# Patient Record
Sex: Male | Born: 1950 | Race: White | Hispanic: No | Marital: Married | Smoking: Former smoker
Health system: Southern US, Community
[De-identification: ages and names within clinical notes are randomized; demographics above are authoritative.]

## PROBLEM LIST (undated history)

## (undated) DIAGNOSIS — R0600 Dyspnea, unspecified: Secondary | ICD-10-CM

## (undated) DIAGNOSIS — E785 Hyperlipidemia, unspecified: Secondary | ICD-10-CM

## (undated) DIAGNOSIS — H9319 Tinnitus, unspecified ear: Secondary | ICD-10-CM

## (undated) DIAGNOSIS — M1990 Unspecified osteoarthritis, unspecified site: Secondary | ICD-10-CM

## (undated) HISTORY — DX: Hyperlipidemia, unspecified: E78.5

## (undated) HISTORY — DX: Tinnitus, unspecified ear: H93.19

## (undated) HISTORY — PX: EYE SURGERY: SHX253

---

## 2019-03-19 ENCOUNTER — Ambulatory Visit: Payer: Medicare Other | Attending: Internal Medicine

## 2019-03-19 ENCOUNTER — Ambulatory Visit: Payer: Self-pay

## 2019-03-19 DIAGNOSIS — Z23 Encounter for immunization: Secondary | ICD-10-CM | POA: Insufficient documentation

## 2019-03-19 NOTE — Progress Notes (Signed)
   Covid-19 Vaccination Clinic  Name:  Paul Roth    MRN: [id number] DOB: 30-Nov-1950  03/19/2019  Paul Roth was observed post Covid-19 immunization for 15 minutes without incidence. He was provided with Vaccine Information Sheet and instruction to access the V-Safe system.   Paul Roth was instructed to call 911 with any severe reactions post vaccine: Marland Kitchen Difficulty breathing  . Swelling of your face and throat  . A fast heartbeat  . A bad rash all over your body  . Dizziness and weakness    Immunizations Administered    Name Date Dose VIS Date Route   Pfizer COVID-19 Vaccine 03/19/2019  9:30 AM 0.3 mL 01/14/2019 Intramuscular   Manufacturer: [place]   Lot: [id number]   [place]: [address]-8

## 2019-04-10 ENCOUNTER — Ambulatory Visit: Payer: Medicare Other

## 2019-04-12 ENCOUNTER — Ambulatory Visit: Payer: Medicare Other | Attending: Internal Medicine

## 2019-04-12 DIAGNOSIS — Z23 Encounter for immunization: Secondary | ICD-10-CM | POA: Insufficient documentation

## 2019-04-12 NOTE — Progress Notes (Signed)
   Covid-19 Vaccination Clinic  Name:  Lealon Vanputten    MRN: [id number] DOB: 1950/02/15  04/12/2019  Mr. Macke was observed post Covid-19 immunization for 15 minutes without incident. He was provided with Vaccine Information Sheet and instruction to access the V-Safe system.   Mr. Jares was instructed to call 911 with any severe reactions post vaccine: Marland Kitchen Difficulty breathing  . Swelling of face and throat  . A fast heartbeat  . A bad rash all over body  . Dizziness and weakness   Immunizations Administered    Name Date Dose VIS Date Route   Pfizer COVID-19 Vaccine 04/12/2019  5:43 PM 0.3 mL 01/14/2019 Intramuscular   Manufacturer: [place]   Lot: [id number]   [place]: [address]-7

## 2020-07-04 ENCOUNTER — Other Ambulatory Visit: Payer: Self-pay | Admitting: Orthopedic Surgery

## 2020-07-04 DIAGNOSIS — Z01811 Encounter for preprocedural respiratory examination: Secondary | ICD-10-CM

## 2020-07-17 ENCOUNTER — Other Ambulatory Visit: Payer: Self-pay

## 2020-07-17 ENCOUNTER — Ambulatory Visit (INDEPENDENT_AMBULATORY_CARE_PROVIDER_SITE_OTHER): Payer: Medicare Other | Admitting: Internal Medicine

## 2020-07-17 ENCOUNTER — Encounter: Payer: Self-pay | Admitting: Internal Medicine

## 2020-07-17 VITALS — BP 108/70 | HR 68 | Temp 97.2°F | Ht 74.0 in | Wt 189.2 lb

## 2020-07-17 DIAGNOSIS — R0602 Shortness of breath: Secondary | ICD-10-CM

## 2020-07-17 DIAGNOSIS — Z87891 Personal history of nicotine dependence: Secondary | ICD-10-CM | POA: Diagnosis not present

## 2020-07-17 DIAGNOSIS — J849 Interstitial pulmonary disease, unspecified: Secondary | ICD-10-CM | POA: Diagnosis not present

## 2020-07-17 NOTE — Patient Instructions (Signed)
The patient should have follow up scheduled with myself in 1 months.   Prior to next visit patient should have: Full set of PFTs, 1 hour CT scan of chest

## 2020-07-17 NOTE — Progress Notes (Signed)
Paul Roth    [id number]    [birth year]  Primary Care Physician:Bouska, Shanon Brow, MD  Referring Physician: Bernerd Limbo, MD [address],  [place] [address] Reason for Consultation: shortness of breath Date of Consultation: 07/17/2020  Chief complaint:   Chief Complaint  Patient presents with   Consult    Shortness of breath with exertion for past 5 years, worse over last year.      HPI:  Has had long standing shortness of breath which has worsened over the last 5 years. His exercise tolerance has gone down, he is unable to go running. Denies coughing but has chronic nasal drainage and occasional morning cough.   He has never had pneumonia or bronchitis, never been hospitalized for his breathing.  No fevers, chills, weight loss. Had night sweats once earlier this week but that was a particularly active day.   Is having right should replacement next week.   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   Mother had lung disease - was on oxygen smoked cigarettes and had recurrent pneumonia. ?copd? pneumothoraces Maternal aunt - copd, pneumothoraces No family history of autoimmune diseases  ILD REVIEW OF SYSTEMS No exposure to asbestos, silica or other organic allergens    -pt worked see below No birds or chickens No water damage or mold exposure in home No hot tub at home No arthralgias or myalgias Denies skin rash or lesions Denies nail changes or finger splitting + Raynaud's Denies dry eyes  +dry mouth Denies dysphagia +GERD +poor sleep PPD negative and no h/o exposure No h/o chemo/XRT/amiodarone/macrodantin/MTX Originally from [place], has lived in [place] since 1974   Denies lightheadedness, syncope or h/o seizure Denies vision changes Denies atopy or sinusitis Denies excessive thirst or urination. No h/o DM Denies wt change or heat or cold intolerance. 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  Exposures: Programmer, systems, wife Smoking history: 35 pack years  Social History   Occupational History   Not on file  Tobacco Use   Smoking status: Former    Packs/day: 1.00    Years: 35.00    Pack years: 35.00    Types: Cigarettes    Quit date: 1990    Years since quitting: 32.4   Smokeless tobacco: Never  Vaping Use   Vaping Use: Never used  Substance and Sexual Activity   Alcohol use: Not Currently   Drug use: Never   Sexual activity: Not on file    Relevant family history:  Family History  Problem Relation Age of Onset   Lung disease Mother    Lung disease Maternal Aunt     Past Medical History:  Diagnosis Date   Hyperlipidemia    Tinnitus     History reviewed. No pertinent surgical history.   Physical Exam: Blood pressure 108/70, pulse 68, temperature (!) 97.2 F (36.2 C), temperature source Temporal, height 6\' 2"  (1.88 m), weight 189 lb 3.2 oz (85.8 kg), SpO2 99 %. Gen:      No acute distress ENT:  no nasal polyps, mucus membranes moist Lungs:    No increased respiratory effort, symmetric chest wall excursion, clear to auscultation bilaterally, no wheezes or crackles  CV:         Regular rate and rhythm; no murmurs, rubs, or gallops.  No pedal edema Abd:      + bowel sounds; soft, non-tender; no distension MSK: no acute synovitis of DIP or PIP joints, no mechanics hands.  Skin:      Warm and dry; no rashes Neuro: normal speech, no focal facial asymmetry Psych: alert and oriented x3, normal mood and affect   Data Reviewed/Medical Decision Making:  Independent interpretation of tests: Imaging: - report of chest xray with fibrosis  PFTs: None available, he had spirometry  done with PCP but I don't have the results No flowsheet data found.  Labs: =CMP June 2021 shows Cr 1.1, Na 143, K 4.9 AST ALT wnl CBC June 2021 shows Hgb 15.7, Plt 222    Immunization status:  Immunization History  Administered Date(s) Administered   Influenza Whole 11/27/2012   Influenza-Unspecified 11/12/2018   PFIZER Comirnaty(Gray Top)Covid-19 Tri-Sucrose Vaccine 05/18/2020   PFIZER(Purple Top)SARS-COV-2 Vaccination 03/19/2019, 04/12/2019, 11/01/2019   Pneumococcal Conjugate-13 07/06/2019   Pneumococcal Polysaccharide-23 11/27/2012   Zoster, Live 11/27/2012     I reviewed prior external note(s) from PCP  I reviewed the result(s) of the labs and imaging as noted above.   I have ordered CT Chest, PFTs  Assessment:  Shortness of breath History of tobacco use  Plan/Recommendations:  Ok to proceed with shoulder surgery that is scheduled. I am concerned about this report of abnormal Chest xray with symptoms of worsening dyspnea. Differential includes smoking related lung disease, pulmonary fibrosis. Will proceed with PFT and CT Chest for further evaluation. I will see him back after this.   We discussed disease management and progression at length today.     Return to Care: Return in about 4 weeks (around 08/14/2020).  Lenice Llamas, MD Pulmonary and [place]  CC: Bernerd Limbo, MD

## 2020-07-18 NOTE — Progress Notes (Signed)
DUE TO COVID-19 ONLY ONE VISITOR IS ALLOWED TO COME WITH YOU AND STAY IN THE WAITING ROOM ONLY DURING PRE OP AND PROCEDURE DAY OF SURGERY. THE 1 VISITOR  MAY VISIT WITH YOU AFTER SURGERY IN YOUR PRIVATE ROOM DURING VISITING HOURS ONLY!  YOU NEED TO HAVE A COVID 19 TEST ON_______ @_______ , THIS TEST MUST BE DONE BEFORE SURGERY,  COVID TESTING SITE [address] [place] [address], IT IS ON THE RIGHT GOING OUT [address] APPROXIMATELY  2 MINUTES PAST [employer] ON THE RIGHT. ONCE YOUR COVID TEST IS COMPLETED,  PLEASE BEGIN THE QUARANTINE INSTRUCTIONS AS OUTLINED IN YOUR HANDOUT.                Paul Roth  07/18/2020   Your procedure is scheduled on:            07/26/20   Report to [facility] Main  Entrance   Report to admitting at     [place] AM     Call this number if you have problems the morning of surgery [phone]    REMEMBER: NO  SOLID FOOD CANDY OR GUM AFTER MIDNIGHT. CLEAR LIQUIDS UNTIL    0750am         . NOTHING BY MOUTH EXCEPT CLEAR LIQUIDS UNTIL    0750am    . PLEASE FINISH ENSURE DRINK PER SURGEON ORDER  WHICH NEEDS TO BE COMPLETED AT  0750am     .      CLEAR LIQUID DIET   Foods Allowed                                                                    Coffee and tea, regular and decaf                            Fruit ices (not with fruit pulp)                                      Iced Popsicles                                    Carbonated beverages, regular and diet                                    Cranberry, grape and apple juices Sports drinks like Gatorade Lightly seasoned clear broth or consume(fat free) Sugar, honey syrup ___________________________________________________________________      BRUSH YOUR TEETH MORNING OF SURGERY AND RINSE YOUR MOUTH OUT, NO CHEWING GUM CANDY OR MINTS.     Take these medicines the morning of surgery with A SIP OF WATER:   Eye drops as usual  DO NOT TAKE ANY DIABETIC MEDICATIONS DAY  OF YOUR SURGERY                               You may not have any metal on your body including  hair pins and              piercings  Do not wear jewelry, make-up, lotions, powders or perfumes, deodorant             Do not wear nail polish on your fingernails.  Do not shave  48 hours prior to surgery.              Men may shave face and neck.   Do not bring valuables to the hospital. [place].  Contacts, dentures or bridgework may not be worn into surgery.  Leave suitcase in the car. After surgery it may be brought to your room.     Patients discharged the day of surgery will not be allowed to drive home. IF YOU ARE HAVING SURGERY AND GOING HOME THE SAME DAY, YOU MUST HAVE AN ADULT TO DRIVE YOU HOME AND BE WITH YOU FOR 24 HOURS. YOU MAY GO HOME BY TAXI OR UBER OR ORTHERWISE, BUT AN ADULT MUST ACCOMPANY YOU HOME AND STAY WITH YOU FOR 24 HOURS.  Name and phone number of your driver:  Special Instructions: N/A              Please read over the following fact sheets you were given: _____________________________________________________________________  [facility] - Preparing for Surgery Before surgery, you can play an important role.  Because skin is not sterile, your skin needs to be as free of germs as possible.  You can reduce the number of germs on your skin by washing with CHG (chlorahexidine gluconate) soap before surgery.  CHG is an antiseptic cleaner which kills germs and bonds with the skin to continue killing germs even after washing. Please DO NOT use if you have an allergy to CHG or antibacterial soaps.  If your skin becomes reddened/irritated stop using the CHG and inform your nurse when you arrive at Short Stay. Do not shave (including legs and underarms) for at least 48 hours prior to the first CHG shower.  You may shave your face/neck. Please follow these instructions carefully:  1.  Shower with CHG Soap the night before surgery  and the  morning of Surgery.  2.  If you choose to wash your hair, wash your hair first as usual with your  normal  shampoo.  3.  After you shampoo, rinse your hair and body thoroughly to remove the  shampoo.                           4.  Use CHG as you would any other liquid soap.  You can apply chg directly  to the skin and wash                       Gently with a scrungie or clean washcloth.  5.  Apply the CHG Soap to your body ONLY FROM THE NECK DOWN.   Do not use on face/ open                           Wound or open sores. Avoid contact with eyes, ears mouth and genitals (private parts).                       Wash face,  Genitals (private parts) with  your normal soap.             6.  Wash thoroughly, paying special attention to the area where your surgery  will be performed.  7.  Thoroughly rinse your body with warm water from the neck down.  8.  DO NOT shower/wash with your normal soap after using and rinsing off  the CHG Soap.                9.  Pat yourself dry with a clean towel.            10.  Wear clean pajamas.            11.  Place clean sheets on your bed the night of your first shower and do not  sleep with pets. Day of Surgery : Do not apply any lotions/deodorants the morning of surgery.  Please wear clean clothes to the hospital/surgery center.  FAILURE TO FOLLOW THESE INSTRUCTIONS MAY RESULT IN THE CANCELLATION OF YOUR SURGERY PATIENT SIGNATURE_________________________________  NURSE SIGNATURE__________________________________  ________________________________________________________________________              [facility]- Preparing for Total Shoulder Arthroplasty    Before surgery, you can play an important role. Because skin is not sterile, your skin needs to be as free of germs as possible. You can reduce the number of germs on your skin by using the following products. Benzoyl Peroxide Gel Reduces the number of germs present on the skin Applied twice a day to  shoulder area starting two days before surgery    ==================================================================  Please follow these instructions carefully:  BENZOYL PEROXIDE 5% GEL  Please do not use if you have an allergy to benzoyl peroxide.   If your skin becomes reddened/irritated stop using the benzoyl peroxide.  Starting two days before surgery, apply as follows: Apply benzoyl peroxide in the morning and at night. Apply after taking a shower. If you are not taking a shower clean entire shoulder front, back, and side along with the armpit with a clean wet washcloth.  Place a quarter-sized dollop on your shoulder and rub in thoroughly, making sure to cover the front, back, and side of your shoulder, along with the armpit.   2 days before ____ AM   ____ PM              1 day before ____ AM   ____ PM                         Do this twice a day for two days.  (Last application is the night before surgery, AFTER using the CHG soap as described below).  Do NOT apply benzoyl peroxide gel on the day of surgery.

## 2020-07-20 ENCOUNTER — Other Ambulatory Visit: Payer: Self-pay

## 2020-07-20 ENCOUNTER — Encounter (HOSPITAL_COMMUNITY): Payer: Self-pay

## 2020-07-20 ENCOUNTER — Encounter (HOSPITAL_COMMUNITY)
Admission: RE | Admit: 2020-07-20 | Discharge: 2020-07-20 | Disposition: A | Discharge status: Home / Self Care | Payer: Medicare Other | Source: Ambulatory Visit | Attending: Orthopedic Surgery | Admitting: Orthopedic Surgery

## 2020-07-20 ENCOUNTER — Ambulatory Visit (HOSPITAL_COMMUNITY)
Admission: RE | Admit: 2020-07-20 | Discharge: 2020-07-20 | Disposition: A | Discharge status: Home / Self Care | Payer: Medicare Other | Source: Ambulatory Visit | Attending: Orthopedic Surgery | Admitting: Orthopedic Surgery

## 2020-07-20 DIAGNOSIS — Z87891 Personal history of nicotine dependence: Secondary | ICD-10-CM | POA: Diagnosis not present

## 2020-07-20 DIAGNOSIS — M19011 Primary osteoarthritis, right shoulder: Secondary | ICD-10-CM | POA: Diagnosis not present

## 2020-07-20 DIAGNOSIS — Z79899 Other long term (current) drug therapy: Secondary | ICD-10-CM | POA: Insufficient documentation

## 2020-07-20 DIAGNOSIS — Z01811 Encounter for preprocedural respiratory examination: Secondary | ICD-10-CM

## 2020-07-20 DIAGNOSIS — Z01818 Encounter for other preprocedural examination: Secondary | ICD-10-CM | POA: Insufficient documentation

## 2020-07-20 HISTORY — DX: Unspecified osteoarthritis, unspecified site: M19.90

## 2020-07-20 HISTORY — DX: Dyspnea, unspecified: R06.00

## 2020-07-20 LAB — SURGICAL PCR SCREEN
MRSA, PCR: NEGATIVE
Staphylococcus aureus: POSITIVE — AB

## 2020-07-20 LAB — COMPREHENSIVE METABOLIC PANEL
ALT: 24 U/L (ref 0–44)
AST: 22 U/L (ref 15–41)
Albumin: 3.6 g/dL (ref 3.5–5.0)
Alkaline Phosphatase: 55 U/L (ref 38–126)
Anion gap: 8 (ref 5–15)
BUN: 13 mg/dL (ref 8–23)
CO2: 29 mmol/L (ref 22–32)
Calcium: 9.5 mg/dL (ref 8.9–10.3)
Chloride: 100 mmol/L (ref 98–111)
Creatinine, Ser: 1.19 mg/dL (ref 0.61–1.24)
GFR, Estimated: >60 mL/min (ref >60)
Glucose, Bld: 102 mg/dL — ABNORMAL HIGH (ref 70–99)
Potassium: 4.2 mmol/L (ref 3.5–5.1)
Sodium: 137 mmol/L (ref 135–145)
Total Bilirubin: 0.6 mg/dL (ref 0.3–1.2)
Total Protein: 6.9 g/dL (ref 6.5–8.1)

## 2020-07-20 LAB — APTT: aPTT: 34 seconds (ref 24–36)

## 2020-07-20 NOTE — Progress Notes (Addendum)
Anesthesia Review:  PCP: DR Bernerd Limbo LOv 07/09/20 - cleared for surgery  Cardiologist : none  Pulmonary- Dr Shearon Stalls - 07/16/2020- clearance fo rsurgery  Has CT of chest scheduled for 08/08/20  Chest x-ray : EKG : 07/09/20 requestd 12 lead ekg tracing from office of DR Coletta Memos by fax  Echo : Stress test: Cardiac Cath :  Activity level: can do a flight of stairs slowly per pt  Sleep Study/ CPAP : none  Fasting Blood Sugar :      / Checks Blood Sugar -- times a day:   Blood Thinner/ Instructions /Last Dose: ASA / Instructions/ Last Dose :   SOB with exertion  per pt  CBc/Diff and U/A done on 07/09/20 in epic.

## 2020-07-24 NOTE — Progress Notes (Signed)
Anesthesia Chart Review   Case: [id number] Date/Time: 07/26/20 1051   Procedure: TOTAL SHOULDER ARTHROPLASTY (Right: Shoulder)   Anesthesia type: Choice   Pre-op diagnosis: RIGHT SHOULDER ARTHRITIS   Location: [facility] ROOM 07 / [facility]   Surgeons: Tania Ade, MD       DISCUSSION:69 y.o. former smoker with h/o right shoulder arthritis scheduled for above procedure 07/26/2020 with Dr. Tania Ade.   Pt seen by pulmonology for evaluation of chronic shortness of breath.  Per pulmonology note, "Ok to proceed with shoulder surgery that is scheduled."  Cleared by PCP 07/09/2020.  VS: BP 128/83   Pulse 83   Temp 36.9 C (Oral)   Resp 16   Ht 6\' 1"  (1.854 m)   Wt 82.1 kg   SpO2 98%   BMI 23.88 kg/m   PROVIDERS: Bernerd Limbo, MD is PCP   Lenice Llamas, MD is Pulmonologist  LABS: Labs reviewed: Acceptable for surgery. (all labs ordered are listed, but only abnormal results are displayed)  Labs Reviewed  SURGICAL PCR SCREEN - Abnormal; Notable for the following components:      Result Value   Staphylococcus aureus POSITIVE (*)    All other components within normal limits  COMPREHENSIVE METABOLIC PANEL - Abnormal; Notable for the following components:   Glucose, Bld 102 (*)    All other components within normal limits  APTT  TYPE AND SCREEN     IMAGES: Chest Xray 07/20/2020 IMPRESSION: 1. Chronic changes versus less likely developing infiltrate in the left upper lobe. 2. Focal area of somewhat nodular density along the lateral right pleural surface.  EKG:   CV:  Past Medical History:  Diagnosis Date   Arthritis    Dyspnea    Hyperlipidemia    Tinnitus     Past Surgical History:  Procedure Laterality Date   EYE SURGERY      MEDICATIONS:  ascorbic acid (VITAMIN C) 100 MG tablet   atorvastatin (LIPITOR) 10 MG tablet   MAGNESIUM CITRATE PO   meloxicam (MOBIC) 15 MG tablet   nortriptyline (PAMELOR) 75 MG capsule   prednisoLONE acetate (PRED FORTE) 1 %  ophthalmic suspension   VITAMIN D, CHOLECALCIFEROL, PO   No current facility-administered medications for this encounter.    Konrad Felix, PA-C [facility] Pre-Surgical Testing [phone]

## 2020-07-26 ENCOUNTER — Encounter (HOSPITAL_COMMUNITY): Payer: Self-pay | Admitting: Orthopedic Surgery

## 2020-07-26 ENCOUNTER — Ambulatory Visit (HOSPITAL_COMMUNITY)
Admission: RE | Admit: 2020-07-26 | Discharge: 2020-07-26 | Disposition: A | Discharge status: Home / Self Care | Payer: Medicare Other | Attending: Orthopedic Surgery | Admitting: Orthopedic Surgery

## 2020-07-26 ENCOUNTER — Ambulatory Visit (HOSPITAL_COMMUNITY): Payer: Medicare Other | Admitting: Anesthesiology

## 2020-07-26 ENCOUNTER — Encounter (HOSPITAL_COMMUNITY)
Admission: RE | Disposition: A | Discharge status: Home / Self Care | Payer: Self-pay | Source: Home / Self Care | Attending: Orthopedic Surgery

## 2020-07-26 ENCOUNTER — Ambulatory Visit (HOSPITAL_COMMUNITY): Payer: Medicare Other

## 2020-07-26 ENCOUNTER — Ambulatory Visit (HOSPITAL_COMMUNITY): Payer: Medicare Other | Admitting: Physician Assistant

## 2020-07-26 DIAGNOSIS — M25711 Osteophyte, right shoulder: Secondary | ICD-10-CM | POA: Diagnosis not present

## 2020-07-26 DIAGNOSIS — Z791 Long term (current) use of non-steroidal anti-inflammatories (NSAID): Secondary | ICD-10-CM | POA: Diagnosis not present

## 2020-07-26 DIAGNOSIS — Z79899 Other long term (current) drug therapy: Secondary | ICD-10-CM | POA: Diagnosis not present

## 2020-07-26 DIAGNOSIS — Z96612 Presence of left artificial shoulder joint: Secondary | ICD-10-CM

## 2020-07-26 DIAGNOSIS — M19011 Primary osteoarthritis, right shoulder: Secondary | ICD-10-CM | POA: Insufficient documentation

## 2020-07-26 DIAGNOSIS — Z87891 Personal history of nicotine dependence: Secondary | ICD-10-CM | POA: Diagnosis not present

## 2020-07-26 HISTORY — PX: TOTAL SHOULDER ARTHROPLASTY: SHX126

## 2020-07-26 LAB — CBC WITH DIFFERENTIAL/PLATELET
Abs Immature Granulocytes: 0.04 10*3/uL (ref 0.00–0.07)
Basophils Absolute: 0.1 10*3/uL (ref 0.0–0.1)
Basophils Relative: 1 %
Eosinophils Absolute: 0.4 10*3/uL (ref 0.0–0.5)
Eosinophils Relative: 4 %
HCT: 47.2 % (ref 39.0–52.0)
Hemoglobin: 15.7 g/dL (ref 13.0–17.0)
Immature Granulocytes: 0 %
Lymphocytes Relative: 32 %
Lymphs Abs: 3 10*3/uL (ref 0.7–4.0)
MCH: 31.7 pg (ref 26.0–34.0)
MCHC: 33.3 g/dL (ref 30.0–36.0)
MCV: 95.4 fL (ref 80.0–100.0)
Monocytes Absolute: 1.1 10*3/uL — ABNORMAL HIGH (ref 0.1–1.0)
Monocytes Relative: 12 %
Neutro Abs: 4.9 10*3/uL (ref 1.7–7.7)
Neutrophils Relative %: 51 %
Platelets: 230 10*3/uL (ref 150–400)
RBC: 4.95 MIL/uL (ref 4.22–5.81)
RDW: 13.2 % (ref 11.5–15.5)
WBC: 9.6 10*3/uL (ref 4.0–10.5)
nRBC: 0 % (ref 0.0–0.2)

## 2020-07-26 LAB — TYPE AND SCREEN
ABO/RH(D): A POS
Antibody Screen: NEGATIVE

## 2020-07-26 LAB — ABO/RH: ABO/RH(D): A POS

## 2020-07-26 SURGERY — ARTHROPLASTY, SHOULDER, TOTAL
Anesthesia: Regional | Site: Shoulder | Laterality: Right

## 2020-07-26 MED ORDER — FENTANYL CITRATE (PF) 100 MCG/2ML IJ SOLN
50.0000 ug | Freq: Once | INTRAMUSCULAR | Status: AC
  Administered 2020-07-26: 50 ug via INTRAVENOUS
  Filled 2020-07-26: qty 2

## 2020-07-26 MED ORDER — WATER FOR IRRIGATION, STERILE IR SOLN
Status: DC | PRN
  Administered 2020-07-26: 2000 mL

## 2020-07-26 MED ORDER — LIDOCAINE 2% (20 MG/ML) 5 ML SYRINGE
INTRAMUSCULAR | Status: AC
  Filled 2020-07-26: qty 5

## 2020-07-26 MED ORDER — DEXAMETHASONE SODIUM PHOSPHATE 10 MG/ML IJ SOLN
INTRAMUSCULAR | Status: AC
  Filled 2020-07-26: qty 1

## 2020-07-26 MED ORDER — MIDAZOLAM HCL 2 MG/2ML IJ SOLN
1.0000 mg | Freq: Once | INTRAMUSCULAR | Status: AC
  Administered 2020-07-26: 2 mg via INTRAVENOUS
  Filled 2020-07-26: qty 2

## 2020-07-26 MED ORDER — 0.9 % SODIUM CHLORIDE (POUR BTL) OPTIME
TOPICAL | Status: DC | PRN
  Administered 2020-07-26: 1000 mL

## 2020-07-26 MED ORDER — TRANEXAMIC ACID-NACL 1000-0.7 MG/100ML-% IV SOLN
1000.0000 mg | INTRAVENOUS | Status: AC
  Administered 2020-07-26: 1000 mg via INTRAVENOUS
  Filled 2020-07-26: qty 100

## 2020-07-26 MED ORDER — FENTANYL CITRATE (PF) 100 MCG/2ML IJ SOLN
INTRAMUSCULAR | Status: DC | PRN
  Administered 2020-07-26 (×2): 25 ug via INTRAVENOUS
  Administered 2020-07-26: 50 ug via INTRAVENOUS

## 2020-07-26 MED ORDER — SUGAMMADEX SODIUM 200 MG/2ML IV SOLN
INTRAVENOUS | Status: DC | PRN
  Administered 2020-07-26: 200 mg via INTRAVENOUS

## 2020-07-26 MED ORDER — PROPOFOL 10 MG/ML IV BOLUS
INTRAVENOUS | Status: DC | PRN
  Administered 2020-07-26: 140 mg via INTRAVENOUS

## 2020-07-26 MED ORDER — ROCURONIUM BROMIDE 10 MG/ML (PF) SYRINGE
PREFILLED_SYRINGE | INTRAVENOUS | Status: DC | PRN
  Administered 2020-07-26: 60 mg via INTRAVENOUS

## 2020-07-26 MED ORDER — ACETAMINOPHEN 500 MG PO TABS
1000.0000 mg | ORAL_TABLET | Freq: Once | ORAL | Status: AC
  Administered 2020-07-26: 1000 mg via ORAL
  Filled 2020-07-26: qty 2

## 2020-07-26 MED ORDER — CEFAZOLIN SODIUM-DEXTROSE 2-4 GM/100ML-% IV SOLN
2.0000 g | INTRAVENOUS | Status: AC
  Administered 2020-07-26: 2 g via INTRAVENOUS
  Filled 2020-07-26: qty 100

## 2020-07-26 MED ORDER — ONDANSETRON HCL 4 MG/2ML IJ SOLN
INTRAMUSCULAR | Status: DC | PRN
  Administered 2020-07-26: 4 mg via INTRAVENOUS

## 2020-07-26 MED ORDER — TIZANIDINE HCL 2 MG PO TABS: 2.0000 mg | ORAL_TABLET | Freq: Three times a day (TID) | ORAL | 0 refills | Status: DC | PRN

## 2020-07-26 MED ORDER — LACTATED RINGERS IV SOLN: INTRAVENOUS | Status: DC | PRN

## 2020-07-26 MED ORDER — LIDOCAINE 2% (20 MG/ML) 5 ML SYRINGE
INTRAMUSCULAR | Status: DC | PRN
  Administered 2020-07-26: 60 mg via INTRAVENOUS

## 2020-07-26 MED ORDER — BUPIVACAINE HCL (PF) 0.5 % IJ SOLN
INTRAMUSCULAR | Status: DC | PRN
  Administered 2020-07-26: 15 mL via PERINEURAL

## 2020-07-26 MED ORDER — SODIUM CHLORIDE 0.9 % IR SOLN
Status: DC | PRN
  Administered 2020-07-26: 1000 mL

## 2020-07-26 MED ORDER — BUPIVACAINE LIPOSOME 1.3 % IJ SUSP
INTRAMUSCULAR | Status: DC | PRN
  Administered 2020-07-26: 10 mL via PERINEURAL

## 2020-07-26 MED ORDER — ROCURONIUM BROMIDE 10 MG/ML (PF) SYRINGE
PREFILLED_SYRINGE | INTRAVENOUS | Status: AC
  Filled 2020-07-26: qty 10

## 2020-07-26 MED ORDER — ONDANSETRON HCL 4 MG/2ML IJ SOLN
INTRAMUSCULAR | Status: AC
  Filled 2020-07-26: qty 2

## 2020-07-26 MED ORDER — FENTANYL CITRATE (PF) 100 MCG/2ML IJ SOLN: 25.0000 ug | INTRAMUSCULAR | Status: DC | PRN

## 2020-07-26 MED ORDER — OXYCODONE-ACETAMINOPHEN 5-325 MG PO TABS: 1.0000 | ORAL_TABLET | Freq: Four times a day (QID) | ORAL | 0 refills | Status: AC | PRN

## 2020-07-26 MED ORDER — DEXAMETHASONE SODIUM PHOSPHATE 10 MG/ML IJ SOLN
INTRAMUSCULAR | Status: DC | PRN
  Administered 2020-07-26: 10 mg via INTRAVENOUS

## 2020-07-26 MED ORDER — FENTANYL CITRATE (PF) 100 MCG/2ML IJ SOLN
INTRAMUSCULAR | Status: AC
  Filled 2020-07-26: qty 2

## 2020-07-26 SURGICAL SUPPLY — 71 items
AID PSTN UNV HD RSTRNT DISP (MISCELLANEOUS) ×1
BAG SPEC THK2 15X12 ZIP CLS (MISCELLANEOUS) ×1
BAG ZIPLOCK 12X15 (MISCELLANEOUS) ×3 IMPLANT
BIT DRILL 1.6MX128 (BIT) ×2 IMPLANT
BIT DRILL 1.6MX128MM (BIT) ×1
BLADE SAW SAG 73X25 THK (BLADE) ×2
BLADE SAW SGTL 73X25 THK (BLADE) ×1 IMPLANT
CEMENT BONE DEPUY (Cement) ×3 IMPLANT
CLOSURE STERI-STRIP 1/2X4 (GAUZE/BANDAGES/DRESSINGS) ×1
CLOSURE WOUND 1/2 X4 (GAUZE/BANDAGES/DRESSINGS) ×1
CLSR STERI-STRIP ANTIMIC 1/2X4 (GAUZE/BANDAGES/DRESSINGS) ×1 IMPLANT
COOLER ICEMAN CLASSIC (MISCELLANEOUS) IMPLANT
COVER BACK TABLE 60X90IN (DRAPES) ×3 IMPLANT
COVER SURGICAL LIGHT HANDLE (MISCELLANEOUS) ×3 IMPLANT
COVER WAND RF STERILE (DRAPES) IMPLANT
DRAPE INCISE IOBAN 66X45 STRL (DRAPES) ×3 IMPLANT
DRAPE ORTHO SPLIT 77X108 STRL (DRAPES) ×6
DRAPE POUCH INSTRU U-SHP 10X18 (DRAPES) ×3 IMPLANT
DRAPE SURG 17X11 SM STRL (DRAPES) ×3 IMPLANT
DRAPE SURG ORHT 6 SPLT 77X108 (DRAPES) ×2 IMPLANT
DRAPE TOP 10253 STERILE (DRAPES) ×3 IMPLANT
DRAPE U-SHAPE 47X51 STRL (DRAPES) ×3 IMPLANT
DRSG AQUACEL AG ADV 3.5X 6 (GAUZE/BANDAGES/DRESSINGS) ×3 IMPLANT
DURAPREP 26ML APPLICATOR (WOUND CARE) ×6 IMPLANT
ELECT BLADE TIP CTD 4 INCH (ELECTRODE) ×5 IMPLANT
ELECT REM PT RETURN 15FT ADLT (MISCELLANEOUS) ×3 IMPLANT
GLENOID PEGGED CORTILOC M40 (Orthopedic Implant) IMPLANT
GLOVE SRG 8 PF TXTR STRL LF DI (GLOVE) ×1 IMPLANT
GLOVE SURG ENC MOIS LTX SZ7 (GLOVE) ×3 IMPLANT
GLOVE SURG ENC MOIS LTX SZ7.5 (GLOVE) ×3 IMPLANT
GLOVE SURG UNDER POLY LF SZ7 (GLOVE) ×3 IMPLANT
GLOVE SURG UNDER POLY LF SZ8 (GLOVE) ×3
GOWN STRL REUS W/TWL LRG LVL3 (GOWN DISPOSABLE) ×3 IMPLANT
GOWN STRL REUS W/TWL XL LVL3 (GOWN DISPOSABLE) ×3 IMPLANT
GUIDEWIRE GLENOID 2.5X220 (WIRE) ×2 IMPLANT
HANDPIECE INTERPULSE COAX TIP (DISPOSABLE) ×3
HEAD HUM AEQUALIS 54X4X23 (Head) ×2 IMPLANT
HEMOSTAT SURGICEL 2X14 (HEMOSTASIS) ×3 IMPLANT
HOOD PEEL AWAY FLYTE STAYCOOL (MISCELLANEOUS) ×9 IMPLANT
HUMERAL STEM AEQUALIS 3X74 (Shoulder) ×3 IMPLANT
KIT BASIN OR (CUSTOM PROCEDURE TRAY) ×3 IMPLANT
KIT TURNOVER KIT A (KITS) ×3 IMPLANT
MANIFOLD NEPTUNE II (INSTRUMENTS) ×3 IMPLANT
NDL TROCAR POINT SZ 2 1/2 (NEEDLE) ×1 IMPLANT
NEEDLE TROCAR POINT SZ 2 1/2 (NEEDLE) ×3 IMPLANT
NS IRRIG 1000ML POUR BTL (IV SOLUTION) ×3 IMPLANT
PACK SHOULDER (CUSTOM PROCEDURE TRAY) ×3 IMPLANT
PAD COLD SHLDR WRAP-ON (PAD) IMPLANT
PEGGED GLENOID CORTILOC (Orthopedic Implant) ×2 IMPLANT
PROTECTOR NERVE ULNAR (MISCELLANEOUS) IMPLANT
RESTRAINT HEAD UNIVERSAL NS (MISCELLANEOUS) ×3 IMPLANT
RETRIEVER SUT HEWSON (MISCELLANEOUS) ×3 IMPLANT
SET HNDPC FAN SPRY TIP SCT (DISPOSABLE) ×1 IMPLANT
SLING ARM FOAM STRAP XLG (SOFTGOODS) ×2 IMPLANT
SLING ARM IMMOBILIZER LRG (SOFTGOODS) IMPLANT
SMARTMIX MINI TOWER (MISCELLANEOUS) ×3
SPONGE LAP 18X18 RF (DISPOSABLE) ×3 IMPLANT
STEM HUMERAL AEQUALIS 3X74 (Shoulder) IMPLANT
STRIP CLOSURE SKIN 1/2X4 (GAUZE/BANDAGES/DRESSINGS) ×2 IMPLANT
SUCTION FRAZIER HANDLE 12FR (TUBING) ×3
SUCTION TUBE FRAZIER 12FR DISP (TUBING) ×1 IMPLANT
SUPPORT WRAP ARM LG (MISCELLANEOUS) ×3 IMPLANT
SUT ETHIBOND 2 V 37 (SUTURE) ×5 IMPLANT
SUT MNCRL AB 4-0 PS2 18 (SUTURE) ×3 IMPLANT
SUT VIC AB 2-0 CT1 27 (SUTURE) ×3
SUT VIC AB 2-0 CT1 TAPERPNT 27 (SUTURE) ×1 IMPLANT
TAPE LABRALWHITE 1.5X36 (TAPE) ×3 IMPLANT
TAPE SUT LABRALTAP WHT/BLK (SUTURE) ×3 IMPLANT
TOWEL OR 17X26 10 PK STRL BLUE (TOWEL DISPOSABLE) ×3 IMPLANT
TOWER SMARTMIX MINI (MISCELLANEOUS) ×1 IMPLANT
WATER STERILE IRR 1000ML POUR (IV SOLUTION) ×3 IMPLANT

## 2020-07-26 NOTE — Progress Notes (Signed)
Assisted Dr. Hulan Fray with right, ultrasound guided, interscalene  block. Side rails up, monitors on throughout procedure. See vital signs in flow sheet. Tolerated Procedure well.

## 2020-07-26 NOTE — H&P (Signed)
Paul Roth is an 70 y.o. male.   Chief Complaint: Right shoulder pain and dysfunction HPI: Right shoulder end-stage osteoarthritis, failed conservative management.  Pain interferes with sleep and quality of life.  Past Medical History:  Diagnosis Date   Arthritis    Dyspnea    Hyperlipidemia    Tinnitus     Past Surgical History:  Procedure Laterality Date   EYE SURGERY      Family History  Problem Relation Age of Onset   Lung disease Mother    Lung disease Maternal Aunt    Social History:  reports that he quit smoking about 32 years ago. His smoking use included cigarettes. He has a 35.00 pack-year smoking history. He has never used smokeless tobacco. He reports current alcohol use. He reports previous drug use. Drug: Marijuana.  Allergies: No Known Allergies  Medications Prior to Admission  Medication Sig Dispense Refill   ascorbic acid (VITAMIN C) 100 MG tablet Take 100 mg by mouth daily.     atorvastatin (LIPITOR) 10 MG tablet Take 10 mg by mouth daily.     MAGNESIUM CITRATE PO Take 1 Scoop by mouth daily.     meloxicam (MOBIC) 15 MG tablet Take 1 tablet by mouth daily.     nortriptyline (PAMELOR) 75 MG capsule Take 75 mg by mouth at bedtime.     prednisoLONE acetate (PRED FORTE) 1 % ophthalmic suspension Place 1 drop into the right eye in the morning and at bedtime.     VITAMIN D, CHOLECALCIFEROL, PO Take 1 tablet by mouth daily.      Results for orders placed or performed during the hospital encounter of 07/26/20 (from the past 48 hour(s))  ABO/Rh     Status: None   Collection Time: 07/26/20  8:30 AM  Result Value Ref Range   ABO/RH(D)      A POS Performed at [facility], [place] [address] [place], [place] [address]    No results found.  Review of Systems  All other systems reviewed and are negative.  Blood pressure 117/85, pulse (!) 59, temperature 98.4 F (36.9 C), temperature source Oral, resp. rate 20, height 6\' 1"  (1.854 m),  weight 82.1 kg, SpO2 100 %. Physical Exam Constitutional:      Appearance: He is well-developed.  HENT:     Head: Atraumatic.  Pulmonary:     Effort: Pulmonary effort is normal.  Musculoskeletal:     Comments: Right shoulder pain with limited range of motion  Skin:    General: Skin is warm and dry.  Neurological:     Mental Status: He is alert and oriented to person, place, and time.     Assessment/Plan Right shoulder end-stage osteoarthritis Plan right shoulder replacement Risks / benefits of surgery discussed Consent on chart  NPO for OR Preop antibiotics   Isabella Stalling, MD 07/26/2020, 9:41 AM

## 2020-07-26 NOTE — Evaluation (Signed)
Occupational Therapy Evaluation Patient Details Name: Paul Roth MRN: [id number] DOB: 1950-08-12 Today's Date: 07/26/2020    History of Present Illness Patient is a 70 year old male s/p R total shoulder arthroplasty. PMH tobacco use   Clinical Impression   Patient is a 70 year old male s/p shoulder replacement without functional use of right dominant upper extremity secondary to effects of surgery, interscalene block and shoulder precautions. Therapist provided education and instruction to patient in regards to exercises, precautions, positioning, donning upper extremity clothing and bathing while maintaining shoulder precautions, ice and edema management and donning/doffing sling. Patient verbalized understanding and demonstrated as needed. Patient needed assistance to donn shirt, underwear, pants, socks and shoes and provided with instruction on compensatory strategies to perform ADLs. Patient to follow up with MD for further therapy needs.      Follow Up Recommendations  Follow surgeon's recommendation for DC plan and follow-up therapies    Equipment Recommendations  None recommended by OT       Precautions / Restrictions Precautions Precautions: Shoulder Type of Shoulder Precautions: AROM elbow, wrist and hand ok. A/PROM shoulder NO Shoulder Interventions: Shoulder sling/immobilizer;Off for dressing/bathing/exercises Precaution Booklet Issued: Yes (comment) Required Braces or Orthoses: Sling Restrictions Weight Bearing Restrictions: Yes RUE Weight Bearing: Non weight bearing      Mobility Bed Mobility               General bed mobility comments: in chair    Transfers Overall transfer level: Independent                    Balance Overall balance assessment: No apparent balance deficits (not formally assessed)                                         ADL either performed or assessed with clinical judgement   ADL Overall ADL's :  Needs assistance/impaired Eating/Feeding: Independent   Grooming: Independent   Upper Body Bathing: Minimal assistance;Sitting   Lower Body Bathing: Independent;Sitting/lateral leans;Sit to/from stand   Upper Body Dressing : Sitting;Standing;Moderate assistance Upper Body Dressing Details (indicate cue type and reason): to don modified shirt with buttons down sleeve Lower Body Dressing: Minimal assistance;Sit to/from stand Lower Body Dressing Details (indicate cue type and reason): to manage button, zipper and belt Toilet Transfer: Independent   Toileting- Clothing Manipulation and Hygiene: Minimal assistance;Sit to/from stand Toileting - Clothing Manipulation Details (indicate cue type and reason): clothing management     Functional mobility during ADLs: Independent       Pertinent Vitals/Pain Pain Assessment: Faces Faces Pain Scale: Hurts a little bit Pain Location: R shoulder Pain Descriptors / Indicators: Numbness;Heaviness Pain Intervention(s): Monitored during session     Hand Dominance Right   Extremity/Trunk Assessment Upper Extremity Assessment Upper Extremity Assessment: RUE deficits/detail RUE Deficits / Details: + nerve block   Lower Extremity Assessment Lower Extremity Assessment: Overall WFL for tasks assessed       Communication Communication Communication: No difficulties   Cognition Arousal/Alertness: Awake/alert Behavior During Therapy: WFL for tasks assessed/performed Overall Cognitive Status: Within Functional Limits for tasks assessed                                        Exercises Exercises: Shoulder   Shoulder Instructions Shoulder Instructions Donning/doffing shirt without  moving shoulder: Moderate assistance;Patient able to independently direct caregiver Method for sponge bathing under operated UE: Minimal assistance;Patient able to independently direct caregiver Donning/doffing sling/immobilizer: Moderate  assistance;Patient able to independently direct caregiver Correct positioning of sling/immobilizer: Patient able to independently direct caregiver Pendulum exercises (written home exercise program):  (N/A) ROM for elbow, wrist and digits of operated UE: Patient able to independently direct caregiver Sling wearing schedule (on at all times/off for ADL's): Patient able to independently direct caregiver Proper positioning of operated UE when showering: Patient able to independently direct caregiver Dressing change:  (N/A) Positioning of UE while sleeping: Patient able to independently direct caregiver    Home Living Family/patient expects to be discharged to:: Private residence Living Arrangements: Spouse/significant other Available Help at Discharge: Family Type of Home: House Home Access: Stairs to enter Technical brewer of Steps: 3-4   Home Layout: One level     Bathroom Shower/Tub: Occupational psychologist: Standard     Home Equipment: Bedside commode          Prior Functioning/Environment Level of Independence: Independent                 OT Problem List: Pain;Impaired UE functional use;Decreased knowledge of precautions         OT Goals(Current goals can be found in the care plan section) Acute Rehab OT Goals Patient Stated Goal: home OT Goal Formulation: All assessment and education complete, DC therapy   AM-PAC OT "6 Clicks" Daily Activity     Outcome Measure Help from another person eating meals?: None Help from another person taking care of personal grooming?: None Help from another person toileting, which includes using toliet, bedpan, or urinal?: A Little Help from another person bathing (including washing, rinsing, drying)?: A Little Help from another person to put on and taking off regular upper body clothing?: A Lot Help from another person to put on and taking off regular lower body clothing?: A Little 6 Click Score: 19   End of  Session Equipment Utilized During Treatment: Other (comment) (sling) Nurse Communication: Other (comment) (OT complete)  Activity Tolerance: Patient tolerated treatment well Patient left: in chair;with family/visitor present  OT Visit Diagnosis: Pain Pain - Right/Left: Right Pain - part of body: Shoulder                Time: 1428-1500 OT Time Calculation (min): 32 min Charges:  OT General Charges $OT Visit: 1 Visit OT Evaluation $OT Eval Low Complexity: 1 Low OT Treatments $Self Care/Home Management : 8-22 mins  Delbert Phenix OT OT pager: [phone]   Rosemary Holms 07/26/2020, 3:08 PM

## 2020-07-26 NOTE — Anesthesia Preprocedure Evaluation (Addendum)
Anesthesia Evaluation  Patient identified by MRN, date of birth, ID band Patient awake    Reviewed: Allergy & Precautions, NPO status , Patient's Chart, lab work & pertinent test results  Airway Mallampati: I  TM Distance: >3 FB Neck ROM: Full    Dental  (+) Chipped, Dental Advisory Given,    Pulmonary neg pulmonary ROS, former smoker,    Pulmonary exam normal breath sounds clear to auscultation       Cardiovascular Normal cardiovascular exam Rhythm:Regular Rate:Normal  HLD   Neuro/Psych negative neurological ROS  negative psych ROS   GI/Hepatic negative GI ROS, Neg liver ROS,   Endo/Other  negative endocrine ROS  Renal/GU negative Renal ROS  negative genitourinary   Musculoskeletal  (+) Arthritis ,   Abdominal   Peds  Hematology negative hematology ROS (+)   Anesthesia Other Findings   Reproductive/Obstetrics                            Anesthesia Physical Anesthesia Plan  ASA: 2  Anesthesia Plan: General and Regional   Post-op Pain Management:  Regional for Post-op pain   Induction: Intravenous  PONV Risk Score and Plan: 2 and Midazolam, Dexamethasone and Ondansetron  Airway Management Planned: Oral ETT  Additional Equipment:   Intra-op Plan:   Post-operative Plan: Extubation in OR  Informed Consent: I have reviewed the patients History and Physical, chart, labs and discussed the procedure including the risks, benefits and alternatives for the proposed anesthesia with the patient or authorized representative who has indicated his/her understanding and acceptance.     Dental advisory given  Plan Discussed with: CRNA  Anesthesia Plan Comments:         Anesthesia Quick Evaluation

## 2020-07-26 NOTE — Discharge Instructions (Addendum)
Discharge Instructions after Total Shoulder Arthroplasty   A sling has been provided for you. You are to wear this at all times (except for bathing, dressing, and doing exercises), until your first post operative visit with Dr. Tamera Punt. Please also wear while sleeping at night. While you bath and dress, let the arm/elbow extend straight down to stretch your elbow. Wiggle your fingers and pump your first while your in the sling to prevent hand swelling. Do your exercises 3x per day as taught by PT Use ice on the shoulder intermittently over the first 48 hours after surgery. Continue to use ice or and ice machine as needed after 48 hours for pain control/swelling.  Pain medicine has been prescribed for you.  Use your medicine liberally over the first 48 hours, and then you can begin to taper your use. You may take Extra Strength Tylenol or Tylenol only in place of the pain pills. DO NOT take ANY nonsteroidal anti-inflammatory pain medications: Advil, Motrin, Ibuprofen, Aleve, Naproxen or Naprosyn.  Take one aspirin a day for 2 weeks after surgery, unless you have an aspirin sensitivity/allergy or asthma.  Leave your dressing on until your first follow up visit.  You may shower with the dressing.  Hold your arm as if you still have your sling on while you shower. Simply allow the water to wash over the site and then pat dry. Make sure your axilla (armpit) is completely dry after showering.    Please call [phone] during normal business hours or [phone] after hours for any problems. Including the following:  - excessive redness of the incisions - drainage for more than 4 days - fever of more than 101.5 F  *Please note that pain medications will not be refilled after hours or on weekends.  Dental Antibiotics:  In most cases prophylactic antibiotics for Dental procdeures after total joint surgery are not necessary.  Exceptions are as follows:  1. History of prior total joint  infection  2. Severely immunocompromised (Organ Transplant, cancer chemotherapy, Rheumatoid biologic meds such as Westley)  3. Poorly controlled diabetes (A1C &gt; 8.0, blood glucose over 200)  If you have one of these conditions, contact your surgeon for an antibiotic prescription, prior to your dental procedure.    AMBULATORY SURGERY  DISCHARGE INSTRUCTIONS   The drugs that you were given will stay in your system until tomorrow so for the next 24 hours you should not:  Drive an automobile Make any legal decisions Drink any alcoholic beverage   You may resume regular meals tomorrow.  Today it is better to start with liquids and gradually work up to solid foods.  You may eat anything you prefer, but it is better to start with liquids, then soup and crackers, and gradually work up to solid foods.   Please notify your doctor immediately if you have any unusual bleeding, trouble breathing, redness and pain at the surgery site, drainage, fever, or pain not relieved by medication.

## 2020-07-26 NOTE — Anesthesia Postprocedure Evaluation (Signed)
Anesthesia Post Note  Patient: Paul Roth  Procedure(s) Performed: TOTAL SHOULDER ARTHROPLASTY (Right: Shoulder)     Patient location during evaluation: PACU Anesthesia Type: Regional and General Level of consciousness: awake and alert Pain management: pain level controlled Vital Signs Assessment: post-procedure vital signs reviewed and stable Respiratory status: spontaneous breathing, nonlabored ventilation, respiratory function stable and patient connected to nasal cannula oxygen Cardiovascular status: blood pressure returned to baseline and stable Postop Assessment: no apparent nausea or vomiting Anesthetic complications: no   No notable events documented.  Last Vitals:  Vitals:   07/26/20 1300 07/26/20 1315  BP: 115/79 124/85  Pulse: 66 67  Resp: (!) 25 (!) 23  Temp:  36.4 C  SpO2: 96% 94%    Last Pain:  Vitals:   07/26/20 1315  TempSrc:   PainSc: 0-No pain                 Khrystyne Arpin L Matthieu Loftus

## 2020-07-26 NOTE — Transfer of Care (Signed)
Immediate Anesthesia Transfer of Care Note  Patient: Paul Roth  Procedure(s) Performed: TOTAL SHOULDER ARTHROPLASTY (Right: Shoulder)  Patient Location: PACU  Anesthesia Type:GA combined with regional for post-op pain  Level of Consciousness: awake, alert  and oriented  Airway & Oxygen Therapy: Patient Spontanous Breathing and Patient connected to face mask oxygen  Post-op Assessment: Report given to RN and Post -op Vital signs reviewed and stable  Post vital signs: Reviewed and stable  Last Vitals:  Vitals Value Taken Time  BP 121/78 07/26/20 1223  Temp    Pulse 65 07/26/20 1223  Resp 21 07/26/20 1223  SpO2 100 % 07/26/20 1223  Vitals shown include unvalidated device data.  Last Pain:  Vitals:   07/26/20 0834  TempSrc:   PainSc: 3          Complications: No notable events documented.

## 2020-07-26 NOTE — Op Note (Signed)
Procedure(s): TOTAL SHOULDER ARTHROPLASTY Procedure Note  Paul Roth male 70 y.o. 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 male  With endstage right shoulder arthritis. Pain and dysfunction interfered with quality of life and nonoperative treatment with activity modification, NSAIDS and injections failed.     Surgeon: Isabella Stalling   Assistants: Sheryle Hail PA-C ([address] was present and scrubbed throughout the procedure and was essential in positioning, retraction, exposure, and closure)  Anesthesia: General endotracheal anesthesia with preoperative interscalene block given by the attending anesthesiologist     Procedure Detail  TOTAL SHOULDER ARTHROPLASTY  Findings: Tornier flex anatomic press-fit size 3 stem with a 54 high offset head, cemented size 40 medium Cortiloc glenoid.   A lesser tuberosity osteotomy was performed and repaired at the conclusion of the procedure.  Estimated Blood Loss:  200 mL         Drains: None   Blood Given: none          Specimens: none        Complications:  * No complications entered in OR log *         Disposition: PACU - hemodynamically stable.         Condition: stable    Procedure:   The patient was identified in the preoperative holding area where I personally marked the operative extremity after verifying with the patient and consent. He  was taken to the operating room where He was transferred to the   operative table.  The patient received an interscalene block in   the holding area by the attending anesthesiologist.  General anesthesia was induced   in the operating room without complication.  The patient did receive IV  Ancef prior to the commencement of the procedure.  The  patient was   placed in the beach-chair position with the back raised about 30   degrees.  The nonoperative extremity and head and neck were carefully   positioned and padded protecting against neurovascular compromise.  The   left upper extremity was then prepped and draped in the standard sterile   fashion.    The appropriate operative time-out was performed with   Anesthesia, the perioperative staff, as well as myself and we all agreed   that the right side was the correct operative site.  An approximately   10 cm incision was made from the tip of the coracoid to the center point of the   humerus at the level of the axilla.  Dissection was carried down sharply   through subcutaneous tissues and cephalic vein was identified and taken   laterally with the deltoid.  The pectoralis major was taken medially.  The   upper 1 cm of the pectoralis major was released from its attachment on   the humerus.  The clavipectoral fascia was incised just lateral to the   conjoined tendon.  This incision was carried up to but not into the   coracoacromial ligament.  Digital palpation was used to prove   integrity of the axillary nerve which was protected throughout the   procedure.  Musculocutaneous nerve was not palpated in the operative   field.  Conjoined tendon was then retracted gently medially and the   deltoid laterally.  Anterior circumflex humeral vessels  were clamped and   coagulated.  The soft tissues overlying the biceps was incised and this   incision was carried across the transverse humeral ligament to the base   of the coracoid.  The biceps was noted to be severely degenerated. It was released from the superior labrum. The biceps was then tenodesed to the soft tissue just above   pectoralis major and the remaining portion of the biceps superiorly was   excised.  An osteotomy was performed at the lesser tuberosity.  The capsule was then   released all the way down to the 6 o'clock position  of the humeral head.   The humeral head was then delivered with simultaneous adduction,   extension and external rotation.  All humeral osteophytes were removed   and the anatomic neck of the humerus was marked and cut free hand at   approximately 25 degrees retroversion within about 3 mm of the cuff   reflection posteriorly.  The head size was estimated to be a 54 medium   offset.  At that point, the humeral head was retracted posteriorly with   a Fukuda retractor.   Remaining portion of the capsule was released at the base of the   coracoid.  The remaining biceps anchor and the entire anterior-inferior   labrum was excised.  The posterior labrum was also excised but the   posterior capsule was not released.  The guidepin was placed bicortically with 10 degree elevated guide.  The reamer was used to ream to concentric bone with punctate bleeding.  There was approximately 20% posterior glenoid on reamed.  The center hole was then drilled for an anchor peg glenoid followed by the three peripheral holes and none of the holes   exited the glenoid wall.  I then pulse irrigated these holes and dried   them with Surgicel.  The three peripheral holes were then   pressurized cemented and the anchor peg glenoid was placed and impacted   with an excellent fit.  The glenoid was a 40 medium component.  The proximal humerus was then again exposed taking care not to displace the glenoid.    The entry awl was used followed by sounding reamers and then sequentially broached from size 1-3.  This was then left in place and the calcar planer was used. Trial head was placed with a 52 high offset.  There was some superior humeral cut uncovered and the 54 was felt to be the appropriate size.  With the trial implantation of the component,  there was approximately 50% posterior translation with immediate snap back to the   anatomic position.  With forward elevation, there was no tendency   towards posterior subluxation.    The trial was removed and the final implant was prepared on a back table.  The trial was removed and the final implant was prepared on a back table.   3 small holes were drilled on the medial side of the lesser tuberosity osteotomy, through which 2 labral tapes were passed. The implant was then placed through the loop of the 2 labral tapes and impacted with an excellent press-fit. This achieved excellent anatomic reconstruction of the proximal humerus.  The joint was then copiously irrigated with pulse lavage.  The subscapularis and   lesser tuberosity osteotomy were then repaired using the 2 labral tapes previously passed in a double row fashion with horizontal mattress sutures medially brought over through bone tunnels tied over a bone bridge laterally.   One #  1 Ethibond was placed at the rotator interval just above   the lesser tuberosity. Copious irrigation was used. Skin was closed with 2-0 Vicryl sutures in the deep dermal layer and 4-0 Monocryl in a subcuticular  running fashion.  Sterile dressings were then applied including Aquacel.  The patient was placed in a sling and allowed to awaken from general anesthesia and taken to the recovery room in stable condition.      POSTOPERATIVE PLAN:  Early passive range of motion will be allowed with the goal of 0 degrees external rotation and 90 degrees forward elevation.  No internal rotation at this time.  No active motion of the arm until the lesser tuberosity heals.  The patient will likely be discharged home today as long as his pain is well controlled and he is hemodynamically stable.

## 2020-07-26 NOTE — Anesthesia Procedure Notes (Signed)
Procedure Name: Intubation Date/Time: 07/26/2020 10:29 AM Performed by: Genelle Bal, CRNA Pre-anesthesia Checklist: Patient identified, Emergency Drugs available, Suction available and Patient being monitored Patient Re-evaluated:Patient Re-evaluated prior to induction Oxygen Delivery Method: Circle system utilized Preoxygenation: Pre-oxygenation with 100% oxygen Induction Type: IV induction Ventilation: Mask ventilation without difficulty Laryngoscope Size: Miller and 2 Grade View: Grade I Tube type: Oral Tube size: 7.5 mm Number of attempts: 1 Airway Equipment and Method: Stylet and Oral airway Placement Confirmation: ETT inserted through vocal cords under direct vision, positive ETCO2 and breath sounds checked- equal and bilateral Secured at: 23 cm Tube secured with: Tape Dental Injury: Teeth and Oropharynx as per pre-operative assessment

## 2020-07-26 NOTE — Anesthesia Procedure Notes (Signed)
Anesthesia Regional Block: Interscalene brachial plexus block   Pre-Anesthetic Checklist: , timeout performed,  Correct Patient, Correct Site, Correct Laterality,  Correct Procedure, Correct Position, site marked,  Risks and benefits discussed,  Surgical consent,  Pre-op evaluation,  At surgeon's request and post-op pain management  Laterality: Right  Prep: Maximum Sterile Barrier Precautions used, chloraprep       Needles:  Injection technique: Single-shot  Needle Type: Echogenic Stimulator Needle     Needle Length: 4cm  Needle Gauge: 22     Additional Needles:   Procedures:,,,, ultrasound used (permanent image in chart),,    Narrative:  Start time: 07/26/2020 9:20 AM End time: 07/26/2020 9:26 AM Injection made incrementally with aspirations every 5 mL.  Performed by: Personally  Anesthesiologist: Freddrick March, MD  Additional Notes: Monitors applied. No increased pain on injection. No increased resistance to injection. Injection made in 5cc increments. Good needle visualization. Patient tolerated procedure well.

## 2020-07-30 ENCOUNTER — Encounter (HOSPITAL_COMMUNITY): Payer: Self-pay | Admitting: Orthopedic Surgery

## 2020-08-04 ENCOUNTER — Emergency Department (HOSPITAL_COMMUNITY): Payer: Medicare Other

## 2020-08-04 ENCOUNTER — Other Ambulatory Visit: Payer: Self-pay

## 2020-08-04 ENCOUNTER — Inpatient Hospital Stay (HOSPITAL_COMMUNITY)
Admission: EM | Admit: 2020-08-04 | Discharge: 2020-08-08 | DRG: 175 | Disposition: A | Discharge status: Home / Self Care | Payer: Medicare Other | Attending: Family Medicine | Admitting: Family Medicine

## 2020-08-04 ENCOUNTER — Encounter (HOSPITAL_COMMUNITY): Payer: Self-pay | Admitting: Radiology

## 2020-08-04 DIAGNOSIS — Z96611 Presence of right artificial shoulder joint: Secondary | ICD-10-CM | POA: Diagnosis present

## 2020-08-04 DIAGNOSIS — Z20822 Contact with and (suspected) exposure to covid-19: Secondary | ICD-10-CM | POA: Diagnosis present

## 2020-08-04 DIAGNOSIS — Z79899 Other long term (current) drug therapy: Secondary | ICD-10-CM | POA: Diagnosis not present

## 2020-08-04 DIAGNOSIS — D491 Neoplasm of unspecified behavior of respiratory system: Secondary | ICD-10-CM | POA: Diagnosis present

## 2020-08-04 DIAGNOSIS — R0600 Dyspnea, unspecified: Secondary | ICD-10-CM | POA: Diagnosis not present

## 2020-08-04 DIAGNOSIS — J9691 Respiratory failure, unspecified with hypoxia: Secondary | ICD-10-CM | POA: Diagnosis present

## 2020-08-04 DIAGNOSIS — R918 Other nonspecific abnormal finding of lung field: Secondary | ICD-10-CM

## 2020-08-04 DIAGNOSIS — I2699 Other pulmonary embolism without acute cor pulmonale: Principal | ICD-10-CM | POA: Diagnosis present

## 2020-08-04 DIAGNOSIS — I2602 Saddle embolus of pulmonary artery with acute cor pulmonale: Secondary | ICD-10-CM | POA: Diagnosis not present

## 2020-08-04 DIAGNOSIS — J189 Pneumonia, unspecified organism: Secondary | ICD-10-CM | POA: Diagnosis present

## 2020-08-04 DIAGNOSIS — K219 Gastro-esophageal reflux disease without esophagitis: Secondary | ICD-10-CM | POA: Diagnosis present

## 2020-08-04 DIAGNOSIS — Z87891 Personal history of nicotine dependence: Secondary | ICD-10-CM

## 2020-08-04 DIAGNOSIS — J849 Interstitial pulmonary disease, unspecified: Secondary | ICD-10-CM

## 2020-08-04 DIAGNOSIS — I248 Other forms of acute ischemic heart disease: Secondary | ICD-10-CM | POA: Diagnosis present

## 2020-08-04 DIAGNOSIS — E785 Hyperlipidemia, unspecified: Secondary | ICD-10-CM | POA: Diagnosis present

## 2020-08-04 DIAGNOSIS — D72829 Elevated white blood cell count, unspecified: Secondary | ICD-10-CM

## 2020-08-04 DIAGNOSIS — I214 Non-ST elevation (NSTEMI) myocardial infarction: Secondary | ICD-10-CM

## 2020-08-04 LAB — CBC WITH DIFFERENTIAL/PLATELET
Abs Immature Granulocytes: 0.17 10*3/uL — ABNORMAL HIGH (ref 0.00–0.07)
Basophils Absolute: 0.1 10*3/uL (ref 0.0–0.1)
Basophils Relative: 1 %
Eosinophils Absolute: 0.2 10*3/uL (ref 0.0–0.5)
Eosinophils Relative: 1 %
HCT: 46.1 % (ref 39.0–52.0)
Hemoglobin: 15.5 g/dL (ref 13.0–17.0)
Immature Granulocytes: 1 %
Lymphocytes Relative: 13 %
Lymphs Abs: 1.7 10*3/uL (ref 0.7–4.0)
MCH: 31.6 pg (ref 26.0–34.0)
MCHC: 33.6 g/dL (ref 30.0–36.0)
MCV: 93.9 fL (ref 80.0–100.0)
Monocytes Absolute: 1.5 10*3/uL — ABNORMAL HIGH (ref 0.1–1.0)
Monocytes Relative: 12 %
Neutro Abs: 9.2 10*3/uL — ABNORMAL HIGH (ref 1.7–7.7)
Neutrophils Relative %: 72 %
Platelets: 301 10*3/uL (ref 150–400)
RBC: 4.91 MIL/uL (ref 4.22–5.81)
RDW: 13.1 % (ref 11.5–15.5)
WBC: 12.8 10*3/uL — ABNORMAL HIGH (ref 4.0–10.5)
nRBC: 0 % (ref 0.0–0.2)

## 2020-08-04 LAB — BASIC METABOLIC PANEL
Anion gap: 12 (ref 5–15)
BUN: 15 mg/dL (ref 8–23)
CO2: 26 mmol/L (ref 22–32)
Calcium: 9.5 mg/dL (ref 8.9–10.3)
Chloride: 97 mmol/L — ABNORMAL LOW (ref 98–111)
Creatinine, Ser: 1.04 mg/dL (ref 0.61–1.24)
GFR, Estimated: >60 mL/min (ref >60)
Glucose, Bld: 107 mg/dL — ABNORMAL HIGH (ref 70–99)
Potassium: 4.1 mmol/L (ref 3.5–5.1)
Sodium: 135 mmol/L (ref 135–145)

## 2020-08-04 LAB — TROPONIN I (HIGH SENSITIVITY)
Troponin I (High Sensitivity): 95 ng/L — ABNORMAL HIGH (ref <18)
Troponin I (High Sensitivity): 225 ng/L (ref <18)

## 2020-08-04 MED ORDER — SODIUM CHLORIDE (PF) 0.9 % IJ SOLN
INTRAMUSCULAR | Status: AC
  Filled 2020-08-04: qty 50

## 2020-08-04 MED ORDER — ATORVASTATIN CALCIUM 10 MG PO TABS
10.0000 mg | ORAL_TABLET | Freq: Every day | ORAL | Status: DC
  Administered 2020-08-05 – 2020-08-08 (×5): 10 mg via ORAL
  Filled 2020-08-04 (×5): qty 1

## 2020-08-04 MED ORDER — IOHEXOL 350 MG/ML SOLN
100.0000 mL | Freq: Once | INTRAVENOUS | Status: AC | PRN
  Administered 2020-08-04: 100 mL via INTRAVENOUS

## 2020-08-04 MED ORDER — ONDANSETRON HCL 4 MG PO TABS: 4.0000 mg | ORAL_TABLET | Freq: Four times a day (QID) | ORAL | Status: DC | PRN

## 2020-08-04 MED ORDER — NORTRIPTYLINE HCL 25 MG PO CAPS
75.0000 mg | ORAL_CAPSULE | Freq: Every day | ORAL | Status: DC
  Administered 2020-08-05 – 2020-08-07 (×4): 75 mg via ORAL
  Filled 2020-08-04 (×4): qty 3

## 2020-08-04 MED ORDER — IPRATROPIUM-ALBUTEROL 0.5-2.5 (3) MG/3ML IN SOLN: 3.0000 mL | RESPIRATORY_TRACT | Status: DC | PRN

## 2020-08-04 MED ORDER — SODIUM CHLORIDE 0.9 % IV SOLN
1.0000 g | INTRAVENOUS | Status: DC
  Administered 2020-08-05 – 2020-08-08 (×4): 1 g via INTRAVENOUS
  Filled 2020-08-04: qty 10
  Filled 2020-08-04 (×3): qty 1

## 2020-08-04 MED ORDER — ACETAMINOPHEN 650 MG RE SUPP: 650.0000 mg | Freq: Four times a day (QID) | RECTAL | Status: DC | PRN

## 2020-08-04 MED ORDER — HEPARIN BOLUS VIA INFUSION
5000.0000 [IU] | Freq: Once | INTRAVENOUS | Status: AC
  Administered 2020-08-05: 5000 [IU] via INTRAVENOUS
  Filled 2020-08-04 (×2): qty 5000

## 2020-08-04 MED ORDER — POLYETHYLENE GLYCOL 3350 17 G PO PACK: 17.0000 g | PACK | Freq: Every day | ORAL | Status: DC | PRN

## 2020-08-04 MED ORDER — ASPIRIN 81 MG PO CHEW
324.0000 mg | CHEWABLE_TABLET | Freq: Once | ORAL | Status: AC
  Administered 2020-08-04: 324 mg via ORAL
  Filled 2020-08-04: qty 4

## 2020-08-04 MED ORDER — ASPIRIN EC 81 MG PO TBEC
81.0000 mg | DELAYED_RELEASE_TABLET | Freq: Every day | ORAL | Status: DC
  Administered 2020-08-05 – 2020-08-07 (×4): 81 mg via ORAL
  Filled 2020-08-04 (×4): qty 1

## 2020-08-04 MED ORDER — SODIUM CHLORIDE 0.9 % IV SOLN
500.0000 mg | INTRAVENOUS | Status: DC
  Administered 2020-08-05 – 2020-08-07 (×3): 500 mg via INTRAVENOUS
  Filled 2020-08-04 (×4): qty 500

## 2020-08-04 MED ORDER — SODIUM CHLORIDE 0.9 % IV BOLUS
500.0000 mL | Freq: Once | INTRAVENOUS | Status: AC
  Administered 2020-08-04: 500 mL via INTRAVENOUS

## 2020-08-04 MED ORDER — ACETAMINOPHEN 325 MG PO TABS
650.0000 mg | ORAL_TABLET | Freq: Four times a day (QID) | ORAL | Status: DC | PRN
  Administered 2020-08-05 – 2020-08-08 (×4): 650 mg via ORAL
  Filled 2020-08-04 (×4): qty 2

## 2020-08-04 MED ORDER — LACTATED RINGERS IV SOLN: INTRAVENOUS | Status: DC

## 2020-08-04 MED ORDER — HYDROCODONE-ACETAMINOPHEN 5-325 MG PO TABS
1.0000 | ORAL_TABLET | Freq: Four times a day (QID) | ORAL | Status: AC | PRN
  Administered 2020-08-05 – 2020-08-07 (×8): 1 via ORAL
  Filled 2020-08-04 (×8): qty 1

## 2020-08-04 MED ORDER — SODIUM CHLORIDE 0.9 % IV SOLN
1.0000 g | Freq: Once | INTRAVENOUS | Status: AC
  Administered 2020-08-04: 1 g via INTRAVENOUS
  Filled 2020-08-04: qty 10

## 2020-08-04 MED ORDER — FENTANYL CITRATE (PF) 100 MCG/2ML IJ SOLN
50.0000 ug | Freq: Once | INTRAMUSCULAR | Status: AC
Start: 2020-08-04 — End: 2020-08-04
  Administered 2020-08-04: 50 ug via INTRAVENOUS
  Filled 2020-08-04: qty 2

## 2020-08-04 MED ORDER — ONDANSETRON HCL 4 MG/2ML IJ SOLN: 4.0000 mg | Freq: Four times a day (QID) | INTRAMUSCULAR | Status: DC | PRN

## 2020-08-04 MED ORDER — HEPARIN (PORCINE) 25000 UT/250ML-% IV SOLN
1400.0000 [IU]/h | INTRAVENOUS | Status: DC
  Administered 2020-08-05 (×2): 1400 [IU]/h via INTRAVENOUS
  Filled 2020-08-04: qty 250

## 2020-08-04 MED ORDER — SODIUM CHLORIDE 0.9 % IV SOLN
500.0000 mg | Freq: Once | INTRAVENOUS | Status: AC
  Administered 2020-08-04: 500 mg via INTRAVENOUS
  Filled 2020-08-04: qty 500

## 2020-08-04 NOTE — ED Provider Notes (Signed)
[facility] DEPT Provider Note   CSN: [id number] Arrival date & time: 08/04/20  1544     History Chief Complaint  Patient presents with   Shortness of Breath    Paul Roth is a 70 y.o. male.  HPI 70 year old male presents with left chest pain.  About 10 days ago he had right shoulder surgery.  He has been taking some hydrocodone and weaning himself off.  2 days ago he was doing some work he probably should have been doing and started to notice some mild pain in his left chest.  However yesterday evening the pain all of a sudden became worse.  It was severe, especially with inspiration.  It has continued to be that way and this morning he woke up short of breath.  He has a chronic cough that he states is from nasal drainage but not really new or worse.  No fevers.  No leg swelling.  He has had a prior history of a collapsed lung and this feels similar.  He has been taking hydrocodone which has helped and currently the pain is about a 2 or 3 out of 10.  Past Medical History:  Diagnosis Date   Arthritis    Dyspnea    Hyperlipidemia    Tinnitus     There are no problems to display for this patient.   Past Surgical History:  Procedure Laterality Date   EYE SURGERY     TOTAL SHOULDER ARTHROPLASTY Right 07/26/2020   Procedure: TOTAL SHOULDER ARTHROPLASTY;  Surgeon: Tania Ade, MD;  Location: [facility];  Service: Orthopedics;  Laterality: Right;       Family History  Problem Relation Age of Onset   Lung disease Mother    Lung disease Maternal Aunt     Social History   Tobacco Use   Smoking status: Former    Packs/day: 1.00    Years: 35.00    Pack years: 35.00    Types: Cigarettes    Quit date: 1990    Years since quitting: 32.5   Smokeless tobacco: Never  Vaping Use   Vaping Use: Never used  Substance Use Topics   Alcohol use: Yes    Comment: rare   Drug use: Not Currently    Types: Marijuana    Comment: hx of years ago     Home Medications Prior to Admission medications   Medication Sig Start Date End Date Taking? Authorizing Provider  ascorbic acid (VITAMIN C) 100 MG tablet Take 100 mg by mouth daily.    [provider]  atorvastatin (LIPITOR) 10 MG tablet Take 10 mg by mouth daily. 05/15/20   [provider]  MAGNESIUM CITRATE PO Take 1 Scoop by mouth daily.    [provider]  nortriptyline (PAMELOR) 75 MG capsule Take 75 mg by mouth at bedtime. 06/23/20   [provider]  prednisoLONE acetate (PRED FORTE) 1 % ophthalmic suspension Place 1 drop into the right eye in the morning and at bedtime. 06/15/20   [provider]  tiZANidine (ZANAFLEX) 2 MG tablet Take 1 tablet (2 mg total) by mouth every 8 (eight) hours as needed (as needed for muscle pain). 07/26/20   Porterfield, Amber, PA-C  VITAMIN D, CHOLECALCIFEROL, PO Take 1 tablet by mouth daily.    [provider]    Allergies    Patient has no known allergies.  Review of Systems   Review of Systems  Constitutional:  Negative for fever.  Respiratory:  Positive  for cough and shortness of breath.   Cardiovascular:  Positive for chest pain. Negative for leg swelling.  Musculoskeletal:  Positive for back pain.  All other systems reviewed and are negative.  Physical Exam Updated Vital Signs BP 123/81   Pulse 80   Temp 97.7 F (36.5 C) (Oral)   Resp 20   SpO2 98%   Physical Exam Vitals and nursing note reviewed.  Constitutional:      General: He is not in acute distress.    Appearance: He is well-developed. He is not ill-appearing or diaphoretic.  HENT:     Head: Normocephalic and atraumatic.     Right Ear: External ear normal.     Left Ear: External ear normal.     Nose: Nose normal.  Eyes:     General:        Right eye: No discharge.        Left eye: No discharge.  Cardiovascular:     Rate and Rhythm: Normal rate and regular rhythm.     Heart sounds: Normal heart sounds.  Pulmonary:      Effort: Pulmonary effort is normal.     Breath sounds: Examination of the left-middle field reveals rales. Examination of the left-lower field reveals rales. Rales present.  Chest:     Chest wall: No tenderness or crepitus.  Abdominal:     Palpations: Abdomen is soft.     Tenderness: There is no abdominal tenderness.  Musculoskeletal:     Cervical back: Neck supple.     Right lower leg: No edema.     Left lower leg: No edema.  Skin:    General: Skin is warm and dry.  Neurological:     Mental Status: He is alert.  Psychiatric:        Mood and Affect: Mood is not anxious.    ED Results / Procedures / Treatments   Labs (all labs ordered are listed, but only abnormal results are displayed) Labs Reviewed  BASIC METABOLIC PANEL - Abnormal; Notable for the following components:      Result Value   Chloride 97 (*)    Glucose, Bld 107 (*)    All other components within normal limits  CBC WITH DIFFERENTIAL/PLATELET - Abnormal; Notable for the following components:   WBC 12.8 (*)    Neutro Abs 9.2 (*)    Monocytes Absolute 1.5 (*)    Abs Immature Granulocytes 0.17 (*)    All other components within normal limits  TROPONIN I (HIGH SENSITIVITY) - Abnormal; Notable for the following components:   Troponin I (High Sensitivity) 95 (*)    All other components within normal limits  CULTURE, BLOOD (ROUTINE X 2)  CULTURE, BLOOD (ROUTINE X 2)  SARS CORONAVIRUS 2 (TAT 6-24 HRS)  TROPONIN I (HIGH SENSITIVITY)    EKG EKG Interpretation  Date/Time:  Saturday August 04 2020 17:29:19 EDT Ventricular Rate:  75 PR Interval:  198 QRS Duration: 94 QT Interval:  390 QTC Calculation: 435 R Axis:   -75 Text Interpretation: Normal sinus rhythm Left anterior fascicular block  no significant change since June 2022 Confirmed by Sherwood Gambler [phone]) on 08/04/2020 5:34:27 PM  Radiology CT Angio Chest PE W and/or Wo Contrast  Result Date: 08/04/2020 CLINICAL DATA:  PE suspected, high prob  Intermittent left-sided chest pain with increasing shortness of breath. Shoulder surgery 10 days ago. EXAM: CT ANGIOGRAPHY CHEST WITH CONTRAST TECHNIQUE: Multidetector CT imaging of the chest was performed using the standard protocol during bolus  administration of intravenous contrast. Multiplanar CT image reconstructions and MIPs were obtained to evaluate the vascular anatomy. CONTRAST:  126mL OMNIPAQUE IOHEXOL 350 MG/ML SOLN COMPARISON:  Radiograph earlier today. Radiograph 07/20/2020 also reviewed. FINDINGS: Cardiovascular: There is lobulated soft tissue density at the left hilum, which causes mass effect on the distal left pulmonary artery. Adjacent to this lobulated soft tissue density are small intraluminal filling defects within the left upper lobe and lingular segmental pulmonary arterial branches. The left upper lobe pulmonary arteries are attenuated. There are no filling defects within the right pulmonary arteries. Dilated central pulmonary artery at 3.5 cm. Tortuous atherosclerotic thoracic aorta without dissection. Heart size normal. No pericardial effusion. Mediastinum/Nodes: Multiple enlarged left hilar lymph nodes including a 15 mm node series 4, image 67. Enlarged AP window node measures 18 mm, series 4, image 51. There prominent prevascular nodes measuring up to 9 mm. Left lower paratracheal node measures 17 mm, series 4, image 54. There is enlarged subcarinal node at 18 mm. Borderline right hilar node at 11 mm, series 4, image 67. Tiny hiatal hernia. Lungs/Pleura: Findings of interstitial lung disease with basilar and peripheral predominant honeycombing. There is a an area of masslike opacity in the perifissural left upper lobe peripherally. Exact dimensions are difficult due to the ill-defined nature, however this measures at least 4.4 x 1.9 x 4 cm, measured on series 6, image 42 and series 7, image 64. Surrounding ground-glass and ill-defined opacity, as well as coursing along the left inter  lobar fissure and projecting into the superior segment of the left lower lobe, series 6, image 37. There is a small left pleural effusion. No definite pleural thickening or enhancement. Minimal retained mucus in the trachea and central bronchi. Upper Abdomen: Nonspecific hypodensities in the liver, largest in the left lobe measuring 17 mm, series 4, image 134. Water density lesion arising from the posterior right kidney measures 2.9 cm. There additional low-density lesions in the right kidney that are incompletely characterized. Mild left adrenal thickening. No dominant adrenal nodule. Musculoskeletal: Right humeral head arthroplasty. Endplate irregularity with sclerosis and likely adjacent Schmorl's nodes at T12-L1. No discrete bone lesion. Review of the MIP images confirms the above findings. IMPRESSION: 1. Small intraluminal filling defects in the left central pulmonary arteries extending into the segmental branches, consistent with small pulmonary emboli. Some of these filling defects are contiguous with left hilar adenopathy and left hilar soft tissue density. 2. Masslike opacity in the periphery of the left upper lobe measures approximately 4.4 cm. Differential considerations include neoplasm or infection. 3. Mediastinal and left hilar adenopathy. 4. Small left pleural effusion. 5. Findings of interstitial lung disease with basilar and peripheral predominant fibrosis/honeycombing. 6. Nonspecific hypodensities in the liver, largest in the left lobe measuring 17 mm. 7. Right renal cyst as well as low-density lesions in the right kidney that are incompletely characterized. Aortic Atherosclerosis (ICD10-I70.0). Critical Value/emergent results were called by telephone at the time of interpretation on 08/04/2020 at 7:06 pm to provider Sherwood Gambler , who verbally acknowledged these results. Electronically Signed   By: Keith Rake M.D.   On: 08/04/2020 19:07   DG Chest Portable 1 View  Result Date:  08/04/2020 CLINICAL DATA:  Progressive shortness of breath the last 2 days. EXAM: PORTABLE CHEST 1 VIEW COMPARISON:  07/21/2015. FINDINGS: Heart size and mediastinal contours are unremarkable. No pleural effusion identified. Diffuse chronic appearing reticular interstitial opacities are noted throughout both lungs which is concerning for fibrotic lung disease. Superimposed airspace opacity within the  left upper lobe is concerning for pneumonia. This is increased when compared with previous study. Status post right shoulder arthroplasty. Chronic degenerative changes are noted involving the left shoulder. IMPRESSION: 1. Left upper lobe airspace opacity, increased when compared with previous study. This is worrisome for pneumonia. Followup PA and lateral chest X-ray is recommended in 3-4 weeks following trial of antibiotic therapy to ensure resolution and exclude underlying malignancy. 2. Chronic interstitial lung disease. Recommend follow-up imaging with nonemergent high-resolution CT of the chest following resolution of this acute episode for more definitive diagnosis. Electronically Signed   By: Kerby Moors M.D.   On: 08/04/2020 17:40    Procedures .Critical Care  Date/Time: 08/04/2020 7:41 PM Performed by: Sherwood Gambler, MD Authorized by: Sherwood Gambler, MD   Critical care provider statement:    Critical care time (minutes):  40   Critical care time was exclusive of:  Separately billable procedures and treating other patients   Critical care was necessary to treat or prevent imminent or life-threatening deterioration of the following conditions:  Circulatory failure and respiratory failure   Critical care was time spent personally by me on the following activities:  Discussions with consultants, evaluation of patient's response to treatment, examination of patient, ordering and performing treatments and interventions, ordering and review of laboratory studies, ordering and review of radiographic  studies, pulse oximetry, re-evaluation of patient's condition, obtaining history from patient or surrogate and review of old charts   Medications Ordered in ED Medications  cefTRIAXone (ROCEPHIN) 1 g in sodium chloride 0.9 % 100 mL IVPB (has no administration in time range)  azithromycin (ZITHROMAX) 500 mg in sodium chloride 0.9 % 250 mL IVPB (has no administration in time range)  sodium chloride 0.9 % bolus 500 mL (0 mLs Intravenous Stopped 08/04/20 1802)  fentaNYL (SUBLIMAZE) injection 50 mcg (50 mcg Intravenous Given 08/04/20 1652)  aspirin chewable tablet 324 mg (324 mg Oral Given 08/04/20 1800)  sodium chloride (PF) 0.9 % injection (  Given by Other 08/04/20 1939)  iohexol (OMNIPAQUE) 350 MG/ML injection 100 mL (100 mLs Intravenous Contrast Given 08/04/20 1819)    ED Course  I have reviewed the triage vital signs and the nursing notes.  Pertinent labs & imaging results that were available during my care of the patient were reviewed by me and considered in my medical decision making (see chart for details).    MDM Rules/Calculators/A&P                          Patient is in no acute distress.  He is not hypoxic.  Given his recent surgery I have high concern for PE and so after the chest x-ray, a CTA was ordered.  Oddly it shows both pulmonary embolism and also mass versus pneumonia.  He has a chronic cough that might be a little bit worse.  We will treat with pneumonia antibiotics and get blood cultures.  I suspect the moderate troponin elevation is from the PE itself though no obvious right heart strain.  No saddle PE.  Will start on heparin.  He will probably need a pulmonology consult to evaluate for bronchoscopy.  I have made him aware of the mass versus pneumonia findings.  Discussed with hospitalist for admission. Final Clinical Impression(s) / ED Diagnoses Final diagnoses:  Acute pulmonary embolism, unspecified pulmonary embolism type, unspecified whether acute cor pulmonale present [facility] [address])   Pulmonary mass    Rx / DC Orders ED Discharge Orders  None        Sherwood Gambler, MD 08/04/20 [phone]

## 2020-08-04 NOTE — ED Notes (Signed)
CRITICAL VALUE STICKER  CRITICAL VALUE: Troponin 225  DATE & TIME NOTIFIED: 08/04/2020 2123  MD NOTIFIED: Chotiner MD  TIME OF NOTIFICATION: 2124

## 2020-08-04 NOTE — ED Notes (Signed)
EKG completed and handed to Dr. Regenia Skeeter at this time by this nurse

## 2020-08-04 NOTE — ED Triage Notes (Signed)
Pt reports increased SHOB x2 days and intermittent left sided chest pain that wraps around to back. Pt reports having shoulder surgery about 10 days ago. Pt also reports a history of collapsed left lung when he was 25 and is concerned that has happened again.

## 2020-08-04 NOTE — H&P (Signed)
History and Physical    Paul Roth QQV:[id number] DOB: 12-May-1950 DOA: 08/04/2020  PCP: Bernerd Limbo, MD   Patient coming from: Home  Chief Complaint: SOB, chest pain, cough  HPI: Paul Roth is a 70 y.o. male with medical history significant for HLD, interstitial lung disease, OA who presents for evaluation of shortness of breath associated with left-sided chest pain and diaphoresis.  Patient had right shoulder surgery 2 weeks ago which she has been at home recovering from.  He has been using hydrocodone for pain which he has been weaning himself off.  2 days ago he did some light yard work of picking blueberries and using his left arm.  The next day he noticed some left-sided chest pain that was not severe and did not alter his activities.  Last night he noticed some shortness of breath that was worse with exertion and ambulation along with the chest pain.  This morning when he woke up the left-sided chest pain and shortness of breath became severe and was associated with diaphoresis.  He does not have any nausea or vomiting.  Taking a deep breath or moving his left arm worsened the pain.  Shortness of breath improved with with sit down and rest.  He does report a chronic cough and postnasal drip which has not changed.  He has not had any fevers chills, swelling of his legs, recent travel.  He reports when he was 70 year old had a collapsed lung and states this pain felt similar to that.  When the pain was severe he did take hydrocodone at home which helped relieve the pain.  Pain is worst was a 9 out of 10 and is currently resolved.  States he has never had blood clots or pulmonary embolisms. He has a history of smoking but quit in 1990.  He has a history of smoking marijuana but quit this over 10 years ago.  He denies alcohol. Lives with his wife. No known recent sick contacts  ED Course: Mr. [name] has been hemodynamically stable since arrival in the emergency room.  He is currently  on room air and has oxygen saturation of 97 to 100%.  His EKG showed no acute ischemia.  Troponin was elevated at 95.  WBC is 12,800, hemoglobin 15.5, hematocrit 46.1, platelets 301,000.  Sodium is 135, potassium 4.1, chloride 97, bicarb 26, creatinine 1.04, BUN 15, glucose 107.  Chest x-ray showed an opacity in the left upper lobe which was larger than the previous chest x-ray.  CT angiography was obtained which showed left-sided small pulmonary emboli.  Patient also has a left upper lobe mass which favors neoplasm but could also have component of pneumonia.  Patient was placed on heparin infusion in the emergency room for therapeutic anticoagulation.  Patient was given antibiotics with Rocephin and azithromycin.  Hospitalist service was asked to admit patient for further management.  I have called and discussed case with pulmonology who will see patient in the morning in consultation  Review of Systems:  General: Denies fever, chills, weight loss, night sweats.  Denies dizziness.  Denies change in appetite HENT: Denies head trauma, headache, denies change in hearing, tinnitus.  Denies nasal congestion.  Denies sore throat.  Denies difficulty swallowing Eyes: Denies blurry vision, pain in eye, drainage.  Denies discoloration of eyes. Neck: Denies pain.  Denies swelling.  Denies pain with movement. Cardiovascular: Reports left sided chest pain. Denies palpitations.  Denies edema.  Denies orthopnea Respiratory: Reports shortness of breath, cough.  Denies wheezing.  Denies sputum production Gastrointestinal: Denies abdominal pain, swelling.  Denies nausea, vomiting, diarrhea.  Denies melena.  Denies hematemesis. Musculoskeletal: Has limited movement of right arm due to recent shoulder surgery. Right shoulder pain. No joint effusion or deformity Genitourinary: Denies pelvic pain.  Denies urinary frequency or hesitancy.  Denies dysuria.  Skin: Denies rash.  Denies petechiae, purpura,  ecchymosis. Neurological: Denies syncope. Denies seizure activity. Denies paresthesia. Denies slurred speech, drooping face.  Denies visual change. Psychiatric: Denies depression, anxiety. Denies hallucinations.  Past Medical History:  Diagnosis Date   Arthritis    Dyspnea    Hyperlipidemia    Tinnitus     Past Surgical History:  Procedure Laterality Date   EYE SURGERY     TOTAL SHOULDER ARTHROPLASTY Right 07/26/2020   Procedure: TOTAL SHOULDER ARTHROPLASTY;  Surgeon: Tania Ade, MD;  Location: [facility];  Service: Orthopedics;  Laterality: Right;    Social History  reports that he quit smoking about 32 years ago. His smoking use included cigarettes. He has a 35.00 pack-year smoking history. He has never used smokeless tobacco. He reports current alcohol use. He reports previous drug use. Drug: Marijuana.  No Known Allergies  Family History  Problem Relation Age of Onset   Lung disease Mother    Lung disease Maternal Aunt      Prior to Admission medications   Medication Sig Start Date End Date Taking? Authorizing Provider  ascorbic acid (VITAMIN C) 100 MG tablet Take 100 mg by mouth daily.    [provider]  atorvastatin (LIPITOR) 10 MG tablet Take 10 mg by mouth daily. 05/15/20   [provider]  HYDROcodone-acetaminophen (NORCO/VICODIN) 5-325 MG tablet Take 1 tablet by mouth every 6 (six) hours as needed. 08/01/20   [provider]  MAGNESIUM CITRATE PO Take 1 Scoop by mouth daily.    [provider]  nortriptyline (PAMELOR) 75 MG capsule Take 75 mg by mouth at bedtime. 06/23/20   [provider]  ondansetron (ZOFRAN) 4 MG tablet Take 4 mg by mouth every 8 (eight) hours as needed. 08/01/20   [provider]  prednisoLONE acetate (PRED FORTE) 1 % ophthalmic suspension Place 1 drop into the right eye in the morning and at bedtime. 06/15/20   [provider]  tiZANidine (ZANAFLEX) 2 MG tablet Take 1 tablet (2 mg  total) by mouth every 8 (eight) hours as needed (as needed for muscle pain). 07/26/20   Porterfield, Amber, PA-C  VITAMIN D, CHOLECALCIFEROL, PO Take 1 tablet by mouth daily.    [provider]    Physical Exam: Vitals:   08/04/20 1759 08/04/20 1830 08/04/20 1915 08/04/20 1945  BP: 124/85 136/84 123/81 (!) 135/92  Pulse: 78 (!) 101 80 83  Resp: 16 19 20 19   Temp:      TempSrc:      SpO2: 97% (!) 86% 98% 100%    Constitutional: NAD, calm, comfortable Vitals:   08/04/20 1759 08/04/20 1830 08/04/20 1915 08/04/20 1945  BP: 124/85 136/84 123/81 (!) 135/92  Pulse: 78 (!) 101 80 83  Resp: 16 19 20 19   Temp:      TempSrc:      SpO2: 97% (!) 86% 98% 100%   General: WDWN, Alert and oriented x3.  Eyes: EOMI, PERRL, conjunctivae normal.  Sclera nonicteric HENT:  [place]/AT, external ears normal.  Nares patent without epistasis.  Mucous membranes are moist. Post nasal drip noted. Neck: Soft, normal range of motion, supple, no masses, no thyromegaly. Trachea midline  Respiratory: Equal breath sounds. Bilateral rales-more pronounced on left side. no wheezing, no crackles. Normal respiratory effort. No accessory muscle use.  Cardiovascular: Regular rate and rhythm, no murmurs / rubs / gallops. No extremity edema. 2+ pedal pulses.   Abdomen: Soft, no tenderness, nondistended, no rebound or guarding.  No masses palpated. No hepatosplenomegaly. Bowel sounds normoactive Musculoskeletal: FROM except right arm which is in sling. Dressing over surgical incision on right anterior shoulder. Has clubbing of digits. no cyanosis. No joint deformity upper and lower extremities. Normal muscle tone.  Skin: Warm, dry, intact no rashes, lesions, ulcers. No induration Neurologic: CN 2-12 grossly intact.  Normal speech.  Sensation intact to touch.   Psychiatric: Normal judgment and insight.  Normal mood.    Labs on Admission: I have personally reviewed following labs and imaging studies  CBC: Recent Labs   Lab 08/04/20 1639  WBC 12.8*  NEUTROABS 9.2*  HGB 15.5  HCT 46.1  MCV 93.9  PLT 546    Basic Metabolic Panel: Recent Labs  Lab 08/04/20 1639  NA 135  K 4.1  CL 97*  CO2 26  GLUCOSE 107*  BUN 15  CREATININE 1.04  CALCIUM 9.5    GFR: Estimated Creatinine Clearance: 75.8 mL/min (by C-G formula based on SCr of 1.04 mg/dL).  Liver Function Tests: No results for input(s): AST, ALT, ALKPHOS, BILITOT, PROT, ALBUMIN in the last 168 hours.  Urine analysis: No results found for: COLORURINE, APPEARANCEUR, LABSPEC, PHURINE, GLUCOSEU, HGBUR, BILIRUBINUR, KETONESUR, PROTEINUR, UROBILINOGEN, NITRITE, LEUKOCYTESUR  Radiological Exams on Admission: CT Angio Chest PE W and/or Wo Contrast  Result Date: 08/04/2020 CLINICAL DATA:  PE suspected, high prob Intermittent left-sided chest pain with increasing shortness of breath. Shoulder surgery 10 days ago. EXAM: CT ANGIOGRAPHY CHEST WITH CONTRAST TECHNIQUE: Multidetector CT imaging of the chest was performed using the standard protocol during bolus administration of intravenous contrast. Multiplanar CT image reconstructions and MIPs were obtained to evaluate the vascular anatomy. CONTRAST:  112mL OMNIPAQUE IOHEXOL 350 MG/ML SOLN COMPARISON:  Radiograph earlier today. Radiograph 07/20/2020 also reviewed. FINDINGS: Cardiovascular: There is lobulated soft tissue density at the left hilum, which causes mass effect on the distal left pulmonary artery. Adjacent to this lobulated soft tissue density are small intraluminal filling defects within the left upper lobe and lingular segmental pulmonary arterial branches. The left upper lobe pulmonary arteries are attenuated. There are no filling defects within the right pulmonary arteries. Dilated central pulmonary artery at 3.5 cm. Tortuous atherosclerotic thoracic aorta without dissection. Heart size normal. No pericardial effusion. Mediastinum/Nodes: Multiple enlarged left hilar lymph nodes including a 15 mm node  series 4, image 67. Enlarged AP window node measures 18 mm, series 4, image 51. There prominent prevascular nodes measuring up to 9 mm. Left lower paratracheal node measures 17 mm, series 4, image 54. There is enlarged subcarinal node at 18 mm. Borderline right hilar node at 11 mm, series 4, image 67. Tiny hiatal hernia. Lungs/Pleura: Findings of interstitial lung disease with basilar and peripheral predominant honeycombing. There is a an area of masslike opacity in the perifissural left upper lobe peripherally. Exact dimensions are difficult due to the ill-defined nature, however this measures at least 4.4 x 1.9 x 4 cm, measured on series 6, image 42 and series 7, image 64. Surrounding ground-glass and ill-defined opacity, as well as coursing along the left inter lobar fissure and projecting into the superior segment of the left lower lobe, series 6, image 37. There is a small left pleural effusion.  No definite pleural thickening or enhancement. Minimal retained mucus in the trachea and central bronchi. Upper Abdomen: Nonspecific hypodensities in the liver, largest in the left lobe measuring 17 mm, series 4, image 134. Water density lesion arising from the posterior right kidney measures 2.9 cm. There additional low-density lesions in the right kidney that are incompletely characterized. Mild left adrenal thickening. No dominant adrenal nodule. Musculoskeletal: Right humeral head arthroplasty. Endplate irregularity with sclerosis and likely adjacent Schmorl's nodes at T12-L1. No discrete bone lesion. Review of the MIP images confirms the above findings. IMPRESSION: 1. Small intraluminal filling defects in the left central pulmonary arteries extending into the segmental branches, consistent with small pulmonary emboli. Some of these filling defects are contiguous with left hilar adenopathy and left hilar soft tissue density. 2. Masslike opacity in the periphery of the left upper lobe measures approximately 4.4 cm.  Differential considerations include neoplasm or infection. 3. Mediastinal and left hilar adenopathy. 4. Small left pleural effusion. 5. Findings of interstitial lung disease with basilar and peripheral predominant fibrosis/honeycombing. 6. Nonspecific hypodensities in the liver, largest in the left lobe measuring 17 mm. 7. Right renal cyst as well as low-density lesions in the right kidney that are incompletely characterized. Aortic Atherosclerosis (ICD10-I70.0). Critical Value/emergent results were called by telephone at the time of interpretation on 08/04/2020 at 7:06 pm to provider Sherwood Gambler , who verbally acknowledged these results. Electronically Signed   By: Keith Rake M.D.   On: 08/04/2020 19:07   DG Chest Portable 1 View  Result Date: 08/04/2020 CLINICAL DATA:  Progressive shortness of breath the last 2 days. EXAM: PORTABLE CHEST 1 VIEW COMPARISON:  07/21/2015. FINDINGS: Heart size and mediastinal contours are unremarkable. No pleural effusion identified. Diffuse chronic appearing reticular interstitial opacities are noted throughout both lungs which is concerning for fibrotic lung disease. Superimposed airspace opacity within the left upper lobe is concerning for pneumonia. This is increased when compared with previous study. Status post right shoulder arthroplasty. Chronic degenerative changes are noted involving the left shoulder. IMPRESSION: 1. Left upper lobe airspace opacity, increased when compared with previous study. This is worrisome for pneumonia. Followup PA and lateral chest X-ray is recommended in 3-4 weeks following trial of antibiotic therapy to ensure resolution and exclude underlying malignancy. 2. Chronic interstitial lung disease. Recommend follow-up imaging with nonemergent high-resolution CT of the chest following resolution of this acute episode for more definitive diagnosis. Electronically Signed   By: Kerby Moors M.D.   On: 08/04/2020 17:40    EKG: Independently  reviewed.  EKG shows normal sinus rhythm with left anterior fascicular block.  No acute ST elevation or depression.  QTc 435  Assessment/Plan Principal Problem:   Pulmonary embolism  Mr. Eisner is admitted to telemetry floor.  He is placed on a heparin infusion for therapeutic anticoagulation.  Heparin infusion is used in case bronchoscopy or biopsy of lung mass will be required as it can be turned off and reversed quickly. 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  Favors mass per radiology report but pneumonia cannot be completely ruled out. Consulted pulmonology and they will see patient in the morning for further evaluation and recommendation of biopsy and/or bronchoscopy    NSTEMI (non-ST elevated myocardial infarction)  Patient been placed on therapeutic heparin. Monitor troponins overnight.  If  troponins continue to elevate will obtain cardiology consult in the morning.  Patient is hemodynamically stable at this time Enteric-coated aspirin daily.    Interstitial lung disease  DuoNeb as needed for shortness of breath    Dyspnea Secondary to PE and lung mass versus pneumonia.  On oxygen as above.  DuoNebs as needed    Leukocytosis Mild elevation of WBC.  Patient is placed on antibiotics with Rocephin and azithromycin to cover for pneumonia in the left upper lobe.  Further recommendations after pulmonology evaluation Recheck CBC in morning    DVT prophylaxis: Placed on heparin infusion for therapeutic anticoagulation  Code Status:   Full Code  Family Communication:  Diagnosis and plan discussed with patient.  Patient verbalized understanding and agrees with plan.  Further recommendations to follow as clinically indicated Disposition Plan:   Patient is from:  Home  Anticipated DC to:  Home  Anticipated DC  date:  Anticipate 2 midnight or more stay in the hospital  Anticipated DC barriers: No barriers to discharge identified this time  Consults called:  Pulmonology, discussed with On call physician and pt will be seen in am for consultation  Admission status:  Inpatient   Yevonne Aline Aleshka Corney MD [facility]  How to contact the [facility] Attending or Consulting provider [place] or covering provider during after hours 7P -7A, for this patient?   Check the care team in [facility] and look for a) attending/consulting [facility] provider listed and b) the [facility] team listed Log into [email or web address] and use [place]'s universal password to access. If you do not have the password, please contact the hospital operator. Locate the [facility] provider you are looking for under [facility] and page to a number that you can be directly reached. If you still have difficulty reaching the provider, please page the [facility] (Director on Call) for the Hospitalists listed on amion for assistance.  08/04/2020, 8:10 PM

## 2020-08-04 NOTE — Progress Notes (Signed)
ANTICOAGULATION CONSULT NOTE - Initial Consult  Pharmacy Consult for IV heparin Indication: pulmonary embolus  No Known Allergies  Patient Measurements:   Heparin Dosing Weight: 82.1 kg   Vital Signs: Temp: 97.7 F (36.5 C) (07/02 1555) Temp Source: Oral (07/02 1555) BP: 135/92 (07/02 1945) Pulse Rate: 83 (07/02 1945)  Labs: Recent Labs    08/04/20 1639  HGB 15.5  HCT 46.1  PLT 301  CREATININE 1.04  TROPONINIHS 95*    Estimated Creatinine Clearance: 75.8 mL/min (by C-G formula based on SCr of 1.04 mg/dL).   Medical History: Past Medical History:  Diagnosis Date   Arthritis    Dyspnea    Hyperlipidemia    Tinnitus     Medications:  Scheduled:   heparin  5,000 Units Intravenous Once    Assessment: Pharmacy is consulted to dose heparin in 70 yo male. Pt with recent orthopedic shoulder surgery on 6/23. No noted anticoagluation on PTA med list.   Today,08/04/20 Hgb 15.5, plt 301 Scr 1.04, Crcl 75 ml/min    Goal of Therapy:  Heparin level 0.3-0.7 units/ml Monitor platelets by anticoagulation protocol: Yes   Plan:  Heparin 5000 unit bolus followed by heparin 1400 units/hr. HL 6 hours after start of infusion Daily CBC while on heparin  Monitor for signs and symptoms of bleeding  Royetta Asal, PharmD, BCPS 08/04/2020 8:32 PM

## 2020-08-05 ENCOUNTER — Inpatient Hospital Stay (HOSPITAL_COMMUNITY): Payer: Medicare Other

## 2020-08-05 DIAGNOSIS — R0600 Dyspnea, unspecified: Secondary | ICD-10-CM

## 2020-08-05 DIAGNOSIS — J849 Interstitial pulmonary disease, unspecified: Secondary | ICD-10-CM

## 2020-08-05 DIAGNOSIS — I2699 Other pulmonary embolism without acute cor pulmonale: Principal | ICD-10-CM

## 2020-08-05 LAB — CBC
HCT: 38.5 % — ABNORMAL LOW (ref 39.0–52.0)
Hemoglobin: 13.1 g/dL (ref 13.0–17.0)
MCH: 32.2 pg (ref 26.0–34.0)
MCHC: 34 g/dL (ref 30.0–36.0)
MCV: 94.6 fL (ref 80.0–100.0)
Platelets: 244 10*3/uL (ref 150–400)
RBC: 4.07 MIL/uL — ABNORMAL LOW (ref 4.22–5.81)
RDW: 12.8 % (ref 11.5–15.5)
WBC: 10.9 10*3/uL — ABNORMAL HIGH (ref 4.0–10.5)
nRBC: 0 % (ref 0.0–0.2)

## 2020-08-05 LAB — BASIC METABOLIC PANEL
Anion gap: 9 (ref 5–15)
BUN: 11 mg/dL (ref 8–23)
CO2: 26 mmol/L (ref 22–32)
Calcium: 8.7 mg/dL — ABNORMAL LOW (ref 8.9–10.3)
Chloride: 101 mmol/L (ref 98–111)
Creatinine, Ser: 0.85 mg/dL (ref 0.61–1.24)
GFR, Estimated: >60 mL/min (ref >60)
Glucose, Bld: 93 mg/dL (ref 70–99)
Potassium: 3.7 mmol/L (ref 3.5–5.1)
Sodium: 136 mmol/L (ref 135–145)

## 2020-08-05 LAB — SEDIMENTATION RATE: Sed Rate: 61 mm/hr — ABNORMAL HIGH (ref 0–16)

## 2020-08-05 LAB — HEPARIN LEVEL (UNFRACTIONATED)
Heparin Unfractionated: 0.22 IU/mL — ABNORMAL LOW (ref 0.30–0.70)
Heparin Unfractionated: 0.24 IU/mL — ABNORMAL LOW (ref 0.30–0.70)

## 2020-08-05 LAB — TROPONIN I (HIGH SENSITIVITY): Troponin I (High Sensitivity): 173 ng/L (ref <18)

## 2020-08-05 LAB — SARS CORONAVIRUS 2 (TAT 6-24 HRS): SARS Coronavirus 2: NEGATIVE

## 2020-08-05 LAB — HIV ANTIBODY (ROUTINE TESTING W REFLEX): HIV Screen 4th Generation wRfx: NONREACTIVE

## 2020-08-05 MED ORDER — PANTOPRAZOLE SODIUM 40 MG PO TBEC
40.0000 mg | DELAYED_RELEASE_TABLET | Freq: Two times a day (BID) | ORAL | Status: DC
  Administered 2020-08-05 – 2020-08-08 (×6): 40 mg via ORAL
  Filled 2020-08-05 (×6): qty 1

## 2020-08-05 MED ORDER — HEPARIN BOLUS VIA INFUSION
1200.0000 [IU] | Freq: Once | INTRAVENOUS | Status: AC
  Administered 2020-08-05: 1200 [IU] via INTRAVENOUS
  Filled 2020-08-05: qty 1200

## 2020-08-05 MED ORDER — HEPARIN (PORCINE) 25000 UT/250ML-% IV SOLN
1550.0000 [IU]/h | INTRAVENOUS | Status: DC
  Administered 2020-08-05 (×2): 1550 [IU]/h via INTRAVENOUS
  Filled 2020-08-05: qty 250

## 2020-08-05 MED ORDER — HEPARIN (PORCINE) 25000 UT/250ML-% IV SOLN
1850.0000 [IU]/h | INTRAVENOUS | Status: AC
  Administered 2020-08-05 – 2020-08-06 (×3): 1700 [IU]/h via INTRAVENOUS
  Administered 2020-08-07: 1850 [IU]/h via INTRAVENOUS
  Filled 2020-08-05 (×4): qty 250

## 2020-08-05 NOTE — Plan of Care (Signed)
  Problem: Clinical Measurements: Goal: Ability to maintain clinical measurements within normal limits will improve Outcome: Progressing Goal: Will remain free from infection Outcome: Progressing Goal: Respiratory complications will improve Outcome: Progressing   Problem: Coping: Goal: Level of anxiety will decrease Outcome: Progressing   Problem: Pain Managment: Goal: General experience of comfort will improve Outcome: Progressing

## 2020-08-05 NOTE — Progress Notes (Addendum)
ANTICOAGULATION CONSULT NOTE - Follow Up Consult  Pharmacy Consult for heparin Indication: pulmonary embolus  No Known Allergies  Patient Measurements: Height: 6\' 2"  (188 cm) Weight: 81.7 kg (180 lb 3.2 oz) IBW/kg (Calculated) : 82.2 Heparin Dosing Weight: n/a. Use TBW = 82 kg  Vital Signs: Temp: 98.5 F (36.9 C) (07/03 0600) Temp Source: Oral (07/03 0600) BP: 127/79 (07/03 0600) Pulse Rate: 80 (07/03 0600)  Labs: Recent Labs    08/04/20 1639 08/04/20 1839 08/04/20 2235 08/05/20 0659  HGB 15.5  --   --  13.1  HCT 46.1  --   --  38.5*  PLT 301  --   --  244  HEPARINUNFRC  --   --   --  0.22*  CREATININE 1.04  --   --  0.85  TROPONINIHS 95* 225* 173*  --     Estimated Creatinine Clearance: 94.8 mL/min (by C-G formula based on SCr of 0.85 mg/dL).   Medications: No anticoagulants PTA  Assessment: Pt is a 28 yoM presenting with SOB, chest pain. PMH significant for recent orthopaedic shoulder surgery on 07/26/20. CTA revealed small PE, pharmacy consulted to dose heparin.   Today, 08/05/20 HL = 0.22 is subtherapeutic on heparin infusion of 1400 units/hr Confirmed with RN that heparin infusing at correct rate with no issues/interruptions. No signs of bleeding.  CBC: WNL  Goal of Therapy:  Heparin level 0.3-0.7 units/ml Monitor platelets by anticoagulation protocol: Yes   Plan:  Heparin bolus of 1200 units IV once Increase rate to 1550 units/hr Check 6 hour HL HL, CBC daily while on  heparin  Monitor for signs of bleeding  Lenis Noon, PharmD 08/05/2020,8:30 AM  Addendum - Evening Follow Up:  Assessment: HL = 0.24 remains subtherapeutic despite increasing rate of heparin infusion to 1550 units/hr Confirmed with RN that heparin infusing at correct rate. No interruptions/issues with infusion. No signs of bleeding.   Goal:  HL 0.3 - 0.7  Plan: Heparin bolus of 1200 units IV once Increase rate of heparin infusion to 1700 units/hr Check 6 hour HL Monitor for  signs of bleeding  Lenis Noon, PharmD 08/05/20 6:32 PM

## 2020-08-05 NOTE — Progress Notes (Signed)
Bilateral lower extremity venous duplex has been completed. Preliminary results can be found in CV Proc through chart review.   08/05/20 4:01 PM Paul Roth RVT

## 2020-08-05 NOTE — Plan of Care (Signed)
  Problem: Education: Goal: Knowledge of General Education information will improve Description: Including pain rating scale, medication(s)/side effects and non-pharmacologic comfort measures Reactivated

## 2020-08-05 NOTE — Consult Note (Signed)
NAME:  Paul Roth, MRN:  [id number], DOB:  02-13-1950, LOS: 1 ADMISSION DATE:  08/04/2020, CONSULTATION DATE:  7/3  REFERRING MD:  Tonita Phoenix, CHIEF COMPLAINT:  dyspnea/ pulmonary infiltrates   History of Present Illness:  58 yowm remote smoker eval by Dr Shearon Stalls 07/17/20 to clear for  R shoulder surgery with >?  Evolving chronic ILD (x 5 y hx) admitted 7/2 with acute on chronic sob with following hx   70 y.o. male with medical history significant for HLD, interstitial lung disease, OA who presents for evaluation of shortness of breath associated with left-sided chest pain and diaphoresis.  Patient had right shoulder surgery 2 weeks ago which he hadbeen at home recovering from.  He had been using hydrocodone for pain which he had been weaning himself off.  2 days PTA he did some light yard work of picking blueberries and using his left arm.  The next day he noticed some left-sided chest pain that was not severe and did not alter his activities.  Night prior he noticed some shortness of breath that was worse with exertion and ambulation along with the chest pain.  This morning when he woke up the left-sided chest pain and shortness of breath became severe and was associated with diaphoresis.   Taking a deep breath or moving his left arm worsened the pain.  Shortness of breath improved with  sit down and rest.  He does report a chronic cough and postnasal drip which has not changed.  He has not had any fevers chills, swelling of his legs, recent travel.  He reported when he was 70 year old had a collapsed lung and states this pain felt similar to that.  When the pain was severe he did take hydrocodone at home which helped relieve the pain.  Pain at worst was a 9 out of 10  .  States he has never had blood clots or pulmonary embolisms. He has a history of smoking but quit in 1990.  He has a history of smoking marijuana but quit this over 10 years ago.  He denies alcohol. Lives with his wife. No known  recent sick contacts   ED Course: Mr. Siever has been hemodynamically stable since arrival in the emergency room.  He is currently on room air and has oxygen saturation of 97 to 100%.  His EKG showed no acute ischemia.  Troponin was elevated at 95.  WBC is 12,800, hemoglobin 15.5, hematocrit 46.1, platelets 301,000.  Sodium is 135, potassium 4.1, chloride 97, bicarb 26, creatinine 1.04, BUN 15, glucose 107.  Chest x-ray showed an opacity in the left upper lobe which was larger than the previous chest x-ray.  CT angiography was obtained which showed left-sided small pulmonary emboli.  Patient also has a left upper lobe mass which favors neoplasm but could also have component of pneumonia.  Patient was placed on heparin infusion in the emergency room for therapeutic anticoagulation.  Patient was given antibiotics with Rocephin and azithromycin.  Hospitalist service was asked to admit patient for further management.  I have called and discussed case with pulmonology who will see patient in the morning in consultation     Significant Hospital Events: Including procedures, antibiotic start and stop dates in addition to other pertinent events   CTa chest 7/2: 1. Small intraluminal filling defects in the left central pulmonary arteries extending into the segmental branches, consistent with small pulmonary emboli. Some of these filling defects are contiguous with left hilar adenopathy and left hilar soft  tissue density.  Masslike opacity in the periphery of the left upper lobe measure approximately 4.4 cm. Differential considerations include neoplasm or infection. Mediastinal and left hilar adenopathy(largest 56mm). Small left pleural effusion. Findings of interstitial lung disease with basilar and periphera predominant fibrosis/honeycombing. Nonspecific hypodensities in the liver, largest in the left lobemeasuring 17 mm.   Scheduled Meds:  aspirin EC  81 mg Oral Daily   atorvastatin  10 mg Oral Daily    nortriptyline  75 mg Oral QHS   Continuous Infusions:  azithromycin Stopped (08/04/20 2047)   cefTRIAXone (ROCEPHIN)  IV 1 g (08/05/20 0954)   heparin 1,550 Units/hr (08/05/20 1325)   lactated ringers 100 mL/hr at 08/05/20 1146   PRN Meds:.acetaminophen **OR** acetaminophen, HYDROcodone-acetaminophen, ipratropium-albuterol, ondansetron **OR** ondansetron (ZOFRAN) IV, polyethylene glycol    Interim History / Subjective:  Still having periods of sob at rest esp noct/ minimal cough mucoid nothing purulent or bloody   Objective   Blood pressure 124/84, pulse 67, temperature 98.5 F (36.9 C), resp. rate 20, height 6\' 2"  (1.88 m), weight 81.7 kg, SpO2 100 %.        Intake/Output Summary (Last 24 hours) at 08/05/2020 1319 Last data filed at 08/05/2020 1155 Gross per 24 hour  Intake 1331.2 ml  Output 1700 ml  Net -368.8 ml   Filed Weights   08/04/20 2155  Weight: 81.7 kg    Examination: General: pleasant elderly wm nad Tmax  99.2  Vital signs reviewed  08/04/2020  - Note at rest 02 sats  98% on 2lpm NP     HENT: intact  Lungs: bilateral L >R basilar crackles. Slt bronchial changes Cardiovascular: RRR no increased P2 Abdomen: soft, benign Extremities: mod clubbing  Neuro: intact      Assessment & Plan:  1)  acute on chronic sob  in pt with underlying ILD (? X 5y by hx) with acute findings c/w pe  plus ? Pna vs infarction and strongly doubt malignancy clinically or radiographically >>> continue heparin and empiric rx for cap >>> max rx for gerd (see#2)    2) ILD / pt with clubbing so UIP in ddx and plan was for HRCT but will hold off for now with that w/u until sort out the acute issues above.  >>L send labs for HSP/ collagen vasc dz >>> in meantime Use of PPI is associated with improved survival time and with decreased radiologic fibrosis per King's study published in AJRCCM vol 184 p1390.  Dec 2011 and also may have other beneficial effects as per the latest review in [place]  vol 193 W0981 Jun 20016.  This may not always be cause and effect but he has h/o gerd so rec start  rx ppi / diet/ lifestyle modification and f/u with serial walking sats and lung volumes for now to put more points on the curve / establish firm baseline before considering additional measures.   3) 02 dep resp failure > suspect he may need      Labs   CBC: Recent Labs  Lab 08/04/20 1639 08/05/20 0659  WBC 12.8* 10.9*  NEUTROABS 9.2*  --   HGB 15.5 13.1  HCT 46.1 38.5*  MCV 93.9 94.6  PLT 301 191    Basic Metabolic Panel: Recent Labs  Lab 08/04/20 1639 08/05/20 0659  NA 135 136  K 4.1 3.7  CL 97* 101  CO2 26 26  GLUCOSE 107* 93  BUN 15 11  CREATININE 1.04 0.85  CALCIUM 9.5 8.7*  GFR: Estimated Creatinine Clearance: 94.8 mL/min (by C-G formula based on SCr of 0.85 mg/dL). Recent Labs  Lab 08/04/20 1639 08/05/20 0659  WBC 12.8* 10.9*    Liver Function Tests: No results for input(s): AST, ALT, ALKPHOS, BILITOT, PROT, ALBUMIN in the last 168 hours. No results for input(s): LIPASE, AMYLASE in the last 168 hours. No results for input(s): AMMONIA in the last 168 hours.  ABG No results found for: PHART, PCO2ART, PO2ART, HCO3, TCO2, ACIDBASEDEF, O2SAT   Coagulation Profile: No results for input(s): INR, PROTIME in the last 168 hours.  Cardiac Enzymes: No results for input(s): CKTOTAL, CKMB, CKMBINDEX, TROPONINI in the last 168 hours.  HbA1C: No results found for: HGBA1C  CBG: No results for input(s): GLUCAP in the last 168 hours.     Past Medical History:  He,  has a past medical history of Arthritis, Dyspnea, Hyperlipidemia, and Tinnitus.   Surgical History:   Past Surgical History:  Procedure Laterality Date   EYE SURGERY     TOTAL SHOULDER ARTHROPLASTY Right 07/26/2020   Procedure: TOTAL SHOULDER ARTHROPLASTY;  Surgeon: Tania Ade, MD;  Location: [facility];  Service: Orthopedics;  Laterality: Right;     Social History:   reports that he quit  smoking about 32 years ago. His smoking use included cigarettes. He has a 35.00 pack-year smoking history. He has never used smokeless tobacco. He reports current alcohol use. He reports previous drug use. Drug: Marijuana.   Family History:  His family history includes Lung disease in his maternal aunt and mother.   Allergies No Known Allergies   Home Medications  Prior to Admission medications   Medication Sig Start Date End Date Taking? Authorizing Provider  ascorbic acid (VITAMIN C) 100 MG tablet Take 100 mg by mouth daily.   Yes [provider]  atorvastatin (LIPITOR) 10 MG tablet Take 10 mg by mouth daily. 05/15/20  Yes [provider]  HYDROcodone-acetaminophen (NORCO/VICODIN) 5-325 MG tablet Take 1 tablet by mouth every 6 (six) hours as needed for moderate pain or severe pain. 08/01/20  Yes [provider]  MAGNESIUM CITRATE PO Take 1 Scoop by mouth daily.   Yes [provider]  nortriptyline (PAMELOR) 75 MG capsule Take 75 mg by mouth at bedtime. 06/23/20  Yes [provider]  ondansetron (ZOFRAN) 4 MG tablet Take 4 mg by mouth every 8 (eight) hours as needed for vomiting or nausea. 08/01/20  Yes [provider]  prednisoLONE acetate (PRED FORTE) 1 % ophthalmic suspension Place 1 drop into the right eye daily. 06/15/20  Yes [provider]  RESVERATROL PO Take 1 capsule by mouth daily.   Yes [provider]  tiZANidine (ZANAFLEX) 2 MG tablet Take 1 tablet (2 mg total) by mouth every 8 (eight) hours as needed (as needed for muscle pain). 07/26/20  Yes [place], Museum/gallery conservator, PA-C  VITAMIN D, CHOLECALCIFEROL, PO Take 1 tablet by mouth daily.   Yes [provider]       Christinia Gully, MD [facility] [phone]   After 7:00 pm call Elink  [phone]

## 2020-08-05 NOTE — Progress Notes (Signed)
PROGRESS NOTE    Paul Roth  WGN:[id number] DOB: [birth year] DOA: 08/04/2020 PCP: Bernerd Limbo, MD   Brief Narrative:  This 70 years old male with PMH significant for hyperlipidemia, interstitial lung disease, osteoarthritis presented in the ED with complaints of shortness of breath associated with left-sided chest pain and diaphoresis.  Patient reports when he was 70 years old,  he had a collapsed lung and states this pain felt similar to that.  He is hemodynamically stable.  CTA chest was obtained which shows left sided small pulmonary emboli,  there is a left upper lobe mass which favors neoplasm but could also be a pneumonia.  Patient started on antibiotics(ceftriaxone and Zithromax for possible community-acquired pneumonia). Patient also started on IV heparin for pulmonary embolism,  pulmonology consulted , whether patient needs a biopsy for lung mass. 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 CTA chest confirms small pulmonary embolism and lung mass. Continue heparin infusion, will transition to oral anticoagulation before discharge. Continue supplemental oxygen if needed.    Lung mass: CT chest showed left upper lobe mass could be pneumonia versus lung mass. Pulmonology consulted, will determine need for bronchoscopy and lung biopsy. Continue antibiotics ceftriaxone and Zithromax.  Non-STEMI: Patient presented with chest pain, SOB, elevated troponin. This could be due to demand ischemia,  troponin is trending down. Continue aspirin and statins daily.  Incisional lung disease: Continue inhalers  Leukocytosis: This could be due to pneumonia Continue Zithromax and Rocephin.  DVT  prophylaxis: Heparin Code Status: (Full code) Family Communication: No family at bedside Disposition Plan:    Status is: Inpatient  Remains inpatient appropriate because:Inpatient level of care appropriate due to severity of illness  Dispo: The patient is from: Home              Anticipated d/c is to: Home              Patient currently is not medically stable to d/c.   Difficult to place patient No   Consultants:  Pulmonology  Procedures: CTA Chest Antimicrobials:   Anti-infectives (From admission, onward)    Start     Dose/Rate Route Frequency Ordered Stop   08/04/20 2030  cefTRIAXone (ROCEPHIN) 1 g in sodium chloride 0.9 % 100 mL IVPB        1 g 200 mL/hr over 30 Minutes Intravenous Every 24 hours 08/04/20 2024     08/04/20 2030  azithromycin (ZITHROMAX) 500 mg in sodium chloride 0.9 % 250 mL IVPB        500 mg 250 mL/hr over 60 Minutes Intravenous Every 24 hours 08/04/20 2024     08/04/20 1915  cefTRIAXone (ROCEPHIN) 1 g in sodium chloride 0.9 % 100 mL IVPB        1 g 200 mL/hr over 30 Minutes Intravenous  Once 08/04/20 1914 08/04/20 2104   08/04/20 1915  azithromycin (ZITHROMAX) 500 mg in sodium chloride 0.9 % 250 mL IVPB        500 mg 250 mL/hr over 60 Minutes Intravenous  Once 08/04/20 1914 08/04/20 2138        Subjective: Patient was seen and examined at bedside.  Overnight events noted.  Patient reports feeling much improved. He reports  chest pain has significantly improved, he still feels short of breath with exertion.  Objective: Vitals:   08/04/20 2155 08/05/20 0130 08/05/20 0600 08/05/20 1153  BP:  126/83 127/79 124/84  Pulse:  86 80 67  Resp:   20 20  Temp:  99.2 F (37.3 C) 98.5 F (36.9 C) 98.5 F (36.9 C)  TempSrc:  Oral Oral   SpO2:  98% 98% 100%  Weight: 81.7 kg     Height: 6\' 2"  (1.88 m)       Intake/Output Summary (Last 24 hours) at 08/05/2020 1343 Last data filed at 08/05/2020 1155 Gross per 24 hour  Intake 1331.2 ml  Output 1700 ml   Net -368.8 ml   Filed Weights   08/04/20 2155  Weight: 81.7 kg    Examination:  General exam: Appears calm and comfortable, not in any acute distress. Respiratory system: Clear to auscultation. Respiratory effort normal. Cardiovascular system: S1 & S2 heard, RRR. No JVD, murmurs, rubs, gallops or clicks. No pedal edema. Gastrointestinal system: Abdomen is nondistended, soft and nontender. No organomegaly or masses felt. Normal bowel sounds heard. Central nervous system: Alert and oriented. No focal neurological deficits. Extremities: Symmetric 5 x 5 power.  No edema, no cyanosis, no clubbing. Skin: No rashes, lesions or ulcers Psychiatry: Judgement and insight appear normal. Mood & affect appropriate.     Data Reviewed: I have personally reviewed following labs and imaging studies  CBC: Recent Labs  Lab 08/04/20 1639 08/05/20 0659  WBC 12.8* 10.9*  NEUTROABS 9.2*  --   HGB 15.5 13.1  HCT 46.1 38.5*  MCV 93.9 94.6  PLT 301 397   Basic Metabolic Panel: Recent Labs  Lab 08/04/20 1639 08/05/20 0659  NA 135 136  K 4.1 3.7  CL 97* 101  CO2 26 26  GLUCOSE 107* 93  BUN 15 11  CREATININE 1.04 0.85  CALCIUM 9.5 8.7*   GFR: Estimated Creatinine Clearance: 94.8 mL/min (by C-G formula based on SCr of 0.85 mg/dL). Liver Function Tests: No results for input(s): AST, ALT, ALKPHOS, BILITOT, PROT, ALBUMIN in the last 168 hours. No results for input(s): LIPASE, AMYLASE in the last 168 hours. No results for input(s): AMMONIA in the last 168 hours. Coagulation Profile: No results for input(s): INR, PROTIME in the last 168 hours. Cardiac Enzymes: No results for input(s): CKTOTAL, CKMB, CKMBINDEX, TROPONINI in the last 168 hours. BNP (last 3 results) No results for input(s): PROBNP in the last 8760 hours. HbA1C: No results for input(s): HGBA1C in the last 72 hours. CBG: No results for input(s): GLUCAP in the last 168 hours. Lipid Profile: No results for input(s): CHOL,  HDL, LDLCALC, TRIG, CHOLHDL, LDLDIRECT in the last 72 hours. Thyroid Function Tests: No results for input(s): TSH, T4TOTAL, FREET4, T3FREE, THYROIDAB in the last 72 hours. Anemia Panel: No results for input(s): VITAMINB12, FOLATE, FERRITIN, TIBC, IRON, RETICCTPCT in the last 72 hours. Sepsis Labs: No results for input(s): PROCALCITON, LATICACIDVEN in the last 168 hours.  Recent Results (from the past 240 hour(s))  SARS CORONAVIRUS 2 (TAT 6-24 HRS) Nasopharyngeal Nasopharyngeal Swab     Status: None   Collection Time: 08/04/20  7:15 PM   Specimen: Nasopharyngeal Swab  Result Value Ref Range Status   SARS Coronavirus 2 NEGATIVE NEGATIVE Final    Comment: (NOTE) SARS-CoV-2 target nucleic acids are NOT DETECTED.  The SARS-CoV-2 RNA is generally detectable in upper and lower respiratory specimens during the acute phase of infection. Negative results do not preclude SARS-CoV-2 infection,  do not rule out co-infections with other pathogens, and should not be used as the sole basis for treatment or other patient management decisions. Negative results must be combined with clinical observations, patient history, and epidemiological information. The expected result is Negative.  Fact Sheet for Patients: SugarRoll.be  Fact Sheet for Healthcare Providers: https://www.woods-mathews.com/  This test is not yet approved or cleared by the [place] FDA and  has been authorized for detection and/or diagnosis of SARS-CoV-2 by FDA under an Emergency Use Authorization (EUA). This EUA will remain  in effect (meaning this test can be used) for the duration of the COVID-19 declaration under Se ction 564(b)(1) of the Act, 21 U.S.C. section 360bbb-3(b)(1), unless the authorization is terminated or revoked sooner.  Performed at [facility], [place] [address], [place], [place] [address]   Culture, blood (routine x 2)     Status: None (Preliminary result)    Collection Time: 08/04/20  7:50 PM   Specimen: BLOOD LEFT FOREARM  Result Value Ref Range Status   Specimen Description   Final    BLOOD LEFT FOREARM Performed at [facility], [place] [address] [place], [place] [address]    Special Requests   Final    BOTTLES DRAWN AEROBIC AND ANAEROBIC Blood Culture adequate volume Performed at [facility] [address], [place], [place] [address]    Culture PENDING  Incomplete   Report Status PENDING  Incomplete    Radiology Studies: CT Angio Chest PE W and/or Wo Contrast  Result Date: 08/04/2020 CLINICAL DATA:  PE suspected, high prob Intermittent left-sided chest pain with increasing shortness of breath. Shoulder surgery 10 days ago. EXAM: CT ANGIOGRAPHY CHEST WITH CONTRAST TECHNIQUE: Multidetector CT imaging of the chest was performed using the standard protocol during bolus administration of intravenous contrast. Multiplanar CT image reconstructions and MIPs were obtained to evaluate the vascular anatomy. CONTRAST:  176mL OMNIPAQUE IOHEXOL 350 MG/ML SOLN COMPARISON:  Radiograph earlier today. Radiograph 07/20/2020 also reviewed. FINDINGS: Cardiovascular: There is lobulated soft tissue density at the left hilum, which causes mass effect on the distal left pulmonary artery. Adjacent to this lobulated soft tissue density are small intraluminal filling defects within the left upper lobe and lingular segmental pulmonary arterial branches. The left upper lobe pulmonary arteries are attenuated. There are no filling defects within the right pulmonary arteries. Dilated central pulmonary artery at 3.5 cm. Tortuous atherosclerotic thoracic aorta without dissection. Heart size normal. No pericardial effusion. Mediastinum/Nodes: Multiple enlarged left hilar lymph nodes including a 15 mm node series 4, image 67. Enlarged AP window node measures 18 mm, series 4, image 51. There prominent prevascular nodes measuring up to 9 mm. Left lower  paratracheal node measures 17 mm, series 4, image 54. There is enlarged subcarinal node at 18 mm. Borderline right hilar node at 11 mm, series 4, image 67. Tiny hiatal hernia. Lungs/Pleura: Findings of interstitial lung disease with basilar and peripheral predominant honeycombing. There is a an area of masslike opacity in the perifissural left upper lobe peripherally. Exact dimensions are difficult due to the ill-defined nature, however this measures at least 4.4 x 1.9 x 4 cm, measured on series 6, image 42 and series 7, image 64. Surrounding ground-glass and ill-defined opacity, as well as coursing along the left inter lobar fissure and projecting into the superior segment of the left lower lobe, series 6, image 37. There is a small left pleural effusion. No definite pleural thickening or enhancement. Minimal retained mucus in  the trachea and central bronchi. Upper Abdomen: Nonspecific hypodensities in the liver, largest in the left lobe measuring 17 mm, series 4, image 134. Water density lesion arising from the posterior right kidney measures 2.9 cm. There additional low-density lesions in the right kidney that are incompletely characterized. Mild left adrenal thickening. No dominant adrenal nodule. Musculoskeletal: Right humeral head arthroplasty. Endplate irregularity with sclerosis and likely adjacent Schmorl's nodes at T12-L1. No discrete bone lesion. Review of the MIP images confirms the above findings. IMPRESSION: 1. Small intraluminal filling defects in the left central pulmonary arteries extending into the segmental branches, consistent with small pulmonary emboli. Some of these filling defects are contiguous with left hilar adenopathy and left hilar soft tissue density. 2. Masslike opacity in the periphery of the left upper lobe measures approximately 4.4 cm. Differential considerations include neoplasm or infection. 3. Mediastinal and left hilar adenopathy. 4. Small left pleural effusion. 5. Findings of  interstitial lung disease with basilar and peripheral predominant fibrosis/honeycombing. 6. Nonspecific hypodensities in the liver, largest in the left lobe measuring 17 mm. 7. Right renal cyst as well as low-density lesions in the right kidney that are incompletely characterized. Aortic Atherosclerosis (ICD10-I70.0). Critical Value/emergent results were called by telephone at the time of interpretation on 08/04/2020 at 7:06 pm to provider Sherwood Gambler , who verbally acknowledged these results. Electronically Signed   By: Keith Rake M.D.   On: 08/04/2020 19:07   DG Chest Portable 1 View  Result Date: 08/04/2020 CLINICAL DATA:  Progressive shortness of breath the last 2 days. EXAM: PORTABLE CHEST 1 VIEW COMPARISON:  07/21/2015. FINDINGS: Heart size and mediastinal contours are unremarkable. No pleural effusion identified. Diffuse chronic appearing reticular interstitial opacities are noted throughout both lungs which is concerning for fibrotic lung disease. Superimposed airspace opacity within the left upper lobe is concerning for pneumonia. This is increased when compared with previous study. Status post right shoulder arthroplasty. Chronic degenerative changes are noted involving the left shoulder. IMPRESSION: 1. Left upper lobe airspace opacity, increased when compared with previous study. This is worrisome for pneumonia. Followup PA and lateral chest X-ray is recommended in 3-4 weeks following trial of antibiotic therapy to ensure resolution and exclude underlying malignancy. 2. Chronic interstitial lung disease. Recommend follow-up imaging with nonemergent high-resolution CT of the chest following resolution of this acute episode for more definitive diagnosis. Electronically Signed   By: Kerby Moors M.D.   On: 08/04/2020 17:40    Scheduled Meds:  aspirin EC  81 mg Oral Daily   atorvastatin  10 mg Oral Daily   nortriptyline  75 mg Oral QHS   Continuous Infusions:  azithromycin Stopped  (08/04/20 2047)   cefTRIAXone (ROCEPHIN)  IV 1 g (08/05/20 0954)   heparin 1,550 Units/hr (08/05/20 1325)   lactated ringers 100 mL/hr at 08/05/20 1146     LOS: 1 day    Time spent: 35 mins    Kassey Laforest, MD [facility]   If 7PM-7AM, please contact night-coverage

## 2020-08-06 ENCOUNTER — Inpatient Hospital Stay (HOSPITAL_COMMUNITY): Payer: Medicare Other

## 2020-08-06 DIAGNOSIS — I2602 Saddle embolus of pulmonary artery with acute cor pulmonale: Secondary | ICD-10-CM

## 2020-08-06 LAB — CBC
HCT: 38.5 % — ABNORMAL LOW (ref 39.0–52.0)
Hemoglobin: 13 g/dL (ref 13.0–17.0)
MCH: 31.6 pg (ref 26.0–34.0)
MCHC: 33.8 g/dL (ref 30.0–36.0)
MCV: 93.4 fL (ref 80.0–100.0)
Platelets: 240 10*3/uL (ref 150–400)
RBC: 4.12 MIL/uL — ABNORMAL LOW (ref 4.22–5.81)
RDW: 12.9 % (ref 11.5–15.5)
WBC: 9.7 10*3/uL (ref 4.0–10.5)
nRBC: 0 % (ref 0.0–0.2)

## 2020-08-06 LAB — ECHOCARDIOGRAM COMPLETE
Area-P 1/2: 2.7 cm2
Height: 74 in
S' Lateral: 3 cm
Weight: 2883.2 oz

## 2020-08-06 LAB — HEPARIN LEVEL (UNFRACTIONATED)
Heparin Unfractionated: 0.41 IU/mL (ref 0.30–0.70)
Heparin Unfractionated: 0.4 IU/mL (ref 0.30–0.70)

## 2020-08-06 NOTE — Progress Notes (Signed)
ANTICOAGULATION CONSULT NOTE - Follow Up Consult  Pharmacy Consult for heparin Indication: pulmonary embolus  No Known Allergies  Patient Measurements: Height: 6\' 2"  (188 cm) Weight: 81.7 kg (180 lb 3.2 oz) IBW/kg (Calculated) : 82.2 Heparin Dosing Weight: TBW  Vital Signs: Temp: 98.1 F (36.7 C) (07/04 0442) Temp Source: Oral (07/04 0442) BP: 121/78 (07/04 0442) Pulse Rate: 64 (07/04 0442)  Labs: Recent Labs    08/04/20 1639 08/04/20 1639 08/04/20 1839 08/04/20 2235 08/05/20 0659 08/05/20 1613 08/06/20 0133 08/06/20 0721  HGB 15.5  --   --   --  13.1  --  13.0  --   HCT 46.1  --   --   --  38.5*  --  38.5*  --   PLT 301  --   --   --  244  --  240  --   HEPARINUNFRC  --    < >  --   --  0.22* 0.24* 0.41 0.40  CREATININE 1.04  --   --   --  0.85  --   --   --   TROPONINIHS 95*  --  225* 173*  --   --   --   --    < > = values in this interval not displayed.     Estimated Creatinine Clearance: 94.8 mL/min (by C-G formula based on SCr of 0.85 mg/dL).   Medications: No anticoagulants PTA  azithromycin 500 mg (08/05/20 2010)   cefTRIAXone (ROCEPHIN)  IV 1 g (08/05/20 0954)   heparin 1,700 Units/hr (08/06/20 0627)   lactated ringers 100 mL/hr at 08/05/20 1146     Assessment: Pt is a 67 yoM presenting with SOB, chest pain. PMH significant for recent orthopaedic shoulder surgery on 07/26/20. CTA revealed small PE, pharmacy consulted to dose heparin.   Today, 08/06/20 HL = 0.4, remains therapeutic on heparin infusion of 1700 units/hr CBC: stable, WNL No complications of therapy noted  Goal of Therapy:  Heparin level 0.3-0.7 units/ml Monitor platelets by anticoagulation protocol: Yes   Plan:  Continue IV heparin 1700 units/hr HL, CBC daily while on heparin  Monitor for signs of bleeding Follow up transition to oral anticoagulation.   Gretta Arab PharmD, BCPS Clinical Pharmacist [facility] main pharmacy [phone] 08/06/2020 8:18 AM

## 2020-08-06 NOTE — Progress Notes (Addendum)
PROGRESS NOTE    Paul Roth  SWF:[id number] DOB: Nov 21, 1950 DOA: 08/04/2020 PCP: Bernerd Limbo, MD   Brief Narrative:  This 70 years old male with PMH significant for hyperlipidemia, interstitial lung disease, osteoarthritis presented in the ED with complaints of shortness of breath associated with left-sided chest pain and diaphoresis.  Patient reports when he was 70 years old,  he had a collapsed lung and states this pain felt similar to that.  He is hemodynamically stable.  CTA chest was obtained which shows left sided small pulmonary emboli,  there is a left upper lobe mass which favors neoplasm but could also be a pneumonia.  Patient started on antibiotics (ceftriaxone and Zithromax for possible community-acquired pneumonia). Patient also started on IV heparin for pulmonary embolism,  pulmonology consulted , thinks this could be a pneumonia or GERD. Maximize GERD therapy, complete antibiotics for CAP.  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 CTA chest confirms small pulmonary embolism and lung mass. Continue heparin infusion, will transition to oral anticoagulation before discharge. Continue supplemental oxygen if needed.  We will transition to Eliquis today. Venous duplex negative for DVT, echocardiogram pending.  Lung mass: CT chest showed left upper lobe mass could be pneumonia versus lung mass. Pulmonology consulted, will determine need for bronchoscopy and lung biopsy. Pulmonology recommended maximize GERD therapy, complete antibiotics for CAP. Continue antibiotics ceftriaxone and Zithromax ( 5 Days).  Non-STEMI: Patient presented with chest pain, SOB, elevated troponin. This could  be due to demand ischemia,  troponin is trending down. Continue aspirin and statins daily.   Interstitial lung disease: Continue inhalers.  Leukocytosis: This could be due to pneumonia Continue Zithromax and Rocephin.  DVT prophylaxis: Heparin gtt Code Status: (Full code) Family Communication: No family at bedside Disposition Plan:   Status is: Inpatient  Remains inpatient appropriate because:Inpatient level of care appropriate due to severity of illness  Dispo: The patient is from: Home              Anticipated d/c is to: Home              Patient currently is not medically stable to d/c.   Difficult to place patient No   Consultants:  Pulmonology  Procedures: CTA Chest Antimicrobials:   Anti-infectives (From admission, onward)    Start     Dose/Rate Route Frequency Ordered Stop   08/04/20 2030  cefTRIAXone (ROCEPHIN) 1 g in sodium chloride 0.9 % 100 mL IVPB        1 g 200 mL/hr over 30 Minutes Intravenous Every 24 hours 08/04/20 2024     08/04/20 2030  azithromycin (ZITHROMAX) 500 mg in sodium chloride 0.9 % 250 mL IVPB        500 mg 250 mL/hr over 60 Minutes Intravenous Every 24 hours 08/04/20 2024     08/04/20 1915  cefTRIAXone (ROCEPHIN) 1 g in sodium chloride 0.9 % 100 mL IVPB        1 g 200 mL/hr over 30 Minutes Intravenous  Once 08/04/20 1914 08/04/20 2104   08/04/20 1915  azithromycin (ZITHROMAX) 500 mg in sodium chloride 0.9 % 250 mL IVPB        500 mg 250 mL/hr over 60 Minutes  Intravenous  Once 08/04/20 1914 08/04/20 2138        Subjective: Patient was seen and examined at bedside.  Overnight events noted.   Patient reports feeling much improved. He c/o: Right shoulder pain,  He denies chest pain, shortness of breath.  Objective: Vitals:   08/05/20 1153 08/05/20 1437 08/05/20 2021 08/06/20 0442  BP: 124/84 120/78 137/85 121/78  Pulse: 67 82 79 64  Resp: 20 20 20 20   Temp: 98.5 F (36.9 C) 97.9 F (36.6 C) 98.2 F (36.8 C) 98.1 F (36.7 C)   TempSrc:   Oral Oral  SpO2: 100% 98% 97%   Weight:      Height:        Intake/Output Summary (Last 24 hours) at 08/06/2020 1059 Last data filed at 08/06/2020 0346 Gross per 24 hour  Intake 2640.42 ml  Output 1850 ml  Net 790.42 ml   Filed Weights   08/04/20 2155  Weight: 81.7 kg    Examination:  General exam: Appears calm and comfortable, not in any acute distress. Respiratory system: Clear to auscultation. Respiratory effort normal. Cardiovascular system: S1 & S2 heard, RRR. No JVD, murmurs, rubs, gallops or clicks. No pedal edema. Gastrointestinal system: Abdomen is nondistended, soft and nontender. Normal bowel sounds heard. Central nervous system: Alert and oriented x 3. No focal neurological deficits. Extremities: Right shoulder tenderness + ,No edema, no cyanosis, no clubbing. Skin: No rashes, lesions or ulcers Psychiatry: Judgement and insight appear normal. Mood & affect appropriate.     Data Reviewed: I have personally reviewed following labs and imaging studies  CBC: Recent Labs  Lab 08/04/20 1639 08/05/20 0659 08/06/20 0133  WBC 12.8* 10.9* 9.7  NEUTROABS 9.2*  --   --   HGB 15.5 13.1 13.0  HCT 46.1 38.5* 38.5*  MCV 93.9 94.6 93.4  PLT 301 244 831   Basic Metabolic Panel: Recent Labs  Lab 08/04/20 1639 08/05/20 0659  NA 135 136  K 4.1 3.7  CL 97* 101  CO2 26 26  GLUCOSE 107* 93  BUN 15 11  CREATININE 1.04 0.85  CALCIUM 9.5 8.7*   GFR: Estimated Creatinine Clearance: 94.8 mL/min (by C-G formula based on SCr of 0.85 mg/dL). Liver Function Tests: No results for input(s): AST, ALT, ALKPHOS, BILITOT, PROT, ALBUMIN in the last 168 hours. No results for input(s): LIPASE, AMYLASE in the last 168 hours. No results for input(s): AMMONIA in the last 168 hours. Coagulation Profile: No results for input(s): INR, PROTIME in the last 168 hours. Cardiac Enzymes: No results for input(s): CKTOTAL, CKMB, CKMBINDEX, TROPONINI in the last 168 hours. BNP (last  3 results) No results for input(s): PROBNP in the last 8760 hours. HbA1C: No results for input(s): HGBA1C in the last 72 hours. CBG: No results for input(s): GLUCAP in the last 168 hours. Lipid Profile: No results for input(s): CHOL, HDL, LDLCALC, TRIG, CHOLHDL, LDLDIRECT in the last 72 hours. Thyroid Function Tests: No results for input(s): TSH, T4TOTAL, FREET4, T3FREE, THYROIDAB in the last 72 hours. Anemia Panel: No results for input(s): VITAMINB12, FOLATE, FERRITIN, TIBC, IRON, RETICCTPCT in the last 72 hours. Sepsis Labs: No results for input(s): PROCALCITON, LATICACIDVEN in the last 168 hours.  Recent Results (from the past 240 hour(s))  SARS CORONAVIRUS 2 (TAT 6-24 HRS) Nasopharyngeal Nasopharyngeal Swab     Status: None   Collection Time: 08/04/20  7:15 PM   Specimen: Nasopharyngeal Swab  Result Value Ref Range Status   SARS Coronavirus 2 NEGATIVE NEGATIVE Final  Comment: (NOTE) SARS-CoV-2 target nucleic acids are NOT DETECTED.  The SARS-CoV-2 RNA is generally detectable in upper and lower respiratory specimens during the acute phase of infection. Negative results do not preclude SARS-CoV-2 infection, do not rule out co-infections with other pathogens, and should not be used as the sole basis for treatment or other patient management decisions. Negative results must be combined with clinical observations, patient history, and epidemiological information. The expected result is Negative.  Fact Sheet for Patients: SugarRoll.be  Fact Sheet for Healthcare Providers: https://www.woods-mathews.com/  This test is not yet approved or cleared by the [place] FDA and  has been authorized for detection and/or diagnosis of SARS-CoV-2 by FDA under an Emergency Use Authorization (EUA). This EUA will remain  in effect (meaning this test can be used) for the duration of the COVID-19 declaration under Se ction 564(b)(1) of the Act, 21  U.S.C. section 360bbb-3(b)(1), unless the authorization is terminated or revoked sooner.  Performed at [facility], [place] [address], [place], [place] [address]   Culture, blood (routine x 2)     Status: None (Preliminary result)   Collection Time: 08/04/20  7:50 PM   Specimen: BLOOD LEFT FOREARM  Result Value Ref Range Status   Specimen Description   Final    BLOOD LEFT FOREARM Performed at [facility], [place] [address], [place], [place] [address]    Special Requests   Final    BOTTLES DRAWN AEROBIC AND ANAEROBIC Blood Culture adequate volume Performed at [place] [address], [place], [place] [address]    Culture   Final    NO GROWTH 1 DAY Performed at [facility], [place] [address], [place], [place] [address]    Report Status PENDING  Incomplete  Culture, blood (routine x 2)     Status: None (Preliminary result)   Collection Time: 08/04/20  7:50 PM   Specimen: BLOOD  Result Value Ref Range Status   Specimen Description   Final    BLOOD RIGHT ANTECUBITAL Performed at [facility] [address] [place], [place] [address]    Special Requests   Final    BOTTLES DRAWN AEROBIC AND ANAEROBIC Blood Culture adequate volume Performed at [facility] [address], [place], [place] [address]    Culture   Final    NO GROWTH 1 DAY Performed at [facility], [place] [address], [place], [place] [address]    Report Status PENDING  Incomplete    Radiology Studies: CT Angio Chest PE W and/or Wo Contrast  Result Date: 08/04/2020 CLINICAL DATA:  PE suspected, high prob Intermittent left-sided chest pain with increasing shortness of breath. Shoulder surgery 10 days ago. EXAM: CT ANGIOGRAPHY CHEST WITH CONTRAST TECHNIQUE: Multidetector CT imaging of the chest was performed using the standard protocol during bolus administration of intravenous contrast. Multiplanar CT image reconstructions and MIPs were  obtained to evaluate the vascular anatomy. CONTRAST:  138mL OMNIPAQUE IOHEXOL 350 MG/ML SOLN COMPARISON:  Radiograph earlier today. Radiograph 07/20/2020 also reviewed. FINDINGS: Cardiovascular: There is lobulated soft tissue density at the left hilum, which causes mass effect on the distal left pulmonary artery. Adjacent to this lobulated soft tissue density are small intraluminal filling defects within the left upper lobe and lingular segmental pulmonary arterial branches. The left upper lobe pulmonary arteries are attenuated. There are no filling defects within the right pulmonary arteries. Dilated central pulmonary artery at 3.5 cm. Tortuous atherosclerotic thoracic aorta  without dissection. Heart size normal. No pericardial effusion. Mediastinum/Nodes: Multiple enlarged left hilar lymph nodes including a 15 mm node series 4, image 67. Enlarged AP window node measures 18 mm, series 4, image 51. There prominent prevascular nodes measuring up to 9 mm. Left lower paratracheal node measures 17 mm, series 4, image 54. There is enlarged subcarinal node at 18 mm. Borderline right hilar node at 11 mm, series 4, image 67. Tiny hiatal hernia. Lungs/Pleura: Findings of interstitial lung disease with basilar and peripheral predominant honeycombing. There is a an area of masslike opacity in the perifissural left upper lobe peripherally. Exact dimensions are difficult due to the ill-defined nature, however this measures at least 4.4 x 1.9 x 4 cm, measured on series 6, image 42 and series 7, image 64. Surrounding ground-glass and ill-defined opacity, as well as coursing along the left inter lobar fissure and projecting into the superior segment of the left lower lobe, series 6, image 37. There is a small left pleural effusion. No definite pleural thickening or enhancement. Minimal retained mucus in the trachea and central bronchi. Upper Abdomen: Nonspecific hypodensities in the liver, largest in the left lobe measuring 17 mm,  series 4, image 134. Water density lesion arising from the posterior right kidney measures 2.9 cm. There additional low-density lesions in the right kidney that are incompletely characterized. Mild left adrenal thickening. No dominant adrenal nodule. Musculoskeletal: Right humeral head arthroplasty. Endplate irregularity with sclerosis and likely adjacent Schmorl's nodes at T12-L1. No discrete bone lesion. Review of the MIP images confirms the above findings. IMPRESSION: 1. Small intraluminal filling defects in the left central pulmonary arteries extending into the segmental branches, consistent with small pulmonary emboli. Some of these filling defects are contiguous with left hilar adenopathy and left hilar soft tissue density. 2. Masslike opacity in the periphery of the left upper lobe measures approximately 4.4 cm. Differential considerations include neoplasm or infection. 3. Mediastinal and left hilar adenopathy. 4. Small left pleural effusion. 5. Findings of interstitial lung disease with basilar and peripheral predominant fibrosis/honeycombing. 6. Nonspecific hypodensities in the liver, largest in the left lobe measuring 17 mm. 7. Right renal cyst as well as low-density lesions in the right kidney that are incompletely characterized. Aortic Atherosclerosis (ICD10-I70.0). Critical Value/emergent results were called by telephone at the time of interpretation on 08/04/2020 at 7:06 pm to provider Sherwood Gambler , who verbally acknowledged these results. Electronically Signed   By: Keith Rake M.D.   On: 08/04/2020 19:07   DG Chest Portable 1 View  Result Date: 08/04/2020 CLINICAL DATA:  Progressive shortness of breath the last 2 days. EXAM: PORTABLE CHEST 1 VIEW COMPARISON:  07/21/2015. FINDINGS: Heart size and mediastinal contours are unremarkable. No pleural effusion identified. Diffuse chronic appearing reticular interstitial opacities are noted throughout both lungs which is concerning for fibrotic lung  disease. Superimposed airspace opacity within the left upper lobe is concerning for pneumonia. This is increased when compared with previous study. Status post right shoulder arthroplasty. Chronic degenerative changes are noted involving the left shoulder. IMPRESSION: 1. Left upper lobe airspace opacity, increased when compared with previous study. This is worrisome for pneumonia. Followup PA and lateral chest X-ray is recommended in 3-4 weeks following trial of antibiotic therapy to ensure resolution and exclude underlying malignancy. 2. Chronic interstitial lung disease. Recommend follow-up imaging with nonemergent high-resolution CT of the chest following resolution of this acute episode for more definitive diagnosis. Electronically Signed   By: Kerby Moors M.D.   On: 08/04/2020  17:40   VAS [place] LOWER EXTREMITY VENOUS (DVT)  Result Date: 08/05/2020  Lower Venous DVT Study Patient Name:  MACKLEN WILHOITE  Date of Exam:   08/05/2020 Medical Rec #: [id number]          Accession #:    [id number] Date of Birth: 28-Jan-1951          Patient Gender: M Patient Age:   69Y Exam Location:  [facility] Procedure:      VAS [place] LOWER EXTREMITY VENOUS (DVT) Referring Phys: [id number] MICHAEL B WERT --------------------------------------------------------------------------------  Indications: Pulmonary embolism.  Risk Factors: None identified. Comparison Study: No prior studies. Performing Technologist: Oliver Hum RVT  Examination Guidelines: A complete evaluation includes B-mode imaging, spectral Doppler, color Doppler, and power Doppler as needed of all accessible portions of each vessel. Bilateral testing is considered an integral part of a complete examination. Limited examinations for reoccurring indications may be performed as noted. The reflux portion of the exam is performed with the patient in reverse Trendelenburg.  +---------+---------------+---------+-----------+----------+--------------+ RIGHT     CompressibilityPhasicitySpontaneityPropertiesThrombus Aging +---------+---------------+---------+-----------+----------+--------------+ CFV      Full           Yes      Yes                                 +---------+---------------+---------+-----------+----------+--------------+ SFJ      Full                                                        +---------+---------------+---------+-----------+----------+--------------+ FV Prox  Full                                                        +---------+---------------+---------+-----------+----------+--------------+ FV Mid   Full                                                        +---------+---------------+---------+-----------+----------+--------------+ FV DistalFull                                                        +---------+---------------+---------+-----------+----------+--------------+ PFV      Full                                                        +---------+---------------+---------+-----------+----------+--------------+ POP      Full           Yes      Yes                                 +---------+---------------+---------+-----------+----------+--------------+  PTV      Full                                                        +---------+---------------+---------+-----------+----------+--------------+ PERO     Full                                                        +---------+---------------+---------+-----------+----------+--------------+   +---------+---------------+---------+-----------+----------+--------------+ LEFT     CompressibilityPhasicitySpontaneityPropertiesThrombus Aging +---------+---------------+---------+-----------+----------+--------------+ CFV      Full           Yes      Yes                                 +---------+---------------+---------+-----------+----------+--------------+ SFJ      Full                                                         +---------+---------------+---------+-----------+----------+--------------+ FV Prox  Full                                                        +---------+---------------+---------+-----------+----------+--------------+ FV Mid   Full                                                        +---------+---------------+---------+-----------+----------+--------------+ FV DistalFull                                                        +---------+---------------+---------+-----------+----------+--------------+ PFV      Full                                                        +---------+---------------+---------+-----------+----------+--------------+ POP      Full           Yes      Yes                                 +---------+---------------+---------+-----------+----------+--------------+ PTV      Full                                                        +---------+---------------+---------+-----------+----------+--------------+  PERO     Full                                                        +---------+---------------+---------+-----------+----------+--------------+     Summary: RIGHT: - There is no evidence of deep vein thrombosis in the lower extremity.  - No cystic structure found in the popliteal fossa.  LEFT: - There is no evidence of deep vein thrombosis in the lower extremity.  - No cystic structure found in the popliteal fossa.  *See table(s) above for measurements and observations. Electronically signed by Ruta Hinds MD on 08/05/2020 at 4:25:34 PM.    Final     Scheduled Meds:  aspirin EC  81 mg Oral Daily   atorvastatin  10 mg Oral Daily   nortriptyline  75 mg Oral QHS   pantoprazole  40 mg Oral BID AC   Continuous Infusions:  azithromycin 500 mg (08/05/20 2010)   cefTRIAXone (ROCEPHIN)  IV 1 g (08/06/20 1015)   heparin 1,700 Units/hr (08/06/20 0627)   lactated ringers 100 mL/hr at 08/05/20 1146     LOS: 2 days     Time spent: 25 mins    Jannelly Bergren, MD [facility]   If 7PM-7AM, please contact night-coverage

## 2020-08-06 NOTE — Progress Notes (Signed)
ANTICOAGULATION CONSULT NOTE - Follow Up Consult  Pharmacy Consult for heparin Indication: pulmonary embolus  No Known Allergies  Patient Measurements: Height: 6\' 2"  (188 cm) Weight: 81.7 kg (180 lb 3.2 oz) IBW/kg (Calculated) : 82.2 Heparin Dosing Weight: n/a. Use TBW = 82 kg  Vital Signs: Temp: 98.2 F (36.8 C) (07/03 2021) Temp Source: Oral (07/03 2021) BP: 137/85 (07/03 2021) Pulse Rate: 79 (07/03 2021)  Labs: Recent Labs    08/04/20 1639 08/04/20 1839 08/04/20 2235 08/05/20 0659 08/05/20 1613 08/06/20 0133  HGB 15.5  --   --  13.1  --  13.0  HCT 46.1  --   --  38.5*  --  38.5*  PLT 301  --   --  244  --  240  HEPARINUNFRC  --   --   --  0.22* 0.24* 0.41  CREATININE 1.04  --   --  0.85  --   --   TROPONINIHS 95* 225* 173*  --   --   --      Estimated Creatinine Clearance: 94.8 mL/min (by C-G formula based on SCr of 0.85 mg/dL).   Medications: No anticoagulants PTA  Assessment: Pt is a 59 yoM presenting with SOB, chest pain. PMH significant for recent orthopaedic shoulder surgery on 07/26/20. CTA revealed small PE, pharmacy consulted to dose heparin.   Today, 08/06/20 HL = 0.41 is therapeutic on heparin infusion of 1700 units/hr CBC: WNL No complications of therapy noted  Goal of Therapy:  Heparin level 0.3-0.7 units/ml Monitor platelets by anticoagulation protocol: Yes   Plan:  Continue IV heparin gtt @ 1700 units/hr Recheck 6 hour HL to confirm therapeutic dose HL, CBC daily while on  heparin  Monitor for signs of bleeding  Othella Slappey, Toribio Harbour, PharmD 08/06/2020,2:47 AM

## 2020-08-06 NOTE — Progress Notes (Addendum)
Pt complained of flutter, weakness in the middle of his chest below sternum, light headedness,  "just a scary feeling"  he said. The episode started when he returned from the bathroom after having a BM. BP- 142/84, HR-70. RR-18, temp - 97.7 EKG was done and pt's chart.  MD notified no new orders. Will continue to monitor.

## 2020-08-06 NOTE — Progress Notes (Signed)
Echocardiogram 2D Echocardiogram has been performed.  Oneal Deputy Morio Widen RDCS 08/06/2020, 11:52 AM

## 2020-08-06 NOTE — Plan of Care (Signed)
  Problem: Clinical Measurements: Goal: Ability to maintain clinical measurements within normal limits will improve Outcome: Progressing Goal: Respiratory complications will improve Outcome: Progressing   Problem: Coping: Goal: Level of anxiety will decrease Outcome: Progressing   Problem: Pain Managment: Goal: General experience of comfort will improve Outcome: Progressing

## 2020-08-07 ENCOUNTER — Inpatient Hospital Stay (HOSPITAL_COMMUNITY): Payer: Medicare Other

## 2020-08-07 LAB — CBC
HCT: 38 % — ABNORMAL LOW (ref 39.0–52.0)
Hemoglobin: 12.9 g/dL — ABNORMAL LOW (ref 13.0–17.0)
MCH: 31.7 pg (ref 26.0–34.0)
MCHC: 33.9 g/dL (ref 30.0–36.0)
MCV: 93.4 fL (ref 80.0–100.0)
Platelets: 263 10*3/uL (ref 150–400)
RBC: 4.07 MIL/uL — ABNORMAL LOW (ref 4.22–5.81)
RDW: 12.4 % (ref 11.5–15.5)
WBC: 10.2 10*3/uL (ref 4.0–10.5)
nRBC: 0 % (ref 0.0–0.2)

## 2020-08-07 LAB — HEPARIN LEVEL (UNFRACTIONATED)
Heparin Unfractionated: 0.26 IU/mL — ABNORMAL LOW (ref 0.30–0.70)
Heparin Unfractionated: 0.44 IU/mL (ref 0.30–0.70)

## 2020-08-07 LAB — RHEUMATOID FACTOR: Rheumatoid fact SerPl-aCnc: 13.1 IU/mL (ref <14.0)

## 2020-08-07 LAB — ANA W/REFLEX IF POSITIVE: Anti Nuclear Antibody (ANA): NEGATIVE

## 2020-08-07 MED ORDER — APIXABAN 5 MG PO TABS
10.0000 mg | ORAL_TABLET | Freq: Two times a day (BID) | ORAL | Status: DC
  Administered 2020-08-07 – 2020-08-08 (×3): 10 mg via ORAL
  Filled 2020-08-07 (×3): qty 2

## 2020-08-07 MED ORDER — APIXABAN 5 MG PO TABS: 5.0000 mg | ORAL_TABLET | Freq: Two times a day (BID) | ORAL | Status: DC

## 2020-08-07 NOTE — Plan of Care (Signed)
  Problem: Activity: Goal: Risk for activity intolerance will decrease Outcome: Completed/Met   Problem: Nutrition: Goal: Adequate nutrition will be maintained Outcome: Completed/Met   Problem: Pain Managment: Goal: General experience of comfort will improve Outcome: Completed/Met   

## 2020-08-07 NOTE — Progress Notes (Addendum)
NAME:  Paul Roth, MRN:  [id number], DOB:  [birth year], LOS: 3 ADMISSION DATE:  08/04/2020, CONSULTATION DATE:  7/3  REFERRING MD:  Tonita Phoenix, CHIEF COMPLAINT:  dyspnea/ pulmonary infiltrates   History of Present Illness:  53 yowm remote smoker eval by Dr Shearon Stalls 07/17/20 to clear for  R shoulder surgery with >?  Evolving chronic ILD ( x 5 y hx) admitted 7/2 with acute on chronic sob with following hx  Quit smoking in 1990 with a 35 pack year smoking history  70 y.o. male with medical history significant for HLD, interstitial lung disease, OA who presents for evaluation of shortness of breath associated with left-sided chest pain and diaphoresis.  Patient had right shoulder surgery 2 weeks ago which he had been at home recovering from.  He had been using hydrocodone for pain which he had been weaning himself off.  2 days PTA he did some light yard work of picking blueberries and using his left arm.  The next day he noticed some left-sided chest pain that was not severe and did not alter his activities.  Night prior he noticed some shortness of breath that was worse with exertion and ambulation along with the chest pain.  This morning when he woke up the left-sided chest pain and shortness of breath became severe and was associated with diaphoresis.   Taking a deep breath or moving his left arm worsened the pain.  Shortness of breath improved with  sit down and rest.  He does report a chronic cough and postnasal drip which has not changed.  He has not had any fevers chills, swelling of his legs, recent travel.  He reported when he was 70 year old had a collapsed lung and states this pain felt similar to that.  When the pain was severe he did take hydrocodone at home which helped relieve the pain.  Pain at worst was a 9 out of 10  .  States he has never had blood clots or pulmonary embolisms. He has a history of smoking but quit in 1990.  He has a history of smoking marijuana but quit this over 10 years  ago.  He denies alcohol. Lives with his wife. No known recent sick contacts   ED Course: Mr. Cuff has been hemodynamically stable since arrival in the emergency room.  He is currently on room air and has oxygen saturation of 97 to 100%.  His EKG showed no acute ischemia.  Troponin was elevated at 95.  WBC is 12,800, hemoglobin 15.5, hematocrit 46.1, platelets 301,000.  Sodium is 135, potassium 4.1, chloride 97, bicarb 26, creatinine 1.04, BUN 15, glucose 107.  Chest x-ray showed an opacity in the left upper lobe which was larger than the previous chest x-ray.  CT angiography was obtained which showed left-sided small pulmonary emboli.  Patient also has a left upper lobe mass which favors neoplasm but could also have component of pneumonia.  Patient was placed on heparin infusion in the emergency room for therapeutic anticoagulation.  Patient was given antibiotics with Rocephin and azithromycin.  Hospitalist service was asked to admit patient for further management.  I have called and discussed case with pulmonology who will see patient in the morning in consultation     Significant Hospital Events: Including procedures, antibiotic start and stop dates in addition to other pertinent events   CTa chest 7/2: 1. Small intraluminal filling defects in the left central pulmonary arteries extending into the segmental branches, consistent with small pulmonary emboli. Some of  these filling defects are contiguous with left hilar adenopathy and left hilar soft tissue density.  Mass like opacity in the periphery of the left upper lobe measure approximately 4.4 cm. Differential considerations include neoplasm or infection. Mediastinal and left hilar adenopathy(largest 48mm). Small left pleural effusion. Findings of interstitial lung disease with basilar and periphera predominant fibrosis/honeycombing. Nonspecific hypodensities in the liver, largest in the left lobemeasuring 17 mm.  08/06/2020 Echo A.ortic  dilatation noted. There is moderate dilatation of the aortic root, measuring 47 mm. There is moderate dilatation of the ascending aorta, measuring 47 mm. 2 Left ventricular ejection fraction, by estimation, is 55 to 60%. The left ventricle has normal function. The left ventricle has no regional wall motion abnormalities. Left ventricular diastolic parameters are consistent with Grade I diastolic dysfunction (impaired relaxation). 3. Right ventricular systolic function is normal. The right ventricular size is normal. Tricuspid regurgitation signal is inadequate for assessing PA pressure. 4. The mitral valve is normal in structure. No evidence of mitral valve regurgitation. No evidence of mitral stenosis. 5. The aortic valve is tricuspid. Aortic valve regurgitation is not visualized. No aortic stenosis is present. 6. The inferior vena cava is normal in size with greater than 50% respiratory variability, suggesting right atrial pressure of 3 mmHg.   OP Pulmonary Visit 07/17/2020   ILD REVIEW OF SYSTEMS No exposure to asbestos, silica or other organic allergens    -pt worked see below No birds or chickens No water damage or mold exposure in home No hot tub at home No arthralgias or myalgias Denies skin rash or lesions Denies nail changes or finger splitting + Raynaud's Denies dry eyes  +dry mouth Denies dysphagia +GERD +poor sleep PPD negative and no h/o exposure No h/o chemo/XRT/amiodarone/macrodantin/MTX Originally from [place], has lived in [place] since 1974   Scheduled Meds:  apixaban  10 mg Oral BID   Followed by   Derrill Memo ON 08/14/2020] apixaban  5 mg Oral BID   atorvastatin  10 mg Oral Daily   nortriptyline  75 mg Oral QHS   pantoprazole  40 mg Oral BID AC   Continuous Infusions:  azithromycin 500 mg (08/06/20 2144)   cefTRIAXone (ROCEPHIN)  IV 1 g (08/07/20 0937)   lactated ringers 100 mL/hr at 08/07/20 1034   PRN Meds:.acetaminophen **OR** acetaminophen,  HYDROcodone-acetaminophen, ipratropium-albuterol, ondansetron **OR** ondansetron (ZOFRAN) IV, polyethylene glycol    Interim History / Subjective:  Awake and alert, minimal cough, very scan secretions. Some blood tinged secretions from nose . Started on Eliquis 7/5.  States breathing is better and pain he was experiencing has resolved.  HGB 12.9, WBC 10.2, platelets are 263 Net negative 972 ( 2900 cc urine out last 24 hours) Sed rate 61 Rheumatoid Factor >> 13.1>> negative ANA pending Do not see that HSP was colected  Objective   Blood pressure 123/84, pulse 71, temperature 98.3 F (36.8 C), temperature source Oral, resp. rate (!) 22, height 6\' 2"  (1.88 m), weight 81.7 kg, SpO2 96 %.        Intake/Output Summary (Last 24 hours) at 08/07/2020 1421 Last data filed at 08/07/2020 1342 Gross per 24 hour  Intake 2349.63 ml  Output 3750 ml  Net -1400.37 ml   Filed Weights   08/04/20 2155  Weight: 81.7 kg   Vital signs reviewed  08/04/2020  -  Sats 99% on RA Examination: General: pleasant elderly wm nad, on RA while at rest. Tmax  98.8  HENT: NCAT, No LAD, No JVD Lungs: bilateral Bilateral  chest excursion, L >R basilar crackles. Slt bronchial changes, diminished per bases.  Cardiovascular: RRR No RMG, SR per tele Abdomen: soft, benign Extremities: mod clubbing , warm, brisk refill Neuro: A&O x 3, MAE x 4      Assessment & Plan:  1)  acute on chronic sob  in pt with underlying ILD (? X 5y by hx) with acute findings c/w pe  plus ? Pna vs infarction and strongly doubt malignancy clinically or radiographically >>> continue anti-coagulation  and empiric rx for cap >>> max rx for gerd (see#2)  >>> Plan for follow up Chest CT ( Short term 6-8 weeks) vs bronch  with biopsy ( Would not stop anti-coag for bronch for several weeks as new diagnosis PE) >>> Will defer to Dr. Shearon Stalls ( patient's primary pulmonologist) for decision re: above - Saturation goals of > 90% - Will need ambulatory  saturation walk prior to discharge home to ensure he is not hypoxic with exertional activity. - Incentive spirometry Q 1 while awake   2) ILD / pt with clubbing so UIP in ddx and plan was for HRCT but will hold off for now with that w/u until sort out the acute issues above.  >>L send labs for HSP/ collagen vasc dz >>> in meantime Use of PPI is associated with improved survival time and with decreased radiologic fibrosis per King's study published in AJRCCM vol 184 p1390.  Dec 2011 and also may have other beneficial effects as per the latest review in [place] vol 193 V7858 Jun 20016.  This may not always be cause and effect but he has h/o gerd so rec start  rx ppi / diet/ lifestyle modification and f/u with serial walking sats and lung volumes for now to put more points on the curve / establish firm baseline before considering additional measures.   3) 02 dep resp failure > as above, ambulatory saturation test before discharge.  Sat goal > 90%   Will need close OP follow up with Dr. Shearon Stalls for imaging  in 6-8 weeks vs bronch once he has been therapeutic on [facility] post PE diagnosis for several weeks.  Labs   CBC: Recent Labs  Lab 08/04/20 1639 08/05/20 0659 08/06/20 0133 08/07/20 0331  WBC 12.8* 10.9* 9.7 10.2  NEUTROABS 9.2*  --   --   --   HGB 15.5 13.1 13.0 12.9*  HCT 46.1 38.5* 38.5* 38.0*  MCV 93.9 94.6 93.4 93.4  PLT 301 244 240 850    Basic Metabolic Panel: Recent Labs  Lab 08/04/20 1639 08/05/20 0659  NA 135 136  K 4.1 3.7  CL 97* 101  CO2 26 26  GLUCOSE 107* 93  BUN 15 11  CREATININE 1.04 0.85  CALCIUM 9.5 8.7*   GFR: Estimated Creatinine Clearance: 94.8 mL/min (by C-G formula based on SCr of 0.85 mg/dL). Recent Labs  Lab 08/04/20 1639 08/05/20 0659 08/06/20 0133 08/07/20 0331  WBC 12.8* 10.9* 9.7 10.2    Liver Function Tests: No results for input(s): AST, ALT, ALKPHOS, BILITOT, PROT, ALBUMIN in the last 168 hours. No results for input(s): LIPASE, AMYLASE  in the last 168 hours. No results for input(s): AMMONIA in the last 168 hours.  ABG No results found for: PHART, PCO2ART, PO2ART, HCO3, TCO2, ACIDBASEDEF, O2SAT   Coagulation Profile: No results for input(s): INR, PROTIME in the last 168 hours.  Cardiac Enzymes: No results for input(s): CKTOTAL, CKMB, CKMBINDEX, TROPONINI in the last 168 hours.  HbA1C: No results found for:  HGBA1C  CBG: No results for input(s): GLUCAP in the last 168 hours.    No Known Allergies   Home Medications  Prior to Admission medications   Medication Sig Start Date End Date Taking? Authorizing Provider  ascorbic acid (VITAMIN C) 100 MG tablet Take 100 mg by mouth daily.   Yes [provider]  atorvastatin (LIPITOR) 10 MG tablet Take 10 mg by mouth daily. 05/15/20  Yes [provider]  HYDROcodone-acetaminophen (NORCO/VICODIN) 5-325 MG tablet Take 1 tablet by mouth every 6 (six) hours as needed for moderate pain or severe pain. 08/01/20  Yes [provider]  MAGNESIUM CITRATE PO Take 1 Scoop by mouth daily.   Yes [provider]  nortriptyline (PAMELOR) 75 MG capsule Take 75 mg by mouth at bedtime. 06/23/20  Yes [provider]  ondansetron (ZOFRAN) 4 MG tablet Take 4 mg by mouth every 8 (eight) hours as needed for vomiting or nausea. 08/01/20  Yes [provider]  prednisoLONE acetate (PRED FORTE) 1 % ophthalmic suspension Place 1 drop into the right eye daily. 06/15/20  Yes [provider]  RESVERATROL PO Take 1 capsule by mouth daily.   Yes [provider]  tiZANidine (ZANAFLEX) 2 MG tablet Take 1 tablet (2 mg total) by mouth every 8 (eight) hours as needed (as needed for muscle pain). 07/26/20  Yes [place], Museum/gallery conservator, PA-C  VITAMIN D, CHOLECALCIFEROL, PO Take 1 tablet by mouth daily.   Yes [provider]       Magdalen Spatz, AGACNP-BC Pulmonary [facility] [place] 08/07/2020 2:22 PM   After 7:00  pm call Elink  [phone] >> Hospital Use only   Critical care attending attestation note:  Patient seen and examined and relevant ancillary tests reviewed.  I agree with the assessment and plan of care as outlined by Eric Form NP.    Acute PE. CT with what appears UIP. Lingular density, suspect infarction. Can not rule out neoplasm. Would be prudent to repeat imaging in coming weeks. New start [facility] for acute PE so would not pursue diagnostic interventions at this point.  Synopsis of assessment and plan:  Acute PE: RV function normal. Presumed provoked after recent should surgery.  --AC --On room air  ILD: follows with Dr. Shearon Stalls.  Lingular opacity: Follow up imaging, workup per Dr. Shearon Stalls. --f/u appt 08/14/20  PCCM will sign off.   CRITICAL CARE N/a  Lanier Clam, MD See Amion for contact info  08/07/2020, 7:38 PM

## 2020-08-07 NOTE — Progress Notes (Addendum)
ANTICOAGULATION CONSULT NOTE - Follow Up Consult  Pharmacy Consult for heparin Indication: pulmonary embolus  No Known Allergies  Patient Measurements: Height: 6\' 2"  (188 cm) Weight: 81.7 kg (180 lb 3.2 oz) IBW/kg (Calculated) : 82.2 Heparin Dosing Weight: TBW  Vital Signs: Temp: 98.3 F (36.8 C) (07/05 0649) Temp Source: Oral (07/05 0649) BP: 123/84 (07/05 0649) Pulse Rate: 71 (07/05 0649)  Labs: Recent Labs    08/04/20 1639 08/04/20 1839 08/04/20 2235 08/05/20 0659 08/05/20 1613 08/06/20 0133 08/06/20 0721 08/07/20 0331  HGB 15.5  --   --  13.1  --  13.0  --  12.9*  HCT 46.1  --   --  38.5*  --  38.5*  --  38.0*  PLT 301  --   --  244  --  240  --  263  HEPARINUNFRC  --   --   --  0.22*   < > 0.41 0.40 0.26*  CREATININE 1.04  --   --  0.85  --   --   --   --   TROPONINIHS 95* 225* 173*  --   --   --   --   --    < > = values in this interval not displayed.     Estimated Creatinine Clearance: 94.8 mL/min (by C-G formula based on SCr of 0.85 mg/dL).   Medications: No anticoagulants PTA  azithromycin 500 mg (08/06/20 2144)   cefTRIAXone (ROCEPHIN)  IV 1 g (08/07/20 3094)   heparin 1,850 Units/hr (08/07/20 0647)   lactated ringers 100 mL/hr at 08/06/20 2257     Assessment: Pt is a 78 yoM presenting with SOB, chest pain. PMH significant for recent orthopaedic shoulder surgery on 07/26/20. CTA revealed small PE, pharmacy consulted to dose heparin.   Today, 08/07/20 HL = 0.44, therapeutic on heparin infusion 1850 units/hr CBC: Hgb 12.9 stable, PLTC WNL No complications of therapy noted  Goal of Therapy:  Heparin level 0.3-0.7 units/ml Monitor platelets by anticoagulation protocol: Yes   Plan:  Continue IV heparin 1850 units/hr HL, CBC daily while on heparin  Monitor for signs of bleeding Follow up transition to oral anticoagulation.   Gretta Arab PharmD, BCPS Clinical Pharmacist [facility] main pharmacy [phone] 08/07/2020 10:02 AM   Addendum:   08/07/2020, 1:00 PM Pharmacy is now consulted to transition from heparin to apixaban. CBC stable, SCr 0.85 Per Dr. Dwyane Dee, d/c Aspirin Apixaban 10mg  PO BID x7 days, followed by apixaban 5 mg PO BID.  Pharmacy to provide education prior to discharge.  Gretta Arab PharmD, BCPS Clinical Pharmacist [facility] main pharmacy [phone] 08/07/2020 1:11 PM

## 2020-08-07 NOTE — Discharge Instructions (Addendum)
Information on my medicine - ELIQUIS (apixaban)  Why was Eliquis prescribed for you? Eliquis was prescribed to treat blood clots that may have been found in the veins of your legs (deep vein thrombosis) or in your lungs (pulmonary embolism) and to reduce the risk of them occurring again.  What do You need to know about Eliquis ? The starting dose is 10 mg (two 5 mg tablets) taken TWICE daily for the FIRST SEVEN (7) DAYS, then on (enter date)  08/14/20  the dose is reduced to ONE 5 mg tablet taken TWICE daily.  Eliquis may be taken with or without food.   Try to take the dose about the same time in the morning and in the evening. If you have difficulty swallowing the tablet whole please discuss with your pharmacist how to take the medication safely.  Take Eliquis exactly as prescribed and DO NOT stop taking Eliquis without talking to the doctor who prescribed the medication.  Stopping may increase your risk of developing a new blood clot.  Refill your prescription before you run out.  After discharge, you should have regular check-up appointments with your healthcare provider that is prescribing your Eliquis.    What do you do if you miss a dose? If a dose of ELIQUIS is not taken at the scheduled time, take it as soon as possible on the same day and twice-daily administration should be resumed. The dose should not be doubled to make up for a missed dose.  Important Safety Information A possible side effect of Eliquis is bleeding. You should call your healthcare provider right away if you experience any of the following: Bleeding from an injury or your nose that does not stop. Unusual colored urine (red or dark brown) or unusual colored stools (red or black). Unusual bruising for unknown reasons. A serious fall or if you hit your head (even if there is no bleeding).  Some medicines may interact with Eliquis and might increase your risk of bleeding or clotting while on Eliquis. To  help avoid this, consult your healthcare provider or pharmacist prior to using any new prescription or non-prescription medications, including herbals, vitamins, non-steroidal anti-inflammatory drugs (NSAIDs) and supplements.  This website has more information on Eliquis (apixaban): http://www.eliquis.com/eliquis/home

## 2020-08-07 NOTE — Progress Notes (Signed)
ANTICOAGULATION CONSULT NOTE - Follow Up Consult  Pharmacy Consult for heparin Indication: pulmonary embolus  No Known Allergies  Patient Measurements: Height: 6\' 2"  (188 cm) Weight: 81.7 kg (180 lb 3.2 oz) IBW/kg (Calculated) : 82.2 Heparin Dosing Weight: TBW  Vital Signs: Temp: 98.8 F (37.1 C) (07/04 2109) Temp Source: Oral (07/04 2109) BP: 132/80 (07/04 2109) Pulse Rate: 72 (07/04 2109)  Labs: Recent Labs    08/04/20 1639 08/04/20 1839 08/04/20 2235 08/05/20 0659 08/05/20 1613 08/06/20 0133 08/06/20 0721 08/07/20 0331  HGB 15.5  --   --  13.1  --  13.0  --  12.9*  HCT 46.1  --   --  38.5*  --  38.5*  --  38.0*  PLT 301  --   --  244  --  240  --  263  HEPARINUNFRC  --   --   --  0.22*   < > 0.41 0.40 0.26*  CREATININE 1.04  --   --  0.85  --   --   --   --   TROPONINIHS 95* 225* 173*  --   --   --   --   --    < > = values in this interval not displayed.     Estimated Creatinine Clearance: 94.8 mL/min (by C-G formula based on SCr of 0.85 mg/dL).   Medications: No anticoagulants PTA  azithromycin 500 mg (08/06/20 2144)   cefTRIAXone (ROCEPHIN)  IV 1 g (08/06/20 1015)   heparin 1,700 Units/hr (08/06/20 2142)   lactated ringers 100 mL/hr at 08/06/20 2257     Assessment: Pt is a 36 yoM presenting with SOB, chest pain. PMH significant for recent orthopaedic shoulder surgery on 07/26/20. CTA revealed small PE, pharmacy consulted to dose heparin.   Today, 08/07/20 HL = 0.26, subtherapeutic on heparin infusion of 1700 units/hr CBC: Hgb 12.9 stable, PLTC WNL No complications of therapy noted  Goal of Therapy:  Heparin level 0.3-0.7 units/ml Monitor platelets by anticoagulation protocol: Yes   Plan:  Increase IV heparin to 1850 units/hr Check heparin level 6 hr after rate increase HL, CBC daily while on heparin  Monitor for signs of bleeding Follow up transition to oral anticoagulation.   Leone Haven, PharmD 08/07/2020 6:16 AM

## 2020-08-07 NOTE — Progress Notes (Signed)
PROGRESS NOTE    Paul Roth  TKP:[id number] DOB: Apr 26, 1950 DOA: 08/04/2020 PCP: Bernerd Limbo, MD   Brief Narrative:  This 70 years old male with PMH significant for hyperlipidemia, interstitial lung disease, osteoarthritis presented in the ED with complaints of shortness of breath associated with left-sided chest pain and diaphoresis.  Patient reports when he was 70 years old,  he had a collapsed lung and states this pain felt similar to that.  He is hemodynamically stable.  CTA chest was obtained which shows left sided small pulmonary emboli,  there is a left upper lobe mass which favors neoplasm but could also be a pneumonia.  Patient started on antibiotics (ceftriaxone and Zithromax for possible community-acquired pneumonia). Patient also started on IV heparin for pulmonary embolism,  pulmonology consulted , thinks this could be a pneumonia or GERD. Maximize GERD therapy, complete antibiotics for CAP.  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 CTA chest confirms small pulmonary embolism and lung mass. Continue heparin infusion, will transition to oral anticoagulation today. Continue supplemental oxygen if needed. Weaned down to room air. Venous duplex negative for DVT, Echo showed aortic dilatation.  LVEF 50 to 55%.  Lung mass: CT chest showed left upper lobe mass could be pneumonia versus lung mass. Pulmonology consulted, will determine need for bronchoscopy and lung biopsy. Pulmonology recommended maximize GERD therapy, complete antibiotics for CAP. Continue antibiotics ceftriaxone and Zithromax ( 5 Days). Repeat chest x-ray to see any improvement.  Non-STEMI: Patient presented with chest  pain, SOB, elevated troponin. This could be due to demand ischemia,  troponin is trending down. Continue aspirin and statins daily.  Interstitial lung disease: Continue inhalers.  Leukocytosis: This could be due to pneumonia Continue Zithromax and Rocephin.  DVT prophylaxis: Heparin gtt > Eliquis Code Status: (Full code) Family Communication: No family at bedside Disposition Plan:   Status is: Inpatient  Remains inpatient appropriate because:Inpatient level of care appropriate due to severity of illness  Dispo: The patient is from: Home              Anticipated d/c is to: Home              Patient currently is not medically stable to d/c.   Difficult to place patient No   Consultants:  Pulmonology  Procedures: CTA Chest Antimicrobials:   Anti-infectives (From admission, onward)    Start     Dose/Rate Route Frequency Ordered Stop   08/04/20 2030  cefTRIAXone (ROCEPHIN) 1 g in sodium chloride 0.9 % 100 mL IVPB        1 g 200 mL/hr over 30 Minutes Intravenous Every 24 hours 08/04/20 2024     08/04/20 2030  azithromycin (ZITHROMAX) 500 mg in sodium chloride 0.9 % 250 mL IVPB        500 mg 250 mL/hr over 60 Minutes Intravenous Every 24 hours 08/04/20 2024     08/04/20 1915  cefTRIAXone (ROCEPHIN) 1 g in sodium chloride 0.9 % 100 mL IVPB        1 g 200 mL/hr over 30 Minutes Intravenous  Once 08/04/20 1914 08/04/20 2104   08/04/20 1915  azithromycin (ZITHROMAX) 500 mg in sodium chloride 0.9 % 250 mL IVPB  500 mg 250 mL/hr over 60 Minutes Intravenous  Once 08/04/20 1914 08/04/20 2138        Subjective: Patient was seen and examined at bedside.  Overnight events noted.   Patient reports feeling much improved.  He reports right shoulder pain is controlled with pain medications. He denies chest pain, shortness of breath.  Objective: Vitals:   08/06/20 0442 08/06/20 1346 08/06/20 2109 08/07/20 0649  BP: 121/78 (!) 132/92 132/80 123/84  Pulse: 64 83 72 71  Resp:  20 18  (!) 22  Temp: 98.1 F (36.7 C) 97.8 F (36.6 C) 98.8 F (37.1 C) 98.3 F (36.8 C)  TempSrc: Oral  Oral Oral  SpO2:  99% 98% 96%  Weight:      Height:        Intake/Output Summary (Last 24 hours) at 08/07/2020 1227 Last data filed at 08/07/2020 1220 Gross per 24 hour  Intake 2599.98 ml  Output 3750 ml  Net -1150.02 ml    Filed Weights   08/04/20 2155  Weight: 81.7 kg    Examination:  General exam: Appears calm and comfortable, not in any acute distress. Respiratory system: Clear to auscultation. Respiratory effort normal. Cardiovascular system: S1 & S2 heard, RRR. No JVD, murmurs, rubs, gallops or clicks. No pedal edema. Gastrointestinal system: Abdomen is nondistended, soft and nontender. Normal bowel sounds heard. Central nervous system: Alert and oriented x 3. No focal neurological deficits. Extremities: Right shoulder tenderness +, No edema, no cyanosis, no clubbing. Skin: No rashes, lesions or ulcers Psychiatry: Judgement and insight appear normal. Mood & affect appropriate.     Data Reviewed: I have personally reviewed following labs and imaging studies  CBC: Recent Labs  Lab 08/04/20 1639 08/05/20 0659 08/06/20 0133 08/07/20 0331  WBC 12.8* 10.9* 9.7 10.2  NEUTROABS 9.2*  --   --   --   HGB 15.5 13.1 13.0 12.9*  HCT 46.1 38.5* 38.5* 38.0*  MCV 93.9 94.6 93.4 93.4  PLT 301 244 240 536    Basic Metabolic Panel: Recent Labs  Lab 08/04/20 1639 08/05/20 0659  NA 135 136  K 4.1 3.7  CL 97* 101  CO2 26 26  GLUCOSE 107* 93  BUN 15 11  CREATININE 1.04 0.85  CALCIUM 9.5 8.7*    GFR: Estimated Creatinine Clearance: 94.8 mL/min (by C-G formula based on SCr of 0.85 mg/dL). Liver Function Tests: No results for input(s): AST, ALT, ALKPHOS, BILITOT, PROT, ALBUMIN in the last 168 hours. No results for input(s): LIPASE, AMYLASE in the last 168 hours. No results for input(s): AMMONIA in the last 168 hours. Coagulation Profile: No results for  input(s): INR, PROTIME in the last 168 hours. Cardiac Enzymes: No results for input(s): CKTOTAL, CKMB, CKMBINDEX, TROPONINI in the last 168 hours. BNP (last 3 results) No results for input(s): PROBNP in the last 8760 hours. HbA1C: No results for input(s): HGBA1C in the last 72 hours. CBG: No results for input(s): GLUCAP in the last 168 hours. Lipid Profile: No results for input(s): CHOL, HDL, LDLCALC, TRIG, CHOLHDL, LDLDIRECT in the last 72 hours. Thyroid Function Tests: No results for input(s): TSH, T4TOTAL, FREET4, T3FREE, THYROIDAB in the last 72 hours. Anemia Panel: No results for input(s): VITAMINB12, FOLATE, FERRITIN, TIBC, IRON, RETICCTPCT in the last 72 hours. Sepsis Labs: No results for input(s): PROCALCITON, LATICACIDVEN in the last 168 hours.  Recent Results (from the past 240 hour(s))  SARS CORONAVIRUS 2 (TAT 6-24 HRS) Nasopharyngeal Nasopharyngeal Swab     Status: None  Collection Time: 08/04/20  7:15 PM   Specimen: Nasopharyngeal Swab  Result Value Ref Range Status   SARS Coronavirus 2 NEGATIVE NEGATIVE Final    Comment: (NOTE) SARS-CoV-2 target nucleic acids are NOT DETECTED.  The SARS-CoV-2 RNA is generally detectable in upper and lower respiratory specimens during the acute phase of infection. Negative results do not preclude SARS-CoV-2 infection, do not rule out co-infections with other pathogens, and should not be used as the sole basis for treatment or other patient management decisions. Negative results must be combined with clinical observations, patient history, and epidemiological information. The expected result is Negative.  Fact Sheet for Patients: SugarRoll.be  Fact Sheet for Healthcare Providers: https://www.woods-mathews.com/  This test is not yet approved or cleared by the [place] FDA and  has been authorized for detection and/or diagnosis of SARS-CoV-2 by FDA under an Emergency Use Authorization  (EUA). This EUA will remain  in effect (meaning this test can be used) for the duration of the COVID-19 declaration under Se ction 564(b)(1) of the Act, 21 U.S.C. section 360bbb-3(b)(1), unless the authorization is terminated or revoked sooner.  Performed at [facility], [place] [address] [place], [place] [address]   Culture, blood (routine x 2)     Status: None (Preliminary result)   Collection Time: 08/04/20  7:50 PM   Specimen: BLOOD LEFT FOREARM  Result Value Ref Range Status   Specimen Description   Final    BLOOD LEFT FOREARM Performed at [facility], [place] [address] [place], [place] [address]    Special Requests   Final    BOTTLES DRAWN AEROBIC AND ANAEROBIC Blood Culture adequate volume Performed at [facility] [address] [place], [place] [address]    Culture   Final    NO GROWTH 2 DAYS Performed at [facility] [address], [place], [place] [address]    Report Status PENDING  Incomplete  Culture, blood (routine x 2)     Status: None (Preliminary result)   Collection Time: 08/04/20  7:50 PM   Specimen: BLOOD  Result Value Ref Range Status   Specimen Description   Final    BLOOD RIGHT ANTECUBITAL Performed at [facility] [address], [place], [place] [address]    Special Requests   Final    BOTTLES DRAWN AEROBIC AND ANAEROBIC Blood Culture adequate volume Performed at [facility] [address] [place], [place] [address]    Culture   Final    NO GROWTH 2 DAYS Performed at [facility] [address], [place], [place] [address]    Report Status PENDING  Incomplete     Radiology Studies: ECHOCARDIOGRAM COMPLETE  Result Date: 08/06/2020    ECHOCARDIOGRAM REPORT   Patient Name:   Paul Roth Date of Exam: 08/06/2020 Medical Rec #:  [id number]         Height:       74.0 in Accession #:    [id number]        Weight:       180.2 lb Date of Birth:  [birth year]          BSA:          2.079 m Patient Age:    10 years          BP:           121/77 mmHg Patient Gender: M  HR:           74 bpm. Exam Location:  Inpatient Procedure: 2D Echo, Color Doppler and Cardiac Doppler Indications:    I26.02 Pulmonary embolus  History:        Patient has no prior history of Echocardiogram examinations.                 Risk Factors:Dyslipidemia.  Sonographer:    Raquel Sarna Senior RDCS Referring Phys: [place]  1. Aortic dilatation noted. There is moderate dilatation of the aortic root, measuring 47 mm. There is moderate dilatation of the ascending aorta, measuring 47 mm.  2. Left ventricular ejection fraction, by estimation, is 55 to 60%. The left ventricle has normal function. The left ventricle has no regional wall motion abnormalities. Left ventricular diastolic parameters are consistent with Grade I diastolic dysfunction (impaired relaxation).  3. Right ventricular systolic function is normal. The right ventricular size is normal. Tricuspid regurgitation signal is inadequate for assessing PA pressure.  4. The mitral valve is normal in structure. No evidence of mitral valve regurgitation. No evidence of mitral stenosis.  5. The aortic valve is tricuspid. Aortic valve regurgitation is not visualized. No aortic stenosis is present.  6. The inferior vena cava is normal in size with greater than 50% respiratory variability, suggesting right atrial pressure of 3 mmHg. Comparison(s): No prior Echocardiogram. Conclusion(s)/Recommendation(s): Consider secondary imaging for the aortic root and ascending aorta (CTA vs MRA) for further evaluation if clinically indicated. FINDINGS  Left Ventricle: Left ventricular ejection fraction, by estimation, is 55 to 60%. The left ventricle has normal function. The left ventricle has no regional wall motion abnormalities. The left ventricular internal cavity size was normal in size. There is  no left ventricular hypertrophy. Left  ventricular diastolic parameters are consistent with Grade I diastolic dysfunction (impaired relaxation). Right Ventricle: The right ventricular size is normal. No increase in right ventricular wall thickness. Right ventricular systolic function is normal. Tricuspid regurgitation signal is inadequate for assessing PA pressure. Left Atrium: Left atrial size was normal in size. Right Atrium: Right atrial size was normal in size. Pericardium: There is no evidence of pericardial effusion. Mitral Valve: The mitral valve is normal in structure. No evidence of mitral valve regurgitation. No evidence of mitral valve stenosis. Tricuspid Valve: The tricuspid valve is normal in structure. Tricuspid valve regurgitation is not demonstrated. No evidence of tricuspid stenosis. Aortic Valve: The aortic valve is tricuspid. Aortic valve regurgitation is not visualized. No aortic stenosis is present. Pulmonic Valve: The pulmonic valve was not well visualized. Pulmonic valve regurgitation is not visualized. No evidence of pulmonic stenosis. Aorta: Aortic dilatation noted. There is moderate dilatation of the aortic root, measuring 47 mm. There is moderate dilatation of the ascending aorta, measuring 47 mm. Venous: The inferior vena cava is normal in size with greater than 50% respiratory variability, suggesting right atrial pressure of 3 mmHg. IAS/Shunts: The atrial septum is grossly normal.  LEFT VENTRICLE PLAX 2D LVIDd:         4.40 cm  Diastology LVIDs:         3.00 cm  LV e' medial:    7.29 cm/s LV PW:         1.00 cm  LV E/e' medial:  7.8 LV IVS:        0.90 cm  LV e' lateral:   11.00 cm/s LVOT diam:     2.25 cm  LV E/e' lateral: 5.2 LV SV:  86 LV SV Index:   41 LVOT Area:     3.98 cm  RIGHT VENTRICLE RV S prime:     9.03 cm/s TAPSE (M-mode): 2.1 cm LEFT ATRIUM             Index       RIGHT ATRIUM           Index LA diam:        3.00 cm 1.44 cm/m  RA Area:     12.40 cm LA Vol (A2C):   40.3 ml 19.38 ml/m RA Volume:    25.40 ml  12.22 ml/m LA Vol (A4C):   29.5 ml 14.19 ml/m LA Biplane Vol: 36.5 ml 17.55 ml/m  AORTIC VALVE LVOT Vmax:   101.00 cm/s LVOT Vmean:  68.100 cm/s LVOT VTI:    0.217 m  AORTA Ao Root diam: 4.70 cm Ao Asc diam:  4.70 cm MITRAL VALVE MV Area (PHT): 2.70 cm    SHUNTS MV Decel Time: 281 msec    Systemic VTI:  0.22 m MV E velocity: 56.90 cm/s  Systemic Diam: 2.25 cm MV A velocity: 77.10 cm/s MV E/A ratio:  0.74 Rudean Haskell MD Electronically signed by Rudean Haskell MD Signature Date/Time: 08/06/2020/3:18:04 PM    Final    VAS [place] LOWER EXTREMITY VENOUS (DVT)  Result Date: 08/05/2020  Lower Venous DVT Study Patient Name:  Paul Roth  Date of Exam:   08/05/2020 Medical Rec #: [id number]          Accession #:    [id number] Date of Birth: 10-Jul-1950          Patient Gender: M Patient Age:   69Y Exam Location:  [facility] Procedure:      VAS [place] LOWER EXTREMITY VENOUS (DVT) Referring Phys: [id number] MICHAEL B WERT --------------------------------------------------------------------------------  Indications: Pulmonary embolism.  Risk Factors: None identified. Comparison Study: No prior studies. Performing Technologist: Oliver Hum RVT  Examination Guidelines: A complete evaluation includes B-mode imaging, spectral Doppler, color Doppler, and power Doppler as needed of all accessible portions of each vessel. Bilateral testing is considered an integral part of a complete examination. Limited examinations for reoccurring indications may be performed as noted. The reflux portion of the exam is performed with the patient in reverse Trendelenburg.  +---------+---------------+---------+-----------+----------+--------------+ RIGHT    CompressibilityPhasicitySpontaneityPropertiesThrombus Aging +---------+---------------+---------+-----------+----------+--------------+ CFV      Full           Yes      Yes                                  +---------+---------------+---------+-----------+----------+--------------+ SFJ      Full                                                        +---------+---------------+---------+-----------+----------+--------------+ FV Prox  Full                                                        +---------+---------------+---------+-----------+----------+--------------+ FV Mid   Full                                                        +---------+---------------+---------+-----------+----------+--------------+  FV DistalFull                                                        +---------+---------------+---------+-----------+----------+--------------+ PFV      Full                                                        +---------+---------------+---------+-----------+----------+--------------+ POP      Full           Yes      Yes                                 +---------+---------------+---------+-----------+----------+--------------+ PTV      Full                                                        +---------+---------------+---------+-----------+----------+--------------+ PERO     Full                                                        +---------+---------------+---------+-----------+----------+--------------+   +---------+---------------+---------+-----------+----------+--------------+ LEFT     CompressibilityPhasicitySpontaneityPropertiesThrombus Aging +---------+---------------+---------+-----------+----------+--------------+ CFV      Full           Yes      Yes                                 +---------+---------------+---------+-----------+----------+--------------+ SFJ      Full                                                        +---------+---------------+---------+-----------+----------+--------------+ FV Prox  Full                                                         +---------+---------------+---------+-----------+----------+--------------+ FV Mid   Full                                                        +---------+---------------+---------+-----------+----------+--------------+ FV DistalFull                                                        +---------+---------------+---------+-----------+----------+--------------+  PFV      Full                                                        +---------+---------------+---------+-----------+----------+--------------+ POP      Full           Yes      Yes                                 +---------+---------------+---------+-----------+----------+--------------+ PTV      Full                                                        +---------+---------------+---------+-----------+----------+--------------+ PERO     Full                                                        +---------+---------------+---------+-----------+----------+--------------+     Summary: RIGHT: - There is no evidence of deep vein thrombosis in the lower extremity.  - No cystic structure found in the popliteal fossa.  LEFT: - There is no evidence of deep vein thrombosis in the lower extremity.  - No cystic structure found in the popliteal fossa.  *See table(s) above for measurements and observations. Electronically signed by Ruta Hinds MD on 08/05/2020 at 4:25:34 PM.    Final     Scheduled Meds:  aspirin EC  81 mg Oral Daily   atorvastatin  10 mg Oral Daily   nortriptyline  75 mg Oral QHS   pantoprazole  40 mg Oral BID AC   Continuous Infusions:  azithromycin 500 mg (08/06/20 2144)   cefTRIAXone (ROCEPHIN)  IV 1 g (08/07/20 0937)   heparin 1,850 Units/hr (08/07/20 1220)   lactated ringers 100 mL/hr at 08/07/20 1034     LOS: 3 days    Time spent: 25 mins    Paul Nabozny, MD [facility]   If 7PM-7AM, please contact night-coverage

## 2020-08-08 ENCOUNTER — Other Ambulatory Visit: Payer: PRIVATE HEALTH INSURANCE

## 2020-08-08 LAB — CBC WITH DIFFERENTIAL/PLATELET
Abs Immature Granulocytes: 0.08 10*3/uL — ABNORMAL HIGH (ref 0.00–0.07)
Basophils Absolute: 0.1 10*3/uL (ref 0.0–0.1)
Basophils Relative: 1 %
Eosinophils Absolute: 0.4 10*3/uL (ref 0.0–0.5)
Eosinophils Relative: 4 %
HCT: 37.7 % — ABNORMAL LOW (ref 39.0–52.0)
Hemoglobin: 12.8 g/dL — ABNORMAL LOW (ref 13.0–17.0)
Immature Granulocytes: 1 %
Lymphocytes Relative: 25 %
Lymphs Abs: 2.6 10*3/uL (ref 0.7–4.0)
MCH: 31.4 pg (ref 26.0–34.0)
MCHC: 34 g/dL (ref 30.0–36.0)
MCV: 92.4 fL (ref 80.0–100.0)
Monocytes Absolute: 1 10*3/uL (ref 0.1–1.0)
Monocytes Relative: 9 %
Neutro Abs: 6.4 10*3/uL (ref 1.7–7.7)
Neutrophils Relative %: 60 %
Platelets: 289 10*3/uL (ref 150–400)
RBC: 4.08 MIL/uL — ABNORMAL LOW (ref 4.22–5.81)
RDW: 12.6 % (ref 11.5–15.5)
WBC: 10.6 10*3/uL — ABNORMAL HIGH (ref 4.0–10.5)
nRBC: 0 % (ref 0.0–0.2)

## 2020-08-08 LAB — BASIC METABOLIC PANEL
Anion gap: 10 (ref 5–15)
BUN: 11 mg/dL (ref 8–23)
CO2: 24 mmol/L (ref 22–32)
Calcium: 8.8 mg/dL — ABNORMAL LOW (ref 8.9–10.3)
Chloride: 103 mmol/L (ref 98–111)
Creatinine, Ser: 0.94 mg/dL (ref 0.61–1.24)
GFR, Estimated: >60 mL/min (ref >60)
Glucose, Bld: 96 mg/dL (ref 70–99)
Potassium: 3.9 mmol/L (ref 3.5–5.1)
Sodium: 137 mmol/L (ref 135–145)

## 2020-08-08 LAB — PHOSPHORUS: Phosphorus: 3.6 mg/dL (ref 2.5–4.6)

## 2020-08-08 LAB — MAGNESIUM: Magnesium: 2.1 mg/dL (ref 1.7–2.4)

## 2020-08-08 MED ORDER — PANTOPRAZOLE SODIUM 40 MG PO TBEC: 40.0000 mg | DELAYED_RELEASE_TABLET | Freq: Two times a day (BID) | ORAL | 2 refills | Status: AC

## 2020-08-08 MED ORDER — CEFDINIR 300 MG PO CAPS: 300.0000 mg | ORAL_CAPSULE | Freq: Two times a day (BID) | ORAL | 0 refills | Status: AC

## 2020-08-08 MED ORDER — APIXABAN 5 MG PO TABS: 5.0000 mg | ORAL_TABLET | Freq: Two times a day (BID) | ORAL | 5 refills | Status: AC

## 2020-08-08 MED ORDER — APIXABAN 5 MG PO TABS
10.0000 mg | ORAL_TABLET | Freq: Two times a day (BID) | ORAL | 0 refills | Status: DC
Start: 2020-08-08 — End: 2020-08-28

## 2020-08-08 MED ORDER — MUSCLE RUB 10-15 % EX CREA
1.0000 "application " | TOPICAL_CREAM | CUTANEOUS | Status: DC | PRN
  Filled 2020-08-08: qty 85

## 2020-08-08 NOTE — Care Management Important Message (Signed)
Important Message  Patient Details IM Letter given to the Patient. Name: Paul Roth MRN: [id number] Date of Birth: 04/24/1950   Medicare Important Message Given:  Yes     Kerin Salen 08/08/2020, 10:26 AM

## 2020-08-08 NOTE — Progress Notes (Signed)
SATURATION QUALIFICATIONS:   Patient Saturations on Room Air at Rest = 100%  Patient Saturations on Room Air while Ambulating = 98%

## 2020-08-08 NOTE — Progress Notes (Signed)
Discharge instructions given. Patient verbalized understanding and all questions were answered.  ?

## 2020-08-08 NOTE — Discharge Summary (Signed)
Physician Discharge Summary  Paul Roth BTD:[id number] DOB: 1951-01-16 DOA: 08/04/2020  PCP: Bernerd Limbo, MD  Admit date: 08/04/2020  Discharge date: 08/08/2020  Admitted From: Home.  Disposition:  Home.  Recommendations for Outpatient Follow-up:  Follow up with PCP in 1-2 weeks, Please obtain BMP/CBC in one week, Advised to follow-up with pulmonologist Dr. Shearon Stalls in 2 weeks. Advised to take Omnicef 300 mg twice daily for next 2 days to complete 7-day treatment for community-acquired pneumonia Advised to take Eliquis 10 mg twice a day for 6 days followed by 5 mg twice daily for next 6 to 8 months. Patient may need repeat CT chest in 3 to 4 weeks and might require bronchoscopy or lung biopsy for lung mass.  Home Health:None Equipment/Devices:None  Discharge Condition: Good CODE STATUS:Full code Diet recommendation: Heart Healthy   Brief [facility] Course: This 70 years old male with PMH significant for hyperlipidemia, interstitial lung disease, osteoarthritis presented in the ED with complaints of shortness of breath associated with left-sided chest pain and diaphoresis.  Patient reports when he was 70 years old,  he had a collapsed lung and states this pain felt similar to that.  He was hemodynamically stable. CTA chest was obtained which shows left sided small pulmonary emboli,  there is a left upper lobe mass which favors neoplasm but could also be a pneumonia.   Patient was admitted in the hospital for pulmonary embolism and possible pneumonia., He was started on  IV antibiotics (ceftriaxone and Zithromax for CAP). Patient was also started on IV heparin for pulmonary embolism,  Pulmonology consulted for lung mass to see if he needs lung biopsy, Pulmonology thinks this could be a pneumonia or GERD.  Pulmonologist recommended to maximize GERD therapy, complete antibiotics for CAP.  Since patient is having acute PE,  he needs to be adequately anticoagulated before having any  invasive procedures. Patient was initially hypoxic but he has weaned down successfully to room air.  Pulmonologist recommended outpatient follow-up in 3 to 4 weeks with pulmonologist,  at the time patient will have repeat CT scan and primary pulmonologist will decide whether patient needs bronchoscopy and lung biopsy at that point.  Patient was advised to continue pantoprazole 40 mg twice daily, Omnicef 300 mg twice daily for 2 more days to complete 7-day treatment for community-acquired pneumonia and follow-up outpatient with Dr. Shearon Stalls. He feels better and want to be discharged.  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 CTA chest confirms small pulmonary embolism and lung mass. Started on heparin infusion, transitioned to oral Eliquis on 08/07/20 Weaned down to room air. Venous duplex negative for DVT, Echo showed aortic dilatation.  LVEF 50 to 55%. Patient will need Eliquis for 6 months to a year.   Lung mass: CT chest showed left upper lobe mass could be pneumonia versus lung mass. Pulmonology consulted, will determine need for bronchoscopy and lung biopsy. Pulmonology recommended maximize GERD therapy, complete antibiotics for CAP. Pulmonologist recommended close outpatient follow-up with pulmonology in 3 to 4 weeks. At the time patient will have repeat CT scan chest to determine the need for bronchoscopy and lung biopsy.  Non-STEMI: Patient presented with chest pain, SOB, elevated troponin. This could be due to demand ischemia,  troponin is trending down. Continue aspirin and statins daily.    Interstitial lung disease: Continue inhalers.   Leukocytosis: Resolved This could be due to pneumonia Continue Zithromax and Rocephin.  Discharge Instructions  Discharge Instructions     Call MD for:  difficulty breathing, headache or visual disturbances   Complete by: As directed    Call MD for:  persistant dizziness or light-headedness   Complete by: As directed    Call MD for:  persistant nausea and vomiting   Complete by: As directed    Diet - low sodium heart healthy   Complete by: As directed    Diet Carb Modified   Complete by: As directed    Discharge instructions   Complete by: As directed    Advised to follow-up with pulmonologist Dr. Shearon Stalls in 2 weeks. Advised to take Omnicef 300 mg twice daily for next 2 days to complete 7-day treatment for community-acquired pneumonia Advised to take Eliquis 10 mg twice a day for 6 days followed by 5 mg twice daily for next 6 to 8 months.   Discharge wound care:   Complete by: As directed    Follow-up orthopedics for shoulder surgery.   Increase activity slowly   Complete by: As directed       Allergies as of 08/08/2020   No Known Allergies      Medication List     TAKE these medications    apixaban 5 MG Tabs tablet Commonly known as: ELIQUIS Take 2 tablets (10 mg total) by mouth 2 (two) times daily for 6 days.   apixaban 5 MG Tabs tablet Commonly known as: ELIQUIS Take 1 tablet (5 mg total) by mouth 2 (two) times daily. Start taking on: August 14, 2020   ascorbic acid 100 MG tablet Commonly known as: VITAMIN C Take 100 mg by mouth daily.   atorvastatin 10 MG tablet Commonly known as: LIPITOR Take 10 mg by mouth daily.   cefdinir 300 MG capsule Commonly known as: OMNICEF Take 1 capsule (300 mg total) by mouth 2 (two) times daily for 2 days.   HYDROcodone-acetaminophen 5-325 MG tablet Commonly known as: NORCO/VICODIN Take 1 tablet by mouth every 6 (six) hours as needed for moderate pain or severe pain.    MAGNESIUM CITRATE PO Take 1 Scoop by mouth daily.   nortriptyline 75 MG capsule Commonly known as: PAMELOR Take 75 mg by mouth at bedtime.   ondansetron 4 MG tablet Commonly known as: ZOFRAN Take 4 mg by mouth every 8 (eight) hours as needed for vomiting or nausea.   pantoprazole 40 MG tablet Commonly known as: PROTONIX Take 1 tablet (40 mg total) by mouth 2 (two) times daily before a meal.   prednisoLONE acetate 1 % ophthalmic suspension Commonly known as: PRED FORTE Place 1 drop into the right eye daily.   RESVERATROL PO Take 1 capsule by mouth daily.   tiZANidine 2 MG tablet Commonly known as: ZANAFLEX Take 1 tablet (2 mg total) by mouth every 8 (eight) hours as needed (as needed for muscle pain).   VITAMIN D (CHOLECALCIFEROL) PO Take 1 tablet by mouth daily.               Discharge Care Instructions  (From admission, onward)           Start     Ordered   08/08/20 0000  Discharge wound care:  Comments: Follow-up orthopedics for shoulder surgery.   08/08/20 1347            Follow-up Information     Bernerd Limbo, MD Follow up in 1 week(s).   Specialty: Family Medicine Contact information: [place] [address] [phone]         [name], MD Follow up in 2 week(s).   Specialty: Pulmonary Disease Contact information: [place] [address] [phone]                No Known Allergies  Consultations: Pulmonology   Procedures/Studies: DG Chest 2 View  Result Date: 07/22/2020 CLINICAL DATA:  69 year old male with preoperative chest radiograph for right shoulder arthroplasty. EXAM: CHEST - 2 VIEW COMPARISON:  None. FINDINGS: There is diffuse chronic interstitial coarsening and bronchitic changes. Faint left upper lobe density may be chronic although developing infiltrate is not entirely excluded clinical correlation is recommended. Focal area of somewhat  nodular density along the lateral right pleural surface noted. This may represent an area of atelectasis or scarring. A nodule is not entirely excluded. CT may provide better evaluation on a nonemergent/outpatient basis. There is no pleural effusion pneumothorax. The cardiac silhouette is within limits. No acute osseous pathology. IMPRESSION: 1. Chronic changes versus less likely developing infiltrate in the left upper lobe. 2. Focal area of somewhat nodular density along the lateral right pleural surface. Electronically Signed   By: Anner Crete M.D.   On: 07/22/2020 01:04   CT Angio Chest PE W and/or Wo Contrast  Result Date: 08/04/2020 CLINICAL DATA:  PE suspected, high prob Intermittent left-sided chest pain with increasing shortness of breath. Shoulder surgery 10 days ago. EXAM: CT ANGIOGRAPHY CHEST WITH CONTRAST TECHNIQUE: Multidetector CT imaging of the chest was performed using the standard protocol during bolus administration of intravenous contrast. Multiplanar CT image reconstructions and MIPs were obtained to evaluate the vascular anatomy. CONTRAST:  11mL OMNIPAQUE IOHEXOL 350 MG/ML SOLN COMPARISON:  Radiograph earlier today. Radiograph 07/20/2020 also reviewed. FINDINGS: Cardiovascular: There is lobulated soft tissue density at the left hilum, which causes mass effect on the distal left pulmonary artery. Adjacent to this lobulated soft tissue density are small intraluminal filling defects within the left upper lobe and lingular segmental pulmonary arterial branches. The left upper lobe pulmonary arteries are attenuated. There are no filling defects within the right pulmonary arteries. Dilated central pulmonary artery at 3.5 cm. Tortuous atherosclerotic thoracic aorta without dissection. Heart size normal. No pericardial effusion. Mediastinum/Nodes: Multiple enlarged left hilar lymph nodes including a 15 mm node series 4, image 67. Enlarged AP window node measures 18 mm, series 4, image 51. There  prominent prevascular nodes measuring up to 9 mm. Left lower paratracheal node measures 17 mm, series 4, image 54. There is enlarged subcarinal node at 18 mm. Borderline right hilar node at 11 mm, series 4, image 67. Tiny hiatal hernia. Lungs/Pleura: Findings of interstitial lung disease with basilar and peripheral predominant honeycombing. There is a an area of masslike opacity in the perifissural left upper lobe peripherally. Exact dimensions are difficult due to the ill-defined nature, however this measures at least 4.4 x 1.9 x 4 cm, measured on series 6, image 42 and series 7, image 64. Surrounding ground-glass and ill-defined opacity, as well as coursing along the left inter lobar fissure and projecting into the superior segment of the left lower lobe, series 6, image 37. There is a small left pleural effusion. No definite  pleural thickening or enhancement. Minimal retained mucus in the trachea and central bronchi. Upper Abdomen: Nonspecific hypodensities in the liver, largest in the left lobe measuring 17 mm, series 4, image 134. Water density lesion arising from the posterior right kidney measures 2.9 cm. There additional low-density lesions in the right kidney that are incompletely characterized. Mild left adrenal thickening. No dominant adrenal nodule. Musculoskeletal: Right humeral head arthroplasty. Endplate irregularity with sclerosis and likely adjacent Schmorl's nodes at T12-L1. No discrete bone lesion. Review of the MIP images confirms the above findings. IMPRESSION: 1. Small intraluminal filling defects in the left central pulmonary arteries extending into the segmental branches, consistent with small pulmonary emboli. Some of these filling defects are contiguous with left hilar adenopathy and left hilar soft tissue density. 2. Masslike opacity in the periphery of the left upper lobe measures approximately 4.4 cm. Differential considerations include neoplasm or infection. 3. Mediastinal and left  hilar adenopathy. 4. Small left pleural effusion. 5. Findings of interstitial lung disease with basilar and peripheral predominant fibrosis/honeycombing. 6. Nonspecific hypodensities in the liver, largest in the left lobe measuring 17 mm. 7. Right renal cyst as well as low-density lesions in the right kidney that are incompletely characterized. Aortic Atherosclerosis (ICD10-I70.0). Critical Value/emergent results were called by telephone at the time of interpretation on 08/04/2020 at 7:06 pm to provider Sherwood Gambler , who verbally acknowledged these results. Electronically Signed   By: Keith Rake M.D.   On: 08/04/2020 19:07   DG CHEST PORT 1 VIEW  Result Date: 08/07/2020 CLINICAL DATA:  Evaluate for pneumonia EXAM: PORTABLE CHEST 1 VIEW COMPARISON:  08/04/2020 FINDINGS: Normal heart size. Diffuse interstitial pattern to the lungs likely represents underlying pulmonary fibrosis. Superimposed consolidation in the left upper lung is unchanged since prior study, suggesting focal pneumonia. Mass lesion is another consideration. See prior CT report 08/04/2020 small left pleural effusion. No pneumothorax. Postoperative changes in the right shoulder. IMPRESSION: No change since previous study. Diffuse interstitial lung disease with superimposed mass or consolidation in the left upper lung. Electronically Signed   By: Lucienne Capers M.D.   On: 08/07/2020 19:15   DG Chest Portable 1 View  Result Date: 08/04/2020 CLINICAL DATA:  Progressive shortness of breath the last 2 days. EXAM: PORTABLE CHEST 1 VIEW COMPARISON:  07/21/2015. FINDINGS: Heart size and mediastinal contours are unremarkable. No pleural effusion identified. Diffuse chronic appearing reticular interstitial opacities are noted throughout both lungs which is concerning for fibrotic lung disease. Superimposed airspace opacity within the left upper lobe is concerning for pneumonia. This is increased when compared with previous study. Status post right  shoulder arthroplasty. Chronic degenerative changes are noted involving the left shoulder. IMPRESSION: 1. Left upper lobe airspace opacity, increased when compared with previous study. This is worrisome for pneumonia. Followup PA and lateral chest X-ray is recommended in 3-4 weeks following trial of antibiotic therapy to ensure resolution and exclude underlying malignancy. 2. Chronic interstitial lung disease. Recommend follow-up imaging with nonemergent high-resolution CT of the chest following resolution of this acute episode for more definitive diagnosis. Electronically Signed   By: Kerby Moors M.D.   On: 08/04/2020 17:40   DG Shoulder Right Port  Result Date: 07/26/2020 CLINICAL DATA:  Status post right shoulder arthroplasty. EXAM: PORTABLE RIGHT SHOULDER COMPARISON:  None. FINDINGS: Humeral head arthroplasty in expected alignment. No periprosthetic lucency or fracture. Acromioclavicular joint is congruent. Recent postsurgical change includes air and edema in the joint space and soft tissues. IMPRESSION: Humeral head arthroplasty in expected alignment.  No immediate postoperative complication. Electronically Signed   By: Keith Rake M.D.   On: 07/26/2020 15:06   ECHOCARDIOGRAM COMPLETE  Result Date: 08/06/2020    ECHOCARDIOGRAM REPORT   Patient Name:   Paul Roth Date of Exam: 08/06/2020 Medical Rec #:  [id number]         Height:       74.0 in Accession #:    [id number]        Weight:       180.2 lb Date of Birth:  25-Jul-1950         BSA:          2.079 m Patient Age:    64 years          BP:           121/77 mmHg Patient Gender: M                 HR:           74 bpm. Exam Location:  Inpatient Procedure: 2D Echo, Color Doppler and Cardiac Doppler Indications:    I26.02 Pulmonary embolus  History:        Patient has no prior history of Echocardiogram examinations.                 Risk Factors:Dyslipidemia.  Sonographer:    Raquel Sarna Senior RDCS Referring Phys: [place]  1.  Aortic dilatation noted. There is moderate dilatation of the aortic root, measuring 47 mm. There is moderate dilatation of the ascending aorta, measuring 47 mm.  2. Left ventricular ejection fraction, by estimation, is 55 to 60%. The left ventricle has normal function. The left ventricle has no regional wall motion abnormalities. Left ventricular diastolic parameters are consistent with Grade I diastolic dysfunction (impaired relaxation).  3. Right ventricular systolic function is normal. The right ventricular size is normal. Tricuspid regurgitation signal is inadequate for assessing PA pressure.  4. The mitral valve is normal in structure. No evidence of mitral valve regurgitation. No evidence of mitral stenosis.  5. The aortic valve is tricuspid. Aortic valve regurgitation is not visualized. No aortic stenosis is present.  6. The inferior vena cava is normal in size with greater than 50% respiratory variability, suggesting right atrial pressure of 3 mmHg. Comparison(s): No prior Echocardiogram. Conclusion(s)/Recommendation(s): Consider secondary imaging for the aortic root and ascending aorta (CTA vs MRA) for further evaluation if clinically indicated. FINDINGS  Left Ventricle: Left ventricular ejection fraction, by estimation, is 55 to 60%. The left ventricle has normal function. The left ventricle has no regional wall motion abnormalities. The left ventricular internal cavity size was normal in size. There is  no left ventricular hypertrophy. Left ventricular diastolic parameters are consistent with Grade I diastolic dysfunction (impaired relaxation). Right Ventricle: The right ventricular size is normal. No increase in right ventricular wall thickness. Right ventricular systolic function is normal. Tricuspid regurgitation signal is inadequate for assessing PA pressure. Left Atrium: Left atrial size was normal in size. Right Atrium: Right atrial size was normal in size. Pericardium: There is no evidence of  pericardial effusion. Mitral Valve: The mitral valve is normal in structure. No evidence of mitral valve regurgitation. No evidence of mitral valve stenosis. Tricuspid Valve: The tricuspid valve is normal in structure. Tricuspid valve regurgitation is not demonstrated. No evidence of tricuspid stenosis. Aortic Valve: The aortic valve is tricuspid. Aortic valve regurgitation is not visualized. No aortic stenosis is present. Pulmonic Valve: The pulmonic valve was not  well visualized. Pulmonic valve regurgitation is not visualized. No evidence of pulmonic stenosis. Aorta: Aortic dilatation noted. There is moderate dilatation of the aortic root, measuring 47 mm. There is moderate dilatation of the ascending aorta, measuring 47 mm. Venous: The inferior vena cava is normal in size with greater than 50% respiratory variability, suggesting right atrial pressure of 3 mmHg. IAS/Shunts: The atrial septum is grossly normal.  LEFT VENTRICLE PLAX 2D LVIDd:         4.40 cm  Diastology LVIDs:         3.00 cm  LV e' medial:    7.29 cm/s LV PW:         1.00 cm  LV E/e' medial:  7.8 LV IVS:        0.90 cm  LV e' lateral:   11.00 cm/s LVOT diam:     2.25 cm  LV E/e' lateral: 5.2 LV SV:         86 LV SV Index:   41 LVOT Area:     3.98 cm  RIGHT VENTRICLE RV S prime:     9.03 cm/s TAPSE (M-mode): 2.1 cm LEFT ATRIUM             Index       RIGHT ATRIUM           Index LA diam:        3.00 cm 1.44 cm/m  RA Area:     12.40 cm LA Vol (A2C):   40.3 ml 19.38 ml/m RA Volume:   25.40 ml  12.22 ml/m LA Vol (A4C):   29.5 ml 14.19 ml/m LA Biplane Vol: 36.5 ml 17.55 ml/m  AORTIC VALVE LVOT Vmax:   101.00 cm/s LVOT Vmean:  68.100 cm/s LVOT VTI:    0.217 m  AORTA Ao Root diam: 4.70 cm Ao Asc diam:  4.70 cm MITRAL VALVE MV Area (PHT): 2.70 cm    SHUNTS MV Decel Time: 281 msec    Systemic VTI:  0.22 m MV E velocity: 56.90 cm/s  Systemic Diam: 2.25 cm MV A velocity: 77.10 cm/s MV E/A ratio:  0.74 Rudean Haskell MD Electronically signed by  Rudean Haskell MD Signature Date/Time: 08/06/2020/3:18:04 PM    Final    VAS [place] LOWER EXTREMITY VENOUS (DVT)  Result Date: 08/05/2020  Lower Venous DVT Study Patient Name:  Paul Roth  Date of Exam:   08/05/2020 Medical Rec #: [id number]          Accession #:    [id number] Date of Birth: [birth year]          Patient Gender: M Patient Age:   69Y Exam Location:  [facility] Procedure:      VAS [place] LOWER EXTREMITY VENOUS (DVT) Referring Phys: [id number] MICHAEL B WERT --------------------------------------------------------------------------------  Indications: Pulmonary embolism.  Risk Factors: None identified. Comparison Study: No prior studies. Performing Technologist: Oliver Hum RVT  Examination Guidelines: A complete evaluation includes B-mode imaging, spectral Doppler, color Doppler, and power Doppler as needed of all accessible portions of each vessel. Bilateral testing is considered an integral part of a complete examination. Limited examinations for reoccurring indications may be performed as noted. The reflux portion of the exam is performed with the patient in reverse Trendelenburg.  +---------+---------------+---------+-----------+----------+--------------+ RIGHT    CompressibilityPhasicitySpontaneityPropertiesThrombus Aging +---------+---------------+---------+-----------+----------+--------------+ CFV      Full           Yes      Yes                                 +---------+---------------+---------+-----------+----------+--------------+  SFJ      Full                                                        +---------+---------------+---------+-----------+----------+--------------+ FV Prox  Full                                                        +---------+---------------+---------+-----------+----------+--------------+ FV Mid   Full                                                         +---------+---------------+---------+-----------+----------+--------------+ FV DistalFull                                                        +---------+---------------+---------+-----------+----------+--------------+ PFV      Full                                                        +---------+---------------+---------+-----------+----------+--------------+ POP      Full           Yes      Yes                                 +---------+---------------+---------+-----------+----------+--------------+ PTV      Full                                                        +---------+---------------+---------+-----------+----------+--------------+ PERO     Full                                                        +---------+---------------+---------+-----------+----------+--------------+   +---------+---------------+---------+-----------+----------+--------------+ LEFT     CompressibilityPhasicitySpontaneityPropertiesThrombus Aging +---------+---------------+---------+-----------+----------+--------------+ CFV      Full           Yes      Yes                                 +---------+---------------+---------+-----------+----------+--------------+ SFJ      Full                                                        +---------+---------------+---------+-----------+----------+--------------+  FV Prox  Full                                                        +---------+---------------+---------+-----------+----------+--------------+ FV Mid   Full                                                        +---------+---------------+---------+-----------+----------+--------------+ FV DistalFull                                                        +---------+---------------+---------+-----------+----------+--------------+ PFV      Full                                                         +---------+---------------+---------+-----------+----------+--------------+ POP      Full           Yes      Yes                                 +---------+---------------+---------+-----------+----------+--------------+ PTV      Full                                                        +---------+---------------+---------+-----------+----------+--------------+ PERO     Full                                                        +---------+---------------+---------+-----------+----------+--------------+     Summary: RIGHT: - There is no evidence of deep vein thrombosis in the lower extremity.  - No cystic structure found in the popliteal fossa.  LEFT: - There is no evidence of deep vein thrombosis in the lower extremity.  - No cystic structure found in the popliteal fossa.  *See table(s) above for measurements and observations. Electronically signed by Ruta Hinds MD on 08/05/2020 at 4:25:34 PM.    Final       Subjective: Patient was seen and examined at bedside.  Overnight events noted.  Patient reports feeling better,  patient has ambulated without oxygen and oxygen saturation is maintained.  Patient is being discharged home  Discharge Exam: Vitals:   08/08/20 0402 08/08/20 1218  BP:  (!) 137/93  Pulse:  74  Resp:  18  Temp: 98.3 F (36.8 C) 98.2 F (36.8 C)  SpO2:  93%   Vitals:   08/07/20 2132 08/08/20 0400 08/08/20 0402 08/08/20 1218  BP: 135/89 128/88  (!) 137/93  Pulse: 72 75  74  Resp: (!) 21   18  Temp: 98.2 F (36.8 C) 98.3 F (36.8 C) 98.3 F (36.8 C) 98.2 F (36.8 C)  TempSrc: Oral Oral Oral Oral  SpO2: 95%   93%  Weight:      Height:        General: Pt is alert, awake, not in acute distress Cardiovascular: RRR, S1/S2 +, no rubs, no gallops Respiratory: CTA bilaterally, no wheezing, no rhonchi Abdominal: Soft, NT, ND, bowel sounds + Extremities: no edema, no cyanosis    The results of significant diagnostics from this hospitalization  (including imaging, microbiology, ancillary and laboratory) are listed below for reference.     Microbiology: Recent Results (from the past 240 hour(s))  SARS CORONAVIRUS 2 (TAT 6-24 HRS) Nasopharyngeal Nasopharyngeal Swab     Status: None   Collection Time: 08/04/20  7:15 PM   Specimen: Nasopharyngeal Swab  Result Value Ref Range Status   SARS Coronavirus 2 NEGATIVE NEGATIVE Final    Comment: (NOTE) SARS-CoV-2 target nucleic acids are NOT DETECTED.  The SARS-CoV-2 RNA is generally detectable in upper and lower respiratory specimens during the acute phase of infection. Negative results do not preclude SARS-CoV-2 infection, do not rule out co-infections with other pathogens, and should not be used as the sole basis for treatment or other patient management decisions. Negative results must be combined with clinical observations, patient history, and epidemiological information. The expected result is Negative.  Fact Sheet for Patients: SugarRoll.be  Fact Sheet for Healthcare Providers: https://www.woods-mathews.com/  This test is not yet approved or cleared by the [place] FDA and  has been authorized for detection and/or diagnosis of SARS-CoV-2 by FDA under an Emergency Use Authorization (EUA). This EUA will remain  in effect (meaning this test can be used) for the duration of the COVID-19 declaration under Se ction 564(b)(1) of the Act, 21 U.S.C. section 360bbb-3(b)(1), unless the authorization is terminated or revoked sooner.  Performed at [facility], [place] [address], [place], [place] [address]   Culture, blood (routine x 2)     Status: None (Preliminary result)   Collection Time: 08/04/20  7:50 PM   Specimen: BLOOD LEFT FOREARM  Result Value Ref Range Status   Specimen Description   Final    BLOOD LEFT FOREARM Performed at [facility], [place] [address], [place], [place] [address]    Special Requests   Final     BOTTLES DRAWN AEROBIC AND ANAEROBIC Blood Culture adequate volume Performed at [facility] [address], [place], [place] [address]    Culture   Final    NO GROWTH 3 DAYS Performed at [facility], [place] [address], [place], [place] [address]    Report Status PENDING  Incomplete  Culture, blood (routine x 2)     Status: None (Preliminary result)   Collection Time: 08/04/20  7:50 PM   Specimen: BLOOD  Result Value Ref Range Status   Specimen Description   Final    BLOOD RIGHT ANTECUBITAL Performed at [facility] [address] [place], [place] [address]    Special Requests   Final    BOTTLES DRAWN AEROBIC AND ANAEROBIC Blood Culture adequate volume Performed at [facility] [address], [place], [place] [address]    Culture   Final    NO GROWTH 3 DAYS Performed at [facility], [place] [address], [place], [place] [address]  Report Status PENDING  Incomplete     Labs: BNP (last 3 results) No results for input(s): BNP in the last 8760 hours. Basic Metabolic Panel: Recent Labs  Lab 08/04/20 1639 08/05/20 0659 08/08/20 0318  NA 135 136 137  K 4.1 3.7 3.9  CL 97* 101 103  CO2 26 26 24   GLUCOSE 107* 93 96  BUN 15 11 11   CREATININE 1.04 0.85 0.94  CALCIUM 9.5 8.7* 8.8*  MG  --   --  2.1  PHOS  --   --  3.6   Liver Function Tests: No results for input(s): AST, ALT, ALKPHOS, BILITOT, PROT, ALBUMIN in the last 168 hours. No results for input(s): LIPASE, AMYLASE in the last 168 hours. No results for input(s): AMMONIA in the last 168 hours. CBC: Recent Labs  Lab 08/04/20 1639 08/05/20 0659 08/06/20 0133 08/07/20 0331 08/08/20 0318  WBC 12.8* 10.9* 9.7 10.2 10.6*  NEUTROABS 9.2*  --   --   --  6.4  HGB 15.5 13.1 13.0 12.9* 12.8*  HCT 46.1 38.5* 38.5* 38.0* 37.7*  MCV 93.9 94.6 93.4 93.4 92.4  PLT 301 244 240 263 289   Cardiac Enzymes: No results for input(s): CKTOTAL, CKMB, CKMBINDEX,  TROPONINI in the last 168 hours. BNP: Invalid input(s): POCBNP CBG: No results for input(s): GLUCAP in the last 168 hours. D-Dimer No results for input(s): DDIMER in the last 72 hours. Hgb A1c No results for input(s): HGBA1C in the last 72 hours. Lipid Profile No results for input(s): CHOL, HDL, LDLCALC, TRIG, CHOLHDL, LDLDIRECT in the last 72 hours. Thyroid function studies No results for input(s): TSH, T4TOTAL, T3FREE, THYROIDAB in the last 72 hours.  Invalid input(s): FREET3 Anemia work up No results for input(s): VITAMINB12, FOLATE, FERRITIN, TIBC, IRON, RETICCTPCT in the last 72 hours. Urinalysis No results found for: COLORURINE, APPEARANCEUR, [place], [place], [place], [place], [place], [place], PROTEINUR, UROBILINOGEN, NITRITE, LEUKOCYTESUR Sepsis Labs Invalid input(s): PROCALCITONIN,  WBC,  LACTICIDVEN Microbiology Recent Results (from the past 240 hour(s))  SARS CORONAVIRUS 2 (TAT 6-24 HRS) Nasopharyngeal Nasopharyngeal Swab     Status: None   Collection Time: 08/04/20  7:15 PM   Specimen: Nasopharyngeal Swab  Result Value Ref Range Status   SARS Coronavirus 2 NEGATIVE NEGATIVE Final    Comment: (NOTE) SARS-CoV-2 target nucleic acids are NOT DETECTED.  The SARS-CoV-2 RNA is generally detectable in upper and lower respiratory specimens during the acute phase of infection. Negative results do not preclude SARS-CoV-2 infection, do not rule out co-infections with other pathogens, and should not be used as the sole basis for treatment or other patient management decisions. Negative results must be combined with clinical observations, patient history, and epidemiological information. The expected result is Negative.  Fact Sheet for Patients: SugarRoll.be  Fact Sheet for Healthcare Providers: https://www.woods-mathews.com/  This test is not yet approved or cleared by the [place] FDA and  has been authorized for  detection and/or diagnosis of SARS-CoV-2 by FDA under an Emergency Use Authorization (EUA). This EUA will remain  in effect (meaning this test can be used) for the duration of the COVID-19 declaration under Se ction 564(b)(1) of the Act, 21 U.S.C. section 360bbb-3(b)(1), unless the authorization is terminated or revoked sooner.  Performed at [facility], [place] [address], [place], [place] [address]   Culture, blood (routine x 2)     Status: None (Preliminary result)   Collection Time: 08/04/20  7:50 PM   Specimen: BLOOD LEFT FOREARM  Result Value Ref  Range Status   Specimen Description   Final    BLOOD LEFT FOREARM Performed at [facility], [place] [address], [place], [place] [address]    Special Requests   Final    BOTTLES DRAWN AEROBIC AND ANAEROBIC Blood Culture adequate volume Performed at [facility] [address], [place], [place] [address]    Culture   Final    NO GROWTH 3 DAYS Performed at [facility], [place] [address] [place], [place] [address]    Report Status PENDING  Incomplete  Culture, blood (routine x 2)     Status: None (Preliminary result)   Collection Time: 08/04/20  7:50 PM   Specimen: BLOOD  Result Value Ref Range Status   Specimen Description   Final    BLOOD RIGHT ANTECUBITAL Performed at [facility] [address], [place], [place] [address]    Special Requests   Final    BOTTLES DRAWN AEROBIC AND ANAEROBIC Blood Culture adequate volume Performed at [facility] [address] [place], [place] [address]    Culture   Final    NO GROWTH 3 DAYS Performed at [facility], [place] [address], [place], [place] [address]    Report Status PENDING  Incomplete     Time coordinating discharge: Over 30 minutes  SIGNED:   Shawna Clamp, MD  [facility] 08/08/2020, 4:40 PM Pager   If 7PM-7AM, please contact night-coverage [email or web address]

## 2020-08-10 ENCOUNTER — Other Ambulatory Visit (HOSPITAL_COMMUNITY): Payer: PRIVATE HEALTH INSURANCE

## 2020-08-10 LAB — CULTURE, BLOOD (ROUTINE X 2)
Culture: NO GROWTH
Culture: NO GROWTH
Special Requests: ADEQUATE
Special Requests: ADEQUATE

## 2020-08-14 ENCOUNTER — Ambulatory Visit: Payer: PRIVATE HEALTH INSURANCE | Admitting: Internal Medicine

## 2020-08-28 ENCOUNTER — Encounter: Payer: Self-pay | Admitting: Primary Care

## 2020-08-28 ENCOUNTER — Other Ambulatory Visit: Payer: Self-pay

## 2020-08-28 ENCOUNTER — Telehealth: Payer: Self-pay | Admitting: Primary Care

## 2020-08-28 ENCOUNTER — Ambulatory Visit (INDEPENDENT_AMBULATORY_CARE_PROVIDER_SITE_OTHER): Payer: Medicare Other | Admitting: Primary Care

## 2020-08-28 VITALS — BP 120/80 | HR 91 | Ht 74.0 in | Wt 176.2 lb

## 2020-08-28 DIAGNOSIS — R918 Other nonspecific abnormal finding of lung field: Secondary | ICD-10-CM

## 2020-08-28 DIAGNOSIS — I2699 Other pulmonary embolism without acute cor pulmonale: Secondary | ICD-10-CM

## 2020-08-28 DIAGNOSIS — R0609 Other forms of dyspnea: Secondary | ICD-10-CM

## 2020-08-28 DIAGNOSIS — R0600 Dyspnea, unspecified: Secondary | ICD-10-CM | POA: Diagnosis not present

## 2020-08-28 DIAGNOSIS — J849 Interstitial pulmonary disease, unspecified: Secondary | ICD-10-CM

## 2020-08-28 DIAGNOSIS — D491 Neoplasm of unspecified behavior of respiratory system: Secondary | ICD-10-CM

## 2020-08-28 NOTE — Telephone Encounter (Signed)
I saw patient today, can we get him scheduled for 6MWT prior to his next visit in mid-September. Dx ILD

## 2020-08-28 NOTE — Patient Instructions (Addendum)
Orders: ILD labs today HRCT in 6 weeks (already ordered, we will contact you to scheduled) Please bring ILD packet back to your next visit   Follow-up PFT in September and OV with Dr. Shearon Stalls (only)

## 2020-08-28 NOTE — Telephone Encounter (Signed)
Yes he needs HRCT in 6-8 weeks re: ILD and fu CAP

## 2020-08-28 NOTE — Assessment & Plan Note (Addendum)
-   Hx marijuana use/ + family hx lung disease.  Recommend pushing out PFTs and HRCT 6 weeks to allow time for him to recover from recent pneumonia. He also needs to complete ILD packet and have a 6MWT prior to his next visit.

## 2020-08-28 NOTE — Assessment & Plan Note (Addendum)
-   Provoked PE, needs anticoagulation for 6-8 months

## 2020-08-28 NOTE — Assessment & Plan Note (Signed)
-   Acute findings consistent with PE plus questionable pneumonia vs infarction. Treated with course of  omnicef, we will repeat imaging in 6-8 weeks. If area of concern has not resolved or improved, he will need bronchoscopy for tissue sampling

## 2020-08-28 NOTE — Progress Notes (Signed)
@Patient  ID: Paul Roth, male    DOB: 04-Feb-1950, 70 y.o.   MRN: [id number]  Chief Complaint  Patient presents with   Follow-up    Patient reports he has been more SOB since hospital follow up,  and he reports the blood clots have made it worse to breath while walking.    Referring provider: Bernerd Limbo, MD  HPI: 70 year old male, former smoker quit 1990 (35-pack-year history).  Past medical history significant for NSTEMI, pulmonary embolism, interstitial lung disease, neoplasm upper lobe left lung.  Patient of Dr. Shearon Stalls, seen in office on 07/17/2020 for sob consult.  08/28/2020 Patient presents today for 1 month follow-up.  During last office visit he was ordered for HRCT and PFTs which were not completed d/t recent hospital admission. He was admitted for acute pulmonary embolism and possible pneumonia on 08/04/20  after total shoulder arthroplasty in late June. He had an upper lobe mass on CT imaging, pulmonary felt this could possibly be due to pneumonia. He was treated with Omnicef and started on Eliquis 5 mg twice daily for the next 6 to 8 months.  He will need repeat CT chest to follow-up on lung mass to see if he needs biopsy.   His cough and post nasal drip have resolved with flonase. He has a very slight productive cough in the morning. His breathing has been alright at rest. He gets winded with short distances and with bending over. He is feeling a lot better now. He is doing some walking. He has incentive spirometer which he uses daily. He is a form marijuana smoker. RF and ANA were negative in June.   CT chest showed masslike opacity periphery left upper lobe appropximately 4.4cm. Findings of interstitial lung disease with basilar and peripheral predominate fibrosis/honeycombing.   No Known Allergies  Immunization History  Administered Date(s) Administered   Influenza Whole 11/27/2012   Influenza-Unspecified 11/12/2018   PFIZER Comirnaty(Gray Top)Covid-19 Tri-Sucrose  Vaccine 05/18/2020   PFIZER(Purple Top)SARS-COV-2 Vaccination 03/19/2019, 04/12/2019, 11/01/2019, 05/18/2020   Pneumococcal Conjugate-13 07/06/2019   Pneumococcal Polysaccharide-23 11/27/2012   Zoster, Live 11/27/2012    Past Medical History:  Diagnosis Date   Arthritis    Dyspnea    Hyperlipidemia    Tinnitus     Tobacco History: Social History   Tobacco Use  Smoking Status Former   Packs/day: 1.00   Years: 35.00   Pack years: 35.00   Types: Cigarettes   Quit date: 1990   Years since quitting: 32.6  Smokeless Tobacco Never   Counseling given: Not Answered   Outpatient Medications Prior to Visit  Medication Sig Dispense Refill   apixaban (ELIQUIS) 5 MG TABS tablet Take 1 tablet (5 mg total) by mouth 2 (two) times daily. 60 tablet 5   ascorbic acid (VITAMIN C) 100 MG tablet Take 100 mg by mouth daily.     atorvastatin (LIPITOR) 10 MG tablet Take 10 mg by mouth daily.     MAGNESIUM CITRATE PO Take 1 Scoop by mouth daily.     nortriptyline (PAMELOR) 75 MG capsule Take 75 mg by mouth at bedtime.     pantoprazole (PROTONIX) 40 MG tablet Take 1 tablet (40 mg total) by mouth 2 (two) times daily before a meal. 60 tablet 2   RESVERATROL PO Take 1 capsule by mouth daily.     tiZANidine (ZANAFLEX) 2 MG tablet Take 1 tablet (2 mg total) by mouth every 8 (eight) hours as needed (as needed for muscle pain). 30 tablet 0  traMADol (ULTRAM) 50 MG tablet Take 50 mg by mouth 2 (two) times daily.     VITAMIN D, CHOLECALCIFEROL, PO Take 1 tablet by mouth daily.     apixaban (ELIQUIS) 5 MG TABS tablet Take 2 tablets (10 mg total) by mouth 2 (two) times daily for 6 days. 12 tablet 0   HYDROcodone-acetaminophen (NORCO/VICODIN) 5-325 MG tablet Take 1 tablet by mouth every 6 (six) hours as needed for moderate pain or severe pain.     ondansetron (ZOFRAN) 4 MG tablet Take 4 mg by mouth every 8 (eight) hours as needed for vomiting or nausea.     prednisoLONE acetate (PRED FORTE) 1 % ophthalmic  suspension Place 1 drop into the right eye daily.     No facility-administered medications prior to visit.    Review of Systems  Review of Systems  Constitutional: Negative.   Respiratory:  Positive for shortness of breath. Negative for chest tightness and wheezing.        DOE, rare morning cough   Cardiovascular:  Negative for chest pain.    Physical Exam  BP 120/80 (BP Location: Left Arm, Patient Position: Sitting, Cuff Size: Normal)   Pulse 91   Ht 6\' 2"  (1.88 m)   Wt 176 lb 3.2 oz (79.9 kg)   SpO2 93%   BMI 22.62 kg/m  Physical Exam Constitutional:      Appearance: Normal appearance.  HENT:     Head: Normocephalic and atraumatic.     Mouth/Throat:     Comments: Deferred d/t mask Cardiovascular:     Rate and Rhythm: Normal rate and regular rhythm.  Pulmonary:     Breath sounds: Rales present.  Musculoskeletal:        General: Normal range of motion.  Skin:    Comments: Clubbing finger nails   Neurological:     General: No focal deficit present.     Mental Status: He is alert and oriented to person, place, and time. Mental status is at baseline.  Psychiatric:        Mood and Affect: Mood normal.        Behavior: Behavior normal.        Thought Content: Thought content normal.        Judgment: Judgment normal.     Lab Results:  CBC    Component Value Date/Time   WBC 10.6 (H) 08/08/2020 0318   RBC 4.08 (L) 08/08/2020 0318   HGB 12.8 (L) 08/08/2020 0318   HCT 37.7 (L) 08/08/2020 0318   PLT 289 08/08/2020 0318   MCV 92.4 08/08/2020 0318   MCH 31.4 08/08/2020 0318   MCHC 34.0 08/08/2020 0318   RDW 12.6 08/08/2020 0318   LYMPHSABS 2.6 08/08/2020 0318   MONOABS 1.0 08/08/2020 0318   EOSABS 0.4 08/08/2020 0318   BASOSABS 0.1 08/08/2020 0318    BMET    Component Value Date/Time   NA 137 08/08/2020 0318   K 3.9 08/08/2020 0318   CL 103 08/08/2020 0318   CO2 24 08/08/2020 0318   GLUCOSE 96 08/08/2020 0318   BUN 11 08/08/2020 0318   CREATININE 0.94  08/08/2020 0318   CALCIUM 8.8 (L) 08/08/2020 0318   GFRNONAA >60 08/08/2020 0318    BNP No results found for: BNP  ProBNP No results found for: PROBNP  Imaging: DG CHEST PORT 1 VIEW  Result Date: 08/07/2020 CLINICAL DATA:  Evaluate for pneumonia EXAM: PORTABLE CHEST 1 VIEW COMPARISON:  08/04/2020 FINDINGS: Normal heart size. Diffuse interstitial pattern to  the lungs likely represents underlying pulmonary fibrosis. Superimposed consolidation in the left upper lung is unchanged since prior study, suggesting focal pneumonia. Mass lesion is another consideration. See prior CT report 08/04/2020 small left pleural effusion. No pneumothorax. Postoperative changes in the right shoulder. IMPRESSION: No change since previous study. Diffuse interstitial lung disease with superimposed mass or consolidation in the left upper lung. Electronically Signed   By: Lucienne Capers M.D.   On: 08/07/2020 19:15   ECHOCARDIOGRAM COMPLETE  Result Date: 08/06/2020    ECHOCARDIOGRAM REPORT   Patient Name:   Paul Roth Date of Exam: 08/06/2020 Medical Rec #:  [id number]         Height:       74.0 in Accession #:    [id number]        Weight:       180.2 lb Date of Birth:  04/22/1950         BSA:          2.079 m Patient Age:    70 years          BP:           121/77 mmHg Patient Gender: M                 HR:           74 bpm. Exam Location:  Inpatient Procedure: 2D Echo, Color Doppler and Cardiac Doppler Indications:    I26.02 Pulmonary embolus  History:        Patient has no prior history of Echocardiogram examinations.                 Risk Factors:Dyslipidemia.  Sonographer:    Raquel Sarna Senior RDCS Referring Phys: [place]  1. Aortic dilatation noted. There is moderate dilatation of the aortic root, measuring 47 mm. There is moderate dilatation of the ascending aorta, measuring 47 mm.  2. Left ventricular ejection fraction, by estimation, is 55 to 60%. The left ventricle has normal function. The left  ventricle has no regional wall motion abnormalities. Left ventricular diastolic parameters are consistent with Grade I diastolic dysfunction (impaired relaxation).  3. Right ventricular systolic function is normal. The right ventricular size is normal. Tricuspid regurgitation signal is inadequate for assessing PA pressure.  4. The mitral valve is normal in structure. No evidence of mitral valve regurgitation. No evidence of mitral stenosis.  5. The aortic valve is tricuspid. Aortic valve regurgitation is not visualized. No aortic stenosis is present.  6. The inferior vena cava is normal in size with greater than 50% respiratory variability, suggesting right atrial pressure of 3 mmHg. Comparison(s): No prior Echocardiogram. Conclusion(s)/Recommendation(s): Consider secondary imaging for the aortic root and ascending aorta (CTA vs MRA) for further evaluation if clinically indicated. FINDINGS  Left Ventricle: Left ventricular ejection fraction, by estimation, is 55 to 60%. The left ventricle has normal function. The left ventricle has no regional wall motion abnormalities. The left ventricular internal cavity size was normal in size. There is  no left ventricular hypertrophy. Left ventricular diastolic parameters are consistent with Grade I diastolic dysfunction (impaired relaxation). Right Ventricle: The right ventricular size is normal. No increase in right ventricular wall thickness. Right ventricular systolic function is normal. Tricuspid regurgitation signal is inadequate for assessing PA pressure. Left Atrium: Left atrial size was normal in size. Right Atrium: Right atrial size was normal in size. Pericardium: There is no evidence of pericardial effusion. Mitral Valve: The mitral valve is normal  in structure. No evidence of mitral valve regurgitation. No evidence of mitral valve stenosis. Tricuspid Valve: The tricuspid valve is normal in structure. Tricuspid valve regurgitation is not demonstrated. No evidence of  tricuspid stenosis. Aortic Valve: The aortic valve is tricuspid. Aortic valve regurgitation is not visualized. No aortic stenosis is present. Pulmonic Valve: The pulmonic valve was not well visualized. Pulmonic valve regurgitation is not visualized. No evidence of pulmonic stenosis. Aorta: Aortic dilatation noted. There is moderate dilatation of the aortic root, measuring 47 mm. There is moderate dilatation of the ascending aorta, measuring 47 mm. Venous: The inferior vena cava is normal in size with greater than 50% respiratory variability, suggesting right atrial pressure of 3 mmHg. IAS/Shunts: The atrial septum is grossly normal.  LEFT VENTRICLE PLAX 2D LVIDd:         4.40 cm  Diastology LVIDs:         3.00 cm  LV e' medial:    7.29 cm/s LV PW:         1.00 cm  LV E/e' medial:  7.8 LV IVS:        0.90 cm  LV e' lateral:   11.00 cm/s LVOT diam:     2.25 cm  LV E/e' lateral: 5.2 LV SV:         86 LV SV Index:   41 LVOT Area:     3.98 cm  RIGHT VENTRICLE RV S prime:     9.03 cm/s TAPSE (M-mode): 2.1 cm LEFT ATRIUM             Index       RIGHT ATRIUM           Index LA diam:        3.00 cm 1.44 cm/m  RA Area:     12.40 cm LA Vol (A2C):   40.3 ml 19.38 ml/m RA Volume:   25.40 ml  12.22 ml/m LA Vol (A4C):   29.5 ml 14.19 ml/m LA Biplane Vol: 36.5 ml 17.55 ml/m  AORTIC VALVE LVOT Vmax:   101.00 cm/s LVOT Vmean:  68.100 cm/s LVOT VTI:    0.217 m  AORTA Ao Root diam: 4.70 cm Ao Asc diam:  4.70 cm MITRAL VALVE MV Area (PHT): 2.70 cm    SHUNTS MV Decel Time: 281 msec    Systemic VTI:  0.22 m MV E velocity: 56.90 cm/s  Systemic Diam: 2.25 cm MV A velocity: 77.10 cm/s MV E/A ratio:  0.74 Rudean Haskell MD Electronically signed by Rudean Haskell MD Signature Date/Time: 08/06/2020/3:18:04 PM    Final    VAS [place] LOWER EXTREMITY VENOUS (DVT)  Result Date: 08/05/2020  Lower Venous DVT Study Patient Name:  Paul Roth  Date of Exam:   08/05/2020 Medical Rec #: [id number]          Accession #:    [id number]  Date of Birth: 05-22-1950          Patient Gender: M Patient Age:   69Y Exam Location:  [facility] Procedure:      VAS [place] LOWER EXTREMITY VENOUS (DVT) Referring Phys: [id number] WERT --------------------------------------------------------------------------------  Indications: Pulmonary embolism.  Risk Factors: None identified. Comparison Study: No prior studies. Performing Technologist: Oliver Hum RVT  Examination Guidelines: A complete evaluation includes B-mode imaging, spectral Doppler, color Doppler, and power Doppler as needed of all accessible portions of each vessel. Bilateral testing is considered an integral part of a complete examination. Limited examinations for reoccurring indications may be  performed as noted. The reflux portion of the exam is performed with the patient in reverse Trendelenburg.  +---------+---------------+---------+-----------+----------+--------------+ RIGHT    CompressibilityPhasicitySpontaneityPropertiesThrombus Aging +---------+---------------+---------+-----------+----------+--------------+ CFV      Full           Yes      Yes                                 +---------+---------------+---------+-----------+----------+--------------+ SFJ      Full                                                        +---------+---------------+---------+-----------+----------+--------------+ FV Prox  Full                                                        +---------+---------------+---------+-----------+----------+--------------+ FV Mid   Full                                                        +---------+---------------+---------+-----------+----------+--------------+ FV DistalFull                                                        +---------+---------------+---------+-----------+----------+--------------+ PFV      Full                                                         +---------+---------------+---------+-----------+----------+--------------+ POP      Full           Yes      Yes                                 +---------+---------------+---------+-----------+----------+--------------+ PTV      Full                                                        +---------+---------------+---------+-----------+----------+--------------+ PERO     Full                                                        +---------+---------------+---------+-----------+----------+--------------+   +---------+---------------+---------+-----------+----------+--------------+ LEFT     CompressibilityPhasicitySpontaneityPropertiesThrombus Aging +---------+---------------+---------+-----------+----------+--------------+ CFV      Full           Yes  Yes                                 +---------+---------------+---------+-----------+----------+--------------+ SFJ      Full                                                        +---------+---------------+---------+-----------+----------+--------------+ FV Prox  Full                                                        +---------+---------------+---------+-----------+----------+--------------+ FV Mid   Full                                                        +---------+---------------+---------+-----------+----------+--------------+ FV DistalFull                                                        +---------+---------------+---------+-----------+----------+--------------+ PFV      Full                                                        +---------+---------------+---------+-----------+----------+--------------+ POP      Full           Yes      Yes                                 +---------+---------------+---------+-----------+----------+--------------+ PTV      Full                                                         +---------+---------------+---------+-----------+----------+--------------+ PERO     Full                                                        +---------+---------------+---------+-----------+----------+--------------+     Summary: RIGHT: - There is no evidence of deep vein thrombosis in the lower extremity.  - No cystic structure found in the popliteal fossa.  LEFT: - There is no evidence of deep vein thrombosis in the lower extremity.  - No cystic structure found in the popliteal fossa.  *See table(s) above for measurements and observations. Electronically signed by Ruta Hinds MD on 08/05/2020 at 4:25:34 PM.    Final  Assessment & Plan:   Interstitial lung disease ([place]) - Hx marijuana use/ + family hx lung disease.  Recommend pushing out PFTs and HRCT 6 weeks to allow time for him to recover from recent pneumonia. He also needs to complete ILD packet and have a 6MWT prior to his next visit.   Pulmonary embolism ([facility]) - Provoked PE, needs anticoagulation for 6-8 months   Mass of upper lobe of left lung - Acute findings consistent with PE plus questionable pneumonia vs infarction. Treated with course of  omnicef, we will repeat imaging in 6-8 weeks. If area of concern has not resolved or improved, he will need bronchoscopy for tissue sampling    Dyspnea - Seen by Dr. Shearon Stalls for initial consult in June 2022 for dyspnea symptoms. He will need to reschedule PFTs and follow-up afterwards with her to review      Martyn Ehrich, NP 09/04/2020

## 2020-08-28 NOTE — Telephone Encounter (Signed)
Per appt desk pt was scheduled for CT on 7/6 and it was cancelled due to he was in the hospital and they noted he had CT angio on 7/2 while he was in the hospital.  Does the CT need to be rescheduled since he had the CT angio?

## 2020-08-28 NOTE — Telephone Encounter (Signed)
Needs HRCT in 6 weeks, ordered in June by Dr. Shearon Stalls

## 2020-08-29 NOTE — Telephone Encounter (Addendum)
CT has been scheduled for 9/6 at 11:00.  Left vm for pt to call me back for appt info.

## 2020-08-29 NOTE — Telephone Encounter (Signed)
Attempted to call pt to get him scheduled for a 27mw prior to OV 9/14 but unable to reach. Left pt a message for him to return call.   When pt returns call, we need to schedule pt for a 31mw test prior to pt's upcoming OV 9/14. Pt has a PFT scheduled same day so 74mw will have to be on a different day but we can get it scheduled beginning of Sept if okay with pt.  Routing to front desk pool to help get this scheduled.

## 2020-08-30 ENCOUNTER — Other Ambulatory Visit: Payer: Medicare Other

## 2020-08-30 DIAGNOSIS — J849 Interstitial pulmonary disease, unspecified: Secondary | ICD-10-CM

## 2020-08-31 LAB — SJOGREN'S SYNDROME ANTIBODS(SSA + SSB)
SSA (Ro) (ENA) Antibody, IgG: <1 AI
SSB (La) (ENA) Antibody, IgG: <1 AI

## 2020-08-31 LAB — ANCA SCREEN W REFLEX TITER: ANCA Screen: NEGATIVE

## 2020-08-31 LAB — ANTI-SCLERODERMA ANTIBODY: Scleroderma (Scl-70) (ENA) Antibody, IgG: <1 AI

## 2020-08-31 NOTE — Telephone Encounter (Signed)
Spoke to pt & gave him appt info for CT.  Nothing further needed.

## 2020-09-04 NOTE — Assessment & Plan Note (Signed)
-   Seen by Dr. Shearon Stalls for initial consult in June 2022 for dyspnea symptoms. He will need to reschedule PFTs and follow-up afterwards with her to review

## 2020-09-05 LAB — HYPERSENSITIVITY PNEUMONITIS
A. Pullulans Abs: NEGATIVE
A.Fumigatus #1 Abs: NEGATIVE
Micropolyspora faeni, IgG: NEGATIVE
Pigeon Serum Abs: NEGATIVE
Thermoact. Saccharii: NEGATIVE
Thermoactinomyces vulgaris, IgG: NEGATIVE

## 2020-09-09 ENCOUNTER — Emergency Department (HOSPITAL_COMMUNITY): Payer: Medicare Other

## 2020-09-09 ENCOUNTER — Other Ambulatory Visit: Payer: Self-pay

## 2020-09-09 ENCOUNTER — Encounter (HOSPITAL_COMMUNITY): Payer: Self-pay

## 2020-09-09 ENCOUNTER — Emergency Department (HOSPITAL_COMMUNITY)
Admission: EM | Admit: 2020-09-09 | Discharge: 2020-09-09 | Disposition: A | Discharge status: Home / Self Care | Payer: Medicare Other | Attending: Emergency Medicine | Admitting: Emergency Medicine

## 2020-09-09 DIAGNOSIS — R000 Tachycardia, unspecified: Secondary | ICD-10-CM | POA: Diagnosis not present

## 2020-09-09 DIAGNOSIS — Z87891 Personal history of nicotine dependence: Secondary | ICD-10-CM | POA: Diagnosis not present

## 2020-09-09 DIAGNOSIS — Z96611 Presence of right artificial shoulder joint: Secondary | ICD-10-CM | POA: Diagnosis not present

## 2020-09-09 DIAGNOSIS — G4700 Insomnia, unspecified: Secondary | ICD-10-CM | POA: Diagnosis not present

## 2020-09-09 DIAGNOSIS — Z7901 Long term (current) use of anticoagulants: Secondary | ICD-10-CM | POA: Diagnosis not present

## 2020-09-09 DIAGNOSIS — R918 Other nonspecific abnormal finding of lung field: Secondary | ICD-10-CM

## 2020-09-09 DIAGNOSIS — Z20822 Contact with and (suspected) exposure to covid-19: Secondary | ICD-10-CM | POA: Insufficient documentation

## 2020-09-09 DIAGNOSIS — R252 Cramp and spasm: Secondary | ICD-10-CM | POA: Diagnosis not present

## 2020-09-09 DIAGNOSIS — R0602 Shortness of breath: Secondary | ICD-10-CM | POA: Diagnosis present

## 2020-09-09 LAB — COMPREHENSIVE METABOLIC PANEL
ALT: 22 U/L (ref 0–44)
AST: 19 U/L (ref 15–41)
Albumin: 4.1 g/dL (ref 3.5–5.0)
Alkaline Phosphatase: 84 U/L (ref 38–126)
Anion gap: 10 (ref 5–15)
BUN: 13 mg/dL (ref 8–23)
CO2: 27 mmol/L (ref 22–32)
Calcium: 9.8 mg/dL (ref 8.9–10.3)
Chloride: 97 mmol/L — ABNORMAL LOW (ref 98–111)
Creatinine, Ser: 1.22 mg/dL (ref 0.61–1.24)
GFR, Estimated: >60 mL/min (ref >60)
Glucose, Bld: 113 mg/dL — ABNORMAL HIGH (ref 70–99)
Potassium: 4.1 mmol/L (ref 3.5–5.1)
Sodium: 134 mmol/L — ABNORMAL LOW (ref 135–145)
Total Bilirubin: 0.8 mg/dL (ref 0.3–1.2)
Total Protein: 8 g/dL (ref 6.5–8.1)

## 2020-09-09 LAB — CBC WITH DIFFERENTIAL/PLATELET
Abs Immature Granulocytes: 0.05 10*3/uL (ref 0.00–0.07)
Basophils Absolute: 0.1 10*3/uL (ref 0.0–0.1)
Basophils Relative: 1 %
Eosinophils Absolute: 0.2 10*3/uL (ref 0.0–0.5)
Eosinophils Relative: 2 %
HCT: 48.2 % (ref 39.0–52.0)
Hemoglobin: 16.4 g/dL (ref 13.0–17.0)
Immature Granulocytes: 1 %
Lymphocytes Relative: 14 %
Lymphs Abs: 1.2 10*3/uL (ref 0.7–4.0)
MCH: 31.3 pg (ref 26.0–34.0)
MCHC: 34 g/dL (ref 30.0–36.0)
MCV: 92 fL (ref 80.0–100.0)
Monocytes Absolute: 0.8 10*3/uL (ref 0.1–1.0)
Monocytes Relative: 9 %
Neutro Abs: 6.5 10*3/uL (ref 1.7–7.7)
Neutrophils Relative %: 73 %
Platelets: 275 10*3/uL (ref 150–400)
RBC: 5.24 MIL/uL (ref 4.22–5.81)
RDW: 12.9 % (ref 11.5–15.5)
WBC: 8.8 10*3/uL (ref 4.0–10.5)
nRBC: 0 % (ref 0.0–0.2)

## 2020-09-09 LAB — RESP PANEL BY RT-PCR (FLU A&B, COVID) ARPGX2
Influenza A by PCR: NEGATIVE
Influenza B by PCR: NEGATIVE
SARS Coronavirus 2 by RT PCR: NEGATIVE

## 2020-09-09 LAB — URINALYSIS, ROUTINE W REFLEX MICROSCOPIC
Bilirubin Urine: NEGATIVE
Glucose, UA: NEGATIVE mg/dL
Hgb urine dipstick: NEGATIVE
Ketones, ur: NEGATIVE mg/dL
Leukocytes,Ua: NEGATIVE
Nitrite: NEGATIVE
Protein, ur: NEGATIVE mg/dL
Specific Gravity, Urine: 1.01 (ref 1.005–1.030)
pH: 7 (ref 5.0–8.0)

## 2020-09-09 LAB — LACTIC ACID, PLASMA: Lactic Acid, Venous: 1.6 mmol/L (ref 0.5–1.9)

## 2020-09-09 LAB — TROPONIN I (HIGH SENSITIVITY)
Troponin I (High Sensitivity): 24 ng/L — ABNORMAL HIGH (ref <18)
Troponin I (High Sensitivity): 20 ng/L — ABNORMAL HIGH (ref <18)

## 2020-09-09 MED ORDER — SODIUM CHLORIDE 0.9 % IV BOLUS
1000.0000 mL | Freq: Once | INTRAVENOUS | Status: AC
  Administered 2020-09-09: 1000 mL via INTRAVENOUS

## 2020-09-09 MED ORDER — ROPINIROLE HCL 2 MG PO TABS: 2.0000 mg | ORAL_TABLET | Freq: Every day | ORAL | 0 refills | Status: DC

## 2020-09-09 MED ORDER — ROPINIROLE HCL 1 MG PO TABS
2.0000 mg | ORAL_TABLET | Freq: Once | ORAL | Status: AC
  Administered 2020-09-09: 2 mg via ORAL
  Filled 2020-09-09: qty 2

## 2020-09-09 MED ORDER — IOHEXOL 350 MG/ML SOLN
80.0000 mL | Freq: Once | INTRAVENOUS | Status: AC | PRN
  Administered 2020-09-09: 80 mL via INTRAVENOUS

## 2020-09-09 NOTE — ED Triage Notes (Signed)
Pt reports SHOB and palpitations that began yesterday that has become progressively worse. Pt was here recently for a PE and put on Eliquis. Pt denies chest pain.

## 2020-09-09 NOTE — ED Notes (Signed)
Patient transported to CT 

## 2020-09-09 NOTE — ED Provider Notes (Signed)
[place] DEPT Provider Note   CSN: [id number] Arrival date & time: 09/09/20  1759     History Chief Complaint  Patient presents with   Shortness of Breath   Palpitations    Paul Roth is a 70 y.o. male.  Pt presents to the ED today with sob.  Pt was admitted from 7/2-6 for a PE after shoulder surgery.  The pt was d/c with Eliquis.  Pt also had a left upper lobe lung mass.  It was treated as pneumonia with omnicef. The pt also has a hx of interstitial lung disease.  The pt is followed by pulmonology.  Pt said he had been felling well until the last 2 days, when his sob worsened.  The pt denies any cp, but said his HR has been elevated.  Pt also notes that he's been having trouble sleeping.  Ambien makes him extremely depressed.  Oxycodone and Vicodin make  him feel bad.  He was taking Tramadol which helped, but he is out.  He complains of twitching in his legs.        Past Medical History:  Diagnosis Date   Arthritis    Dyspnea    Hyperlipidemia    Tinnitus     Patient Active Problem List   Diagnosis Date Noted   Pulmonary embolism ([facility]) 08/04/2020   NSTEMI (non-ST elevated myocardial infarction) ([place]) 08/04/2020   Interstitial lung disease ([place]) 08/04/2020   Dyspnea 08/04/2020   Mass of upper lobe of left lung 08/04/2020   Leukocytosis 08/04/2020    Past Surgical History:  Procedure Laterality Date   EYE SURGERY     TOTAL SHOULDER ARTHROPLASTY Right 07/26/2020   Procedure: TOTAL SHOULDER ARTHROPLASTY;  Surgeon: Tania Ade, MD;  Location: [facility];  Service: Orthopedics;  Laterality: Right;       Family History  Problem Relation Age of Onset   Lung disease Mother    Lung disease Maternal Aunt     Social History   Tobacco Use   Smoking status: Former    Packs/day: 1.00    Years: 35.00    Pack years: 35.00    Types: Cigarettes    Quit date: 1990    Years since quitting: 32.6   Smokeless tobacco: Never  Vaping Use    Vaping Use: Never used  Substance Use Topics   Alcohol use: Yes    Comment: rare   Drug use: Not Currently    Types: Marijuana    Comment: hx of years ago    Home Medications Prior to Admission medications   Medication Sig Start Date End Date Taking? Authorizing Provider  rOPINIRole (REQUIP) 2 MG tablet Take 1 tablet (2 mg total) by mouth at bedtime. 09/09/20  Yes Isla Pence, MD  apixaban (ELIQUIS) 5 MG TABS tablet Take 1 tablet (5 mg total) by mouth 2 (two) times daily. 08/14/20 09/13/20  Shawna Clamp, MD  ascorbic acid (VITAMIN C) 100 MG tablet Take 100 mg by mouth daily.    [provider]  atorvastatin (LIPITOR) 10 MG tablet Take 10 mg by mouth daily. 05/15/20   [provider]  MAGNESIUM CITRATE PO Take 1 Scoop by mouth daily.    [provider]  nortriptyline (PAMELOR) 75 MG capsule Take 75 mg by mouth at bedtime. 06/23/20   [provider]  pantoprazole (PROTONIX) 40 MG tablet Take 1 tablet (40 mg total) by mouth 2 (two) times daily before a meal. 08/08/20   Shawna Clamp, MD  RESVERATROL PO Take 1 capsule by mouth daily.    [provider]  tiZANidine (ZANAFLEX) 2 MG tablet Take 1 tablet (2 mg total) by mouth every 8 (eight) hours as needed (as needed for muscle pain). 07/26/20   Porterfield, Amber, PA-C  traMADol (ULTRAM) 50 MG tablet Take 50 mg by mouth 2 (two) times daily.    [provider]  VITAMIN D, CHOLECALCIFEROL, PO Take 1 tablet by mouth daily.    [provider]    Allergies    Patient has no known allergies.  Review of Systems   Review of Systems  Respiratory:  Positive for shortness of breath.   Cardiovascular:  Positive for palpitations.  All other systems reviewed and are negative.  Physical Exam Updated Vital Signs BP 133/90   Pulse 92   Temp 98.1 F (36.7 C) (Oral)   Resp 19   SpO2 97%   Physical Exam Vitals and nursing note reviewed.  Constitutional:      Appearance: He is  well-developed.  HENT:     Head: Normocephalic and atraumatic.     Mouth/Throat:     Mouth: Mucous membranes are moist.     Pharynx: Oropharynx is clear.  Eyes:     Extraocular Movements: Extraocular movements intact.     Pupils: Pupils are equal, round, and reactive to light.  Cardiovascular:     Rate and Rhythm: Regular rhythm. Tachycardia present.  Pulmonary:     Effort: Tachypnea present.  Abdominal:     General: Bowel sounds are normal.     Palpations: Abdomen is soft.  Musculoskeletal:        General: Normal range of motion.     Cervical back: Normal range of motion and neck supple.     Comments: Clubbing to nails  Skin:    General: Skin is warm.     Capillary Refill: Capillary refill takes less than 2 seconds.  Neurological:     General: No focal deficit present.     Mental Status: He is alert and oriented to person, place, and time.  Psychiatric:        Mood and Affect: Mood normal.        Behavior: Behavior normal.    ED Results / Procedures / Treatments   Labs (all labs ordered are listed, but only abnormal results are displayed) Labs Reviewed  COMPREHENSIVE METABOLIC PANEL - Abnormal; Notable for the following components:      Result Value   Sodium 134 (*)    Chloride 97 (*)    Glucose, Bld 113 (*)    All other components within normal limits  TROPONIN I (HIGH SENSITIVITY) - Abnormal; Notable for the following components:   Troponin I (High Sensitivity) 24 (*)    All other components within normal limits  TROPONIN I (HIGH SENSITIVITY) - Abnormal; Notable for the following components:   Troponin I (High Sensitivity) 20 (*)    All other components within normal limits  RESP PANEL BY RT-PCR (FLU A&B, COVID) ARPGX2  CULTURE, BLOOD (ROUTINE X 2)  CULTURE, BLOOD (ROUTINE X 2)  CBC WITH DIFFERENTIAL/PLATELET  URINALYSIS, ROUTINE W REFLEX MICROSCOPIC  LACTIC ACID, PLASMA    EKG EKG Interpretation  Date/Time:  Sunday September 09 2020 18:30:06 EDT Ventricular  Rate:  115 PR Interval:  181 QRS Duration: 98 QT Interval:  313 QTC Calculation: 433 R Axis:   231 Text Interpretation: Sinus tachycardia RSR' in V1 or V2, probably normal variant Inferior infarct, old Consider anterior  infarct 12 Lead; [place] Confirmed by Davonna Belling [phone]) on 09/09/2020 6:38:22 PM  Radiology CT Angio Chest PE W and/or Wo Contrast  Result Date: 09/09/2020 CLINICAL DATA:  Shortness of breath and cardiac palpitations, history of pulmonary embolism EXAM: CT ANGIOGRAPHY CHEST WITH CONTRAST TECHNIQUE: Multidetector CT imaging of the chest was performed using the standard protocol during bolus administration of intravenous contrast. Multiplanar CT image reconstructions and MIPs were obtained to evaluate the vascular anatomy. CONTRAST:  3mL OMNIPAQUE IOHEXOL 350 MG/ML SOLN COMPARISON:  08/04/2020 FINDINGS: Cardiovascular: Thoracic aorta and its branches show atherosclerotic calcifications. Mild dilatation of the ascending aorta is noted to 4.2 cm. No evidence of dissection is noted. The pulmonary artery is well visualized and demonstrates a normal branching pattern. The previously seen filling defects in the left upper lobe branches are not as well appreciated on today's exam although no central attenuation of the upper lobe pulmonary arterial branches is noted related to adjacent lymphadenopathy and progressive airspace opacity. No significant coronary calcifications are noted. Mediastinum/Nodes: Thoracic inlet is within normal limits. Scattered AP window and prevascular lymphadenopathy is noted which has increased in size when compared with the prior exam. Dominant node measures approximately 2.0 cm in short axis and previously measured 1.6 cm in short axis. These have all progressed in size when compared with the prior exam. Central left hilar lymph nodes are seen which appear relatively stable in appearance measuring up to 19 mm in short axis. Subcarinal adenopathy is noted  measuring 2.3 cm in short axis increased in size from the prior exam at which time it measured 2 cm in short axis. Few scattered small right hilar lymph nodes are seen although not significant by size criteria. The esophagus as visualized is within normal limits with the exception of a small sliding-type hiatal hernia. Lungs/Pleura: The lungs are well aerated bilaterally. Persistent interstitial lung disease is noted as well as superimposed emphysematous change. Diffuse honeycombing in the bases is noted. There is again noted a focal masslike density in the posterolateral aspect of the left upper lobe. This measures approximately 4.5 by 2.6 cm in greatest transverse and AP dimensions. It measures up to 5.7 cm in craniocaudad projection although evaluation is somewhat limited due to the surrounding increase in airspace opacity. Some increasing associated airspace disease is noted likely related to some central obstructive changes and attenuation of the bronchial tree. Upper Abdomen: Visualized upper abdomen demonstrates scattered hypodensities within the liver again likely representing cysts. Renal cystic change on the right is noted stable in appearance from the prior exam. Right adrenal gland appears prominent when compared with the prior exam measuring up to 19 mm. This likely represents an adenoma although the possibility metastatic disease could not be totally excluded. Musculoskeletal: Degenerative changes of the thoracic spine are noted. No acute rib abnormality is seen. Review of the MIP images confirms the above findings. IMPRESSION: Progressive increased airspace opacity in the left upper lobe posterolaterally as described with some increase in mediastinal and left hilar adenopathy again suspicious for neoplasm although aggressive infectious infiltrate could not be totally excluded. New areas of bronchial narrowing are seen when compared with the prior exam. Bronchoscopic evaluation is recommended for  further diagnostic purposes. Previously seen pulmonary artery emboli on the left are not visualized on today's exam. No new emboli are seen. There is however some increased attenuation and narrowing of the pulmonary arterial structures secondary to the hilar and parenchymal disease. Underlying interstitial lung disease with diffuse basilar honeycombing. Stable hypodensities within  the liver and right kidney. These likely represent cysts. Prominent right adrenal gland increased when compared with the prior exam. This may simply represent an adenoma although metastatic disease could not be totally excluded given the findings in the chest. Aortic Atherosclerosis (ICD10-I70.0) and Emphysema (ICD10-J43.9). Electronically Signed   By: Inez Catalina M.D.   On: 09/09/2020 21:21    Procedures Procedures   Medications Ordered in ED Medications  rOPINIRole (REQUIP) tablet 2 mg (has no administration in time range)  sodium chloride 0.9 % bolus 1,000 mL (0 mLs Intravenous Stopped 09/09/20 2032)  iohexol (OMNIPAQUE) 350 MG/ML injection 80 mL (80 mLs Intravenous Contrast Given 09/09/20 2044)    ED Course  I have reviewed the triage vital signs and the nursing notes.  Pertinent labs & imaging results that were available during my care of the patient were reviewed by me and considered in my medical decision making (see chart for details).    MDM Rules/Calculators/A&P                           HR has improved with IVFs.  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  Pt said he is scheduled for bronchoscopy in mid-Sept.  Pt told to call his pulmonologist tomorrow and let them know he had a repeat CT and to see if he needs the bronch earlier.  Pt is a former heavy smoker.  He also used to smoke MJ heavily.  Pt sounds like he may have RLS.  I will put him on Requip and see how he does.  Final Clinical Impression(s) / ED Diagnoses Final diagnoses:  Sinus tachycardia   Insomnia, unspecified type  Mass of left lung    Rx / DC Orders ED Discharge Orders          Ordered    rOPINIRole (REQUIP) 2 MG tablet  Daily at bedtime        09/09/20 2209             Isla Pence, MD 09/09/20 2209

## 2020-09-12 ENCOUNTER — Other Ambulatory Visit: Payer: Self-pay

## 2020-09-12 ENCOUNTER — Encounter: Payer: Self-pay | Admitting: Acute Care

## 2020-09-12 ENCOUNTER — Ambulatory Visit (INDEPENDENT_AMBULATORY_CARE_PROVIDER_SITE_OTHER): Payer: Medicare Other | Admitting: Acute Care

## 2020-09-12 VITALS — BP 120/70 | HR 105 | Ht 74.0 in | Wt 174.6 lb

## 2020-09-12 DIAGNOSIS — J189 Pneumonia, unspecified organism: Secondary | ICD-10-CM | POA: Diagnosis not present

## 2020-09-12 DIAGNOSIS — R918 Other nonspecific abnormal finding of lung field: Secondary | ICD-10-CM

## 2020-09-12 DIAGNOSIS — R0600 Dyspnea, unspecified: Secondary | ICD-10-CM

## 2020-09-12 DIAGNOSIS — R634 Abnormal weight loss: Secondary | ICD-10-CM

## 2020-09-12 DIAGNOSIS — J849 Interstitial pulmonary disease, unspecified: Secondary | ICD-10-CM

## 2020-09-12 DIAGNOSIS — R0609 Other forms of dyspnea: Secondary | ICD-10-CM

## 2020-09-12 DIAGNOSIS — G2581 Restless legs syndrome: Secondary | ICD-10-CM

## 2020-09-12 DIAGNOSIS — I2699 Other pulmonary embolism without acute cor pulmonale: Secondary | ICD-10-CM

## 2020-09-12 MED ORDER — ROPINIROLE HCL 2 MG PO TABS: 2.0000 mg | ORAL_TABLET | Freq: Every day | ORAL | 1 refills | Status: AC

## 2020-09-12 MED ORDER — ALBUTEROL SULFATE HFA 108 (90 BASE) MCG/ACT IN AERS: 2.0000 | INHALATION_SPRAY | Freq: Four times a day (QID) | RESPIRATORY_TRACT | 6 refills | Status: AC | PRN

## 2020-09-12 NOTE — Patient Instructions (Addendum)
It is good to see you today. We will get you scheduled for bronchoscopy , biopsy and cultures as soon as we can.( Dr. Shearon Stalls, or whoever is available to do most expedited) You will get a call to schedule this.  We will send you home with a sputum cup. Please collect sputum in the morning and bring it to the lab within 4 hours of collection . ( Culture/ AFB, and Fungal)  I will send in a prescription for albuterol to use as needed  We will send in a prescription for Requipt. Continue protonix daily as you have been doing.  We will give you a copy of the GERD diet Continue Eliquis 5 mg twice daily as you have been doing  Follow up after bronch. Needs to schedule PFT's  Please contact office for sooner follow up if symptoms do not improve or worsen or seek emergency care

## 2020-09-12 NOTE — H&P (View-Only) (Signed)
History of Present Illness Paul Roth is a 70 y.o. male former smoker quit 1990 (35-pack-year history). Smoked pot until 2 years ago.  Past medical history significant for NSTEMI, pulmonary embolism after shoulder surgery 07/2020, interstitial lung disease, and ? neoplasm vs infection of upper lobe left lung.  Patient of Dr. Shearon Stalls, seen in office on 07/17/2020 for sob consult.  Synopsis Recent hospital admission. He was admitted for acute pulmonary embolism and possible pneumonia on 08/04/20  after total shoulder arthroplasty in late June. He had an upper lobe mass on CT imaging, pulmonary felt this could possibly be due to pneumonia. He was treated with Omnicef and started on Eliquis 5 mg twice daily for the next 6 to 8 months.  Original plan was to CT chest early September, however patient developed dyspnea on 09/09/2020 and sought care in the ED.  ED work-up included CTA chest to evaluate for additional clot burden despite anticoagulation therapy.  CTA was negative for additional clot burden however showed some progressive changes to the left upper lobe area of concern.  ED MD discussed with the patient that she was very concerned that this was a neoplasm with a smoking history and recommended he have bronchoscopy as soon as possible for tissue diagnosis versus culture confirmation of infection.  Patient is highly anxious and wishes to have bronchoscopy as soon as possible.  09/13/2020 Pt. Presents for follow up. He was seen in the hospital over the weekend for dyspnea . A CTA Chest was done which shows Progressive increased airspace opacity in the left upper lobe posterolaterally as described with some increase in mediastinal and left hilar adenopathy again suspicious for neoplasm although aggressive infectious infiltrate could not be totally excluded. New areas of bronchial narrowing are seen when compared with the prior exam.  The original plan was to wait until September for follow up CT Chest,  however the patient is very anxious.His dyspnea is much worse. He would like to have the bronchoscopy now. The MD that he saw in the ED felt the patient needed bronch much sooner.  He denies fever, chest pain, or hemoptysis.  He does endorse weight loss over the last 2 months.  He endorses dyspnea that he feels has gotten worse over the last 2 months.   In looking over his visits to this office over the last 8 weeks, weight was 189 pounds on 07/17/20, 176 pounds on 08/28/2020, and 174 pounds on 09/12/2020.   This is a 15 pound weight loss over the last 2 months, on the same scale. .  Test Results: CTA Chest 09/09/2020 Mediastinum/Nodes: Thoracic inlet is within normal limits. Scattered AP window and prevascular lymphadenopathy is noted which has increased in size when compared with the prior exam. Dominant node measures approximately 2.0 cm in short axis and previously measured 1.6 cm in short axis. These have all progressed in size when compared with the prior exam. Central left hilar lymph nodes are seen which appear relatively stable in appearance measuring up to 19 mm in short axis. Subcarinal adenopathy is noted measuring 2.3 cm in short axis increased in size from the prior exam at which time it measured 2 cm in short axis. Few scattered small right hilar lymph nodes are seen although not significant by size criteria. The esophagus as visualized is within normal limits with the exception of a small sliding-type hiatal hernia.   Lungs/Pleura: The lungs are well aerated bilaterally. Persistent interstitial lung disease is noted as well as superimposed emphysematous  change. Diffuse honeycombing in the bases is noted. There is again noted a focal masslike density in the posterolateral aspect of the left upper lobe. This measures approximately 4.5 by 2.6 cm in greatest transverse and AP dimensions. It measures up to 5.7 cm in craniocaudad projection although evaluation is somewhat limited due  to the surrounding increase in airspace opacity. Some increasing associated airspace disease is noted likely related to some central obstructive changes and attenuation of the bronchial tree.   CBC Latest Ref Rng & Units 09/09/2020 08/08/2020 08/07/2020  WBC 4.0 - 10.5 K/uL 8.8 10.6(H) 10.2  Hemoglobin 13.0 - 17.0 g/dL 16.4 12.8(L) 12.9(L)  Hematocrit 39.0 - 52.0 % 48.2 37.7(L) 38.0(L)  Platelets 150 - 400 K/uL 275 289 263    BMP Latest Ref Rng & Units 09/09/2020 08/08/2020 08/05/2020  Glucose 70 - 99 mg/dL 113(H) 96 93  BUN 8 - 23 mg/dL 13 11 11   Creatinine 0.61 - 1.24 mg/dL 1.22 0.94 0.85  Sodium 135 - 145 mmol/L 134(L) 137 136  Potassium 3.5 - 5.1 mmol/L 4.1 3.9 3.7  Chloride 98 - 111 mmol/L 97(L) 103 101  CO2 22 - 32 mmol/L 27 24 26   Calcium 8.9 - 10.3 mg/dL 9.8 8.8(L) 8.7(L)    BNP No results found for: BNP  ProBNP No results found for: PROBNP  PFT No results found for: FEV1PRE, FEV1POST, FVCPRE, FVCPOST, TLC, DLCOUNC, PREFEV1FVCRT, PSTFEV1FVCRT  CT Angio Chest PE W and/or Wo Contrast  Addendum Date: 09/10/2020   ADDENDUM REPORT: 09/10/2020 00:41 ADDENDUM: 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 Recommend annual imaging followup by CTA or MRA. This recommendation follows 2010 ACCF/AHA/AATS/ACR/ASA/SCA/SCAI/SIR/STS/SVM Guidelines for the Diagnosis and Management of Patients with Thoracic Aortic Disease. Circulation. 2010; 121: V371-G626. Aortic aneurysm NOS (ICD10-I71.9) Electronically Signed   By: Inez Catalina M.D.   On: 09/10/2020 00:41   Result Date: 09/10/2020 CLINICAL DATA:  Shortness of breath and cardiac palpitations, history of pulmonary embolism EXAM: CT ANGIOGRAPHY CHEST WITH CONTRAST TECHNIQUE: Multidetector CT imaging of the chest was performed using the standard protocol during bolus administration of intravenous contrast. Multiplanar CT image reconstructions and MIPs were obtained to evaluate the vascular anatomy. CONTRAST:  70mL OMNIPAQUE IOHEXOL 350 MG/ML SOLN  COMPARISON:  08/04/2020 FINDINGS: Cardiovascular: Thoracic aorta and its branches show atherosclerotic calcifications. Mild dilatation of the ascending aorta is noted to 4.2 cm. No evidence of dissection is noted. The pulmonary artery is well visualized and demonstrates a normal branching pattern. The previously seen filling defects in the left upper lobe branches are not as well appreciated on today's exam although no central attenuation of the upper lobe pulmonary arterial branches is noted related to adjacent lymphadenopathy and progressive airspace opacity. No significant coronary calcifications are noted. Mediastinum/Nodes: Thoracic inlet is within normal limits. Scattered AP window and prevascular lymphadenopathy is noted which has increased in size when compared with the prior exam. Dominant node measures approximately 2.0 cm in short axis and previously measured 1.6 cm in short axis. These have all progressed in size when compared with the prior exam. Central left hilar lymph nodes are seen which appear relatively stable in appearance measuring up to 19 mm in short axis. Subcarinal adenopathy is noted measuring 2.3 cm in short axis increased in size from the prior exam at which time it measured 2 cm in short axis. Few scattered small right hilar lymph nodes are seen although not significant by size criteria. The esophagus as visualized is within normal limits with the exception of  a small sliding-type hiatal hernia. Lungs/Pleura: The lungs are well aerated bilaterally. Persistent interstitial lung disease is noted as well as superimposed emphysematous change. Diffuse honeycombing in the bases is noted. There is again noted a focal masslike density in the posterolateral aspect of the left upper lobe. This measures approximately 4.5 by 2.6 cm in greatest transverse and AP dimensions. It measures up to 5.7 cm in craniocaudad projection although evaluation is somewhat limited due to the surrounding increase in  airspace opacity. Some increasing associated airspace disease is noted likely related to some central obstructive changes and attenuation of the bronchial tree. Upper Abdomen: Visualized upper abdomen demonstrates scattered hypodensities within the liver again likely representing cysts. Renal cystic change on the right is noted stable in appearance from the prior exam. Right adrenal gland appears prominent when compared with the prior exam measuring up to 19 mm. This likely represents an adenoma although the possibility metastatic disease could not be totally excluded. Musculoskeletal: Degenerative changes of the thoracic spine are noted. No acute rib abnormality is seen. Review of the MIP images confirms the above findings. IMPRESSION: Progressive increased airspace opacity in the left upper lobe posterolaterally as described with some increase in mediastinal and left hilar adenopathy again suspicious for neoplasm although aggressive infectious infiltrate could not be totally excluded. New areas of bronchial narrowing are seen when compared with the prior exam. Bronchoscopic evaluation is recommended for further diagnostic purposes. Previously seen pulmonary artery emboli on the left are not visualized on today's exam. No new emboli are seen. There is however some increased attenuation and narrowing of the pulmonary arterial structures secondary to the hilar and parenchymal disease. Underlying interstitial lung disease with diffuse basilar honeycombing. Stable hypodensities within the liver and right kidney. These likely represent cysts. Prominent right adrenal gland increased when compared with the prior exam. This may simply represent an adenoma although metastatic disease could not be totally excluded given the findings in the chest. Aortic Atherosclerosis (ICD10-I70.0) and Emphysema (ICD10-J43.9). Electronically Signed: By: Inez Catalina M.D. On: 09/09/2020 21:21     Past medical hx Past Medical History:   Diagnosis Date   Arthritis    Dyspnea    Hyperlipidemia    Tinnitus      Social History   Tobacco Use   Smoking status: Former    Packs/day: 1.00    Years: 35.00    Pack years: 35.00    Types: Cigarettes    Quit date: 1990    Years since quitting: 32.6   Smokeless tobacco: Never  Vaping Use   Vaping Use: Never used  Substance Use Topics   Alcohol use: Yes    Comment: rare   Drug use: Not Currently    Types: Marijuana    Comment: hx of years ago    Mr.Hinnant reports that he quit smoking about 32 years ago. His smoking use included cigarettes. He has a 35.00 pack-year smoking history. He has never used smokeless tobacco. He reports current alcohol use. He reports that he does not currently use drugs after having used the following drugs: Marijuana.  Tobacco Cessation: Former tobacco smoker , Quit 1990 with a 35 pack year smoking history Quit smoking Mariajuana 2 years ago, 2020    Past surgical hx, Family hx, Social hx all reviewed.  Current Outpatient Medications on File Prior to Visit  Medication Sig   apixaban (ELIQUIS) 5 MG TABS tablet Take 1 tablet (5 mg total) by mouth 2 (two) times daily.   ascorbic acid (  VITAMIN C) 100 MG tablet Take 100 mg by mouth daily.   atorvastatin (LIPITOR) 10 MG tablet Take 10 mg by mouth daily.   MAGNESIUM CITRATE PO Take 1 Scoop by mouth daily.   nortriptyline (PAMELOR) 75 MG capsule Take 75 mg by mouth at bedtime.   pantoprazole (PROTONIX) 40 MG tablet Take 1 tablet (40 mg total) by mouth 2 (two) times daily before a meal.   RESVERATROL PO Take 1 capsule by mouth daily.   tiZANidine (ZANAFLEX) 2 MG tablet Take 1 tablet (2 mg total) by mouth every 8 (eight) hours as needed (as needed for muscle pain).   VITAMIN D, CHOLECALCIFEROL, PO Take 1 tablet by mouth daily.   No current facility-administered medications on file prior to visit.     No Known Allergies  Review Of Systems:  Constitutional:   + weight loss, No night sweats,   Fevers, chills, fatigue, or  lassitude.  HEENT:   No headaches,  Difficulty swallowing,  Tooth/dental problems, or  Sore throat,                No sneezing, itching, ear ache, nasal congestion, post nasal drip,   CV:  No chest pain,  Orthopnea, PND, swelling in lower extremities, anasarca, dizziness, palpitations, syncope.   GI  + heartburn, indigestion, abdominal pain, nausea, vomiting, diarrhea, change in bowel habits, loss of appetite, bloody stools.   Resp: + shortness of breath with exertion or at rest.  + excess mucus, + productive cough,  + non-productive cough,  No coughing up of blood.  No change in color of mucus.  + wheezing.  No chest wall deformity  Skin: no rash or lesions.  GU: no dysuria, change in color of urine, no urgency or frequency.  No flank pain, no hematuria   MS:  No joint pain or swelling.  No decreased range of motion.  No back pain.  Psych:  + change in mood or affect. + depression and + anxiety.  No memory loss.   Vital Signs BP 120/70 (BP Location: Left Arm, Patient Position: Sitting, Cuff Size: Normal)   Pulse (!) 105   Ht 6\' 2"  (1.88 m)   Wt 174 lb 9.6 oz (79.2 kg)   SpO2 96%   BMI 22.42 kg/m    Physical Exam:  General- No distress,  A&Ox3, pleasant, very anxious ENT: No sinus tenderness, TM clear, pale nasal mucosa, no oral exudate,no post nasal drip, no LAN Cardiac: S1, S2, regular rate and rhythm, no murmur Chest: No wheeze/ + crackles bilateral bases/ + dullness; no accessory muscle use, no nasal flaring, no sternal retractions Abd.: Soft Non-tender, ND, BS +, Body mass index is 22.42 kg/m.  Ext: No clubbing cyanosis, edema Neuro:  normal strength, MAE x 4, A&O x 3 Skin: No rashes, No lesions , warm and dry Psych: Very anxious   Assessment/Plan Mass of upper lobe of left lung Infection vs Neoplasm in former smoker Treated with course of  omnicef 7/2>>7/8 ( received 7 days total treatment for CAP) Hospital ED visit 8/7 with CTA  showing progressive changes and New areas of bronchial narrowing  Plan -  We will schedule a bronchoscopy within the next week for tissue sampling and cultures.  -  We will get sputum Cultures today , with AFB and Fungal assessments -  Call for any changes   Weight Loss 15 pound weight loss over last 8 weeks ( same scale) BMI 22.42 Plan - Bronch with tissue sampling and  cultures - Ensure/ Boost meal supplements - Monitor weight - Consider Megace for appetite stimulation  Dyspnea, 2/2 ILD vs infectious process Saturations today on RA were 96% Plan - Will need PFT's, 6 minute walk and serial CT's for progression monitoring once lung mass is evaluated and treated  Interstitial lung disease ([place])   ILD packet completed Plan - Will need PFT's if bronch shows malignancy to see if patient is candidate for surgery as option for treatment. - Once current priority of assessing lung mass has been completed, consideration of antifibrotic therapy, will need 6 minute walk and HRCT on regular basis to assess for progression of disease   Pulmonary embolism (HCC) - Provoked PE, needs anticoagulation for 6-8 months  - Continue Eliquis 5 mg twice daily as you have been doing - Remember to utilize bleeding precautions  Restless Leg Syndrome x many years Ambien makes him extremely depressed Oxycodone and Vicodin make  him feel bad. Tramadol has helped in the past Was given Requip x 14 days in the ED, and states it is the first thing that has helped, and is sleeping for the first time in years Plan Will refill RX for Requip, as sleep will be important for the patient in coming days  I spent 50 minutes dedicated to the care of this patient on the date of this encounter to include pre-visit review of records, face-to-face time with the patient discussing conditions above, post visit ordering of testing, clinical documentation with the electronic health record, making appropriate referrals as  documented, and communicating necessary information to the patient's healthcare team.       Magdalen Spatz, NP 09/13/2020  9:48 AM

## 2020-09-12 NOTE — Progress Notes (Signed)
History of Present Illness Paul Roth is a 70 y.o. male former smoker quit 1990 (35-pack-year history). Smoked pot until 2 years ago.  Past medical history significant for NSTEMI, pulmonary embolism after shoulder surgery 07/2020, interstitial lung disease, and ? neoplasm vs infection of upper lobe left lung.  Patient of Dr. Shearon Stalls, seen in office on 07/17/2020 for sob consult.  Synopsis Recent hospital admission. He was admitted for acute pulmonary embolism and possible pneumonia on 08/04/20  after total shoulder arthroplasty in late June. He had an upper lobe mass on CT imaging, pulmonary felt this could possibly be due to pneumonia. He was treated with Omnicef and started on Eliquis 5 mg twice daily for the next 6 to 8 months.  Original plan was to CT chest early September, however patient developed dyspnea on 09/09/2020 and sought care in the ED.  ED work-up included CTA chest to evaluate for additional clot burden despite anticoagulation therapy.  CTA was negative for additional clot burden however showed some progressive changes to the left upper lobe area of concern.  ED MD discussed with the patient that she was very concerned that this was a neoplasm with a smoking history and recommended he have bronchoscopy as soon as possible for tissue diagnosis versus culture confirmation of infection.  Patient is highly anxious and wishes to have bronchoscopy as soon as possible.  09/13/2020 Pt. Presents for follow up. He was seen in the hospital over the weekend for dyspnea . A CTA Chest was done which shows Progressive increased airspace opacity in the left upper lobe posterolaterally as described with some increase in mediastinal and left hilar adenopathy again suspicious for neoplasm although aggressive infectious infiltrate could not be totally excluded. New areas of bronchial narrowing are seen when compared with the prior exam.  The original plan was to wait until September for follow up CT Chest,  however the patient is very anxious.His dyspnea is much worse. He would like to have the bronchoscopy now. The MD that he saw in the ED felt the patient needed bronch much sooner.  He denies fever, chest pain, or hemoptysis.  He does endorse weight loss over the last 2 months.  He endorses dyspnea that he feels has gotten worse over the last 2 months.   In looking over his visits to this office over the last 8 weeks, weight was 189 pounds on 07/17/20, 176 pounds on 08/28/2020, and 174 pounds on 09/12/2020.   This is a 15 pound weight loss over the last 2 months, on the same scale. .  Test Results: CTA Chest 09/09/2020 Mediastinum/Nodes: Thoracic inlet is within normal limits. Scattered AP window and prevascular lymphadenopathy is noted which has increased in size when compared with the prior exam. Dominant node measures approximately 2.0 cm in short axis and previously measured 1.6 cm in short axis. These have all progressed in size when compared with the prior exam. Central left hilar lymph nodes are seen which appear relatively stable in appearance measuring up to 19 mm in short axis. Subcarinal adenopathy is noted measuring 2.3 cm in short axis increased in size from the prior exam at which time it measured 2 cm in short axis. Few scattered small right hilar lymph nodes are seen although not significant by size criteria. The esophagus as visualized is within normal limits with the exception of a small sliding-type hiatal hernia.   Lungs/Pleura: The lungs are well aerated bilaterally. Persistent interstitial lung disease is noted as well as superimposed emphysematous  change. Diffuse honeycombing in the bases is noted. There is again noted a focal masslike density in the posterolateral aspect of the left upper lobe. This measures approximately 4.5 by 2.6 cm in greatest transverse and AP dimensions. It measures up to 5.7 cm in craniocaudad projection although evaluation is somewhat limited due  to the surrounding increase in airspace opacity. Some increasing associated airspace disease is noted likely related to some central obstructive changes and attenuation of the bronchial tree.   CBC Latest Ref Rng & Units 09/09/2020 08/08/2020 08/07/2020  WBC 4.0 - 10.5 K/uL 8.8 10.6(H) 10.2  Hemoglobin 13.0 - 17.0 g/dL 16.4 12.8(L) 12.9(L)  Hematocrit 39.0 - 52.0 % 48.2 37.7(L) 38.0(L)  Platelets 150 - 400 K/uL 275 289 263    BMP Latest Ref Rng & Units 09/09/2020 08/08/2020 08/05/2020  Glucose 70 - 99 mg/dL 113(H) 96 93  BUN 8 - 23 mg/dL 13 11 11   Creatinine 0.61 - 1.24 mg/dL 1.22 0.94 0.85  Sodium 135 - 145 mmol/L 134(L) 137 136  Potassium 3.5 - 5.1 mmol/L 4.1 3.9 3.7  Chloride 98 - 111 mmol/L 97(L) 103 101  CO2 22 - 32 mmol/L 27 24 26   Calcium 8.9 - 10.3 mg/dL 9.8 8.8(L) 8.7(L)    BNP No results found for: BNP  ProBNP No results found for: PROBNP  PFT No results found for: FEV1PRE, FEV1POST, FVCPRE, FVCPOST, TLC, DLCOUNC, PREFEV1FVCRT, PSTFEV1FVCRT  CT Angio Chest PE W and/or Wo Contrast  Addendum Date: 09/10/2020   ADDENDUM REPORT: 09/10/2020 00:41 ADDENDUM: 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 Recommend annual imaging followup by CTA or MRA. This recommendation follows 2010 ACCF/AHA/AATS/ACR/ASA/SCA/SCAI/SIR/STS/SVM Guidelines for the Diagnosis and Management of Patients with Thoracic Aortic Disease. Circulation. 2010; 121: H371-I967. Aortic aneurysm NOS (ICD10-I71.9) Electronically Signed   By: Inez Catalina M.D.   On: 09/10/2020 00:41   Result Date: 09/10/2020 CLINICAL DATA:  Shortness of breath and cardiac palpitations, history of pulmonary embolism EXAM: CT ANGIOGRAPHY CHEST WITH CONTRAST TECHNIQUE: Multidetector CT imaging of the chest was performed using the standard protocol during bolus administration of intravenous contrast. Multiplanar CT image reconstructions and MIPs were obtained to evaluate the vascular anatomy. CONTRAST:  40mL OMNIPAQUE IOHEXOL 350 MG/ML SOLN  COMPARISON:  08/04/2020 FINDINGS: Cardiovascular: Thoracic aorta and its branches show atherosclerotic calcifications. Mild dilatation of the ascending aorta is noted to 4.2 cm. No evidence of dissection is noted. The pulmonary artery is well visualized and demonstrates a normal branching pattern. The previously seen filling defects in the left upper lobe branches are not as well appreciated on today's exam although no central attenuation of the upper lobe pulmonary arterial branches is noted related to adjacent lymphadenopathy and progressive airspace opacity. No significant coronary calcifications are noted. Mediastinum/Nodes: Thoracic inlet is within normal limits. Scattered AP window and prevascular lymphadenopathy is noted which has increased in size when compared with the prior exam. Dominant node measures approximately 2.0 cm in short axis and previously measured 1.6 cm in short axis. These have all progressed in size when compared with the prior exam. Central left hilar lymph nodes are seen which appear relatively stable in appearance measuring up to 19 mm in short axis. Subcarinal adenopathy is noted measuring 2.3 cm in short axis increased in size from the prior exam at which time it measured 2 cm in short axis. Few scattered small right hilar lymph nodes are seen although not significant by size criteria. The esophagus as visualized is within normal limits with the exception of  a small sliding-type hiatal hernia. Lungs/Pleura: The lungs are well aerated bilaterally. Persistent interstitial lung disease is noted as well as superimposed emphysematous change. Diffuse honeycombing in the bases is noted. There is again noted a focal masslike density in the posterolateral aspect of the left upper lobe. This measures approximately 4.5 by 2.6 cm in greatest transverse and AP dimensions. It measures up to 5.7 cm in craniocaudad projection although evaluation is somewhat limited due to the surrounding increase in  airspace opacity. Some increasing associated airspace disease is noted likely related to some central obstructive changes and attenuation of the bronchial tree. Upper Abdomen: Visualized upper abdomen demonstrates scattered hypodensities within the liver again likely representing cysts. Renal cystic change on the right is noted stable in appearance from the prior exam. Right adrenal gland appears prominent when compared with the prior exam measuring up to 19 mm. This likely represents an adenoma although the possibility metastatic disease could not be totally excluded. Musculoskeletal: Degenerative changes of the thoracic spine are noted. No acute rib abnormality is seen. Review of the MIP images confirms the above findings. IMPRESSION: Progressive increased airspace opacity in the left upper lobe posterolaterally as described with some increase in mediastinal and left hilar adenopathy again suspicious for neoplasm although aggressive infectious infiltrate could not be totally excluded. New areas of bronchial narrowing are seen when compared with the prior exam. Bronchoscopic evaluation is recommended for further diagnostic purposes. Previously seen pulmonary artery emboli on the left are not visualized on today's exam. No new emboli are seen. There is however some increased attenuation and narrowing of the pulmonary arterial structures secondary to the hilar and parenchymal disease. Underlying interstitial lung disease with diffuse basilar honeycombing. Stable hypodensities within the liver and right kidney. These likely represent cysts. Prominent right adrenal gland increased when compared with the prior exam. This may simply represent an adenoma although metastatic disease could not be totally excluded given the findings in the chest. Aortic Atherosclerosis (ICD10-I70.0) and Emphysema (ICD10-J43.9). Electronically Signed: By: Inez Catalina M.D. On: 09/09/2020 21:21     Past medical hx Past Medical History:   Diagnosis Date   Arthritis    Dyspnea    Hyperlipidemia    Tinnitus      Social History   Tobacco Use   Smoking status: Former    Packs/day: 1.00    Years: 35.00    Pack years: 35.00    Types: Cigarettes    Quit date: 1990    Years since quitting: 32.6   Smokeless tobacco: Never  Vaping Use   Vaping Use: Never used  Substance Use Topics   Alcohol use: Yes    Comment: rare   Drug use: Not Currently    Types: Marijuana    Comment: hx of years ago    Mr.Lapenna reports that he quit smoking about 32 years ago. His smoking use included cigarettes. He has a 35.00 pack-year smoking history. He has never used smokeless tobacco. He reports current alcohol use. He reports that he does not currently use drugs after having used the following drugs: Marijuana.  Tobacco Cessation: Former tobacco smoker , Quit 1990 with a 35 pack year smoking history Quit smoking Mariajuana 2 years ago, 2020    Past surgical hx, Family hx, Social hx all reviewed.  Current Outpatient Medications on File Prior to Visit  Medication Sig   apixaban (ELIQUIS) 5 MG TABS tablet Take 1 tablet (5 mg total) by mouth 2 (two) times daily.   ascorbic acid (  VITAMIN C) 100 MG tablet Take 100 mg by mouth daily.   atorvastatin (LIPITOR) 10 MG tablet Take 10 mg by mouth daily.   MAGNESIUM CITRATE PO Take 1 Scoop by mouth daily.   nortriptyline (PAMELOR) 75 MG capsule Take 75 mg by mouth at bedtime.   pantoprazole (PROTONIX) 40 MG tablet Take 1 tablet (40 mg total) by mouth 2 (two) times daily before a meal.   RESVERATROL PO Take 1 capsule by mouth daily.   tiZANidine (ZANAFLEX) 2 MG tablet Take 1 tablet (2 mg total) by mouth every 8 (eight) hours as needed (as needed for muscle pain).   VITAMIN D, CHOLECALCIFEROL, PO Take 1 tablet by mouth daily.   No current facility-administered medications on file prior to visit.     No Known Allergies  Review Of Systems:  Constitutional:   + weight loss, No night sweats,   Fevers, chills, fatigue, or  lassitude.  HEENT:   No headaches,  Difficulty swallowing,  Tooth/dental problems, or  Sore throat,                No sneezing, itching, ear ache, nasal congestion, post nasal drip,   CV:  No chest pain,  Orthopnea, PND, swelling in lower extremities, anasarca, dizziness, palpitations, syncope.   GI  + heartburn, indigestion, abdominal pain, nausea, vomiting, diarrhea, change in bowel habits, loss of appetite, bloody stools.   Resp: + shortness of breath with exertion or at rest.  + excess mucus, + productive cough,  + non-productive cough,  No coughing up of blood.  No change in color of mucus.  + wheezing.  No chest wall deformity  Skin: no rash or lesions.  GU: no dysuria, change in color of urine, no urgency or frequency.  No flank pain, no hematuria   MS:  No joint pain or swelling.  No decreased range of motion.  No back pain.  Psych:  + change in mood or affect. + depression and + anxiety.  No memory loss.   Vital Signs BP 120/70 (BP Location: Left Arm, Patient Position: Sitting, Cuff Size: Normal)   Pulse (!) 105   Ht 6\' 2"  (1.88 m)   Wt 174 lb 9.6 oz (79.2 kg)   SpO2 96%   BMI 22.42 kg/m    Physical Exam:  General- No distress,  A&Ox3, pleasant, very anxious ENT: No sinus tenderness, TM clear, pale nasal mucosa, no oral exudate,no post nasal drip, no LAN Cardiac: S1, S2, regular rate and rhythm, no murmur Chest: No wheeze/ + crackles bilateral bases/ + dullness; no accessory muscle use, no nasal flaring, no sternal retractions Abd.: Soft Non-tender, ND, BS +, Body mass index is 22.42 kg/m.  Ext: No clubbing cyanosis, edema Neuro:  normal strength, MAE x 4, A&O x 3 Skin: No rashes, No lesions , warm and dry Psych: Very anxious   Assessment/Plan Mass of upper lobe of left lung Infection vs Neoplasm in former smoker Treated with course of  omnicef 7/2>>7/8 ( received 7 days total treatment for CAP) Hospital ED visit 8/7 with CTA  showing progressive changes and New areas of bronchial narrowing  Plan -  We will schedule a bronchoscopy within the next week for tissue sampling and cultures.  -  We will get sputum Cultures today , with AFB and Fungal assessments -  Call for any changes   Weight Loss 15 pound weight loss over last 8 weeks ( same scale) BMI 22.42 Plan - Bronch with tissue sampling and  cultures - Ensure/ Boost meal supplements - Monitor weight - Consider Megace for appetite stimulation  Dyspnea, 2/2 ILD vs infectious process Saturations today on RA were 96% Plan - Will need PFT's, 6 minute walk and serial CT's for progression monitoring once lung mass is evaluated and treated  Interstitial lung disease ([place])   ILD packet completed Plan - Will need PFT's if bronch shows malignancy to see if patient is candidate for surgery as option for treatment. - Once current priority of assessing lung mass has been completed, consideration of antifibrotic therapy, will need 6 minute walk and HRCT on regular basis to assess for progression of disease   Pulmonary embolism (HCC) - Provoked PE, needs anticoagulation for 6-8 months  - Continue Eliquis 5 mg twice daily as you have been doing - Remember to utilize bleeding precautions  Restless Leg Syndrome x many years Ambien makes him extremely depressed Oxycodone and Vicodin make  him feel bad. Tramadol has helped in the past Was given Requip x 14 days in the ED, and states it is the first thing that has helped, and is sleeping for the first time in years Plan Will refill RX for Requip, as sleep will be important for the patient in coming days  I spent 50 minutes dedicated to the care of this patient on the date of this encounter to include pre-visit review of records, face-to-face time with the patient discussing conditions above, post visit ordering of testing, clinical documentation with the electronic health record, making appropriate referrals as  documented, and communicating necessary information to the patient's healthcare team.       Magdalen Spatz, NP 09/13/2020  9:48 AM

## 2020-09-13 ENCOUNTER — Encounter: Payer: Self-pay | Admitting: Acute Care

## 2020-09-13 ENCOUNTER — Other Ambulatory Visit: Payer: Medicare Other

## 2020-09-13 DIAGNOSIS — G2581 Restless legs syndrome: Secondary | ICD-10-CM

## 2020-09-13 DIAGNOSIS — J189 Pneumonia, unspecified organism: Secondary | ICD-10-CM

## 2020-09-13 LAB — MYOMARKER 3 PLUS PROFILE (RDL)

## 2020-09-14 ENCOUNTER — Telehealth: Payer: Self-pay | Admitting: Internal Medicine

## 2020-09-14 DIAGNOSIS — R918 Other nonspecific abnormal finding of lung field: Secondary | ICD-10-CM

## 2020-09-14 DIAGNOSIS — R590 Localized enlarged lymph nodes: Secondary | ICD-10-CM

## 2020-09-14 NOTE — Telephone Encounter (Signed)
Called patient to discuss CT results and next steps. Left message for call back.

## 2020-09-14 NOTE — Telephone Encounter (Signed)
Called patient and discussed CT scan results. Concern for infection vs malignancy. Clinical situation complicated by recent PE and need to hold [facility] prior to bronchoscopy. Also with emphysema and fibrosis bronchoscopy is higher risk. After discussing risks/benefits, patient has decided to proceed with bronchoscopy, brushings, washings, and EBUS. He will hold the eliquis the day before procedure.

## 2020-09-15 LAB — CULTURE, BLOOD (ROUTINE X 2)
Culture: NO GROWTH
Culture: NO GROWTH
Special Requests: ADEQUATE
Special Requests: ADEQUATE

## 2020-09-17 NOTE — Telephone Encounter (Signed)
Need a order for the bronch

## 2020-09-17 NOTE — Telephone Encounter (Signed)
I have scheduled pt for 8/23 at 12:00 at [facility].  Pt to have covid test on 8/19.  I have left him a vm for him to call me back for appt info.

## 2020-09-18 NOTE — Telephone Encounter (Signed)
I called pt & left him another vm to call me for biopsy appt info.  I also need to let him know to go for covid test on 8/19.

## 2020-09-18 NOTE — Telephone Encounter (Signed)
Spoke to pt & gave him appt info.

## 2020-09-20 NOTE — Progress Notes (Signed)
Attempted to obtain medical history via telephone, unable to reach at this time. I left a voicemail to return pre surgical testing department's phone call.  

## 2020-09-21 ENCOUNTER — Other Ambulatory Visit: Payer: Self-pay

## 2020-09-24 ENCOUNTER — Encounter (HOSPITAL_COMMUNITY): Payer: Self-pay | Admitting: Internal Medicine

## 2020-09-24 ENCOUNTER — Other Ambulatory Visit: Payer: Self-pay

## 2020-09-24 LAB — SARS CORONAVIRUS 2 (TAT 6-24 HRS): SARS Coronavirus 2: NEGATIVE

## 2020-09-24 NOTE — Anesthesia Preprocedure Evaluation (Addendum)
Anesthesia Evaluation  Patient identified by MRN, date of birth, ID band Patient awake    Reviewed: Allergy & Precautions, NPO status , Patient's Chart, lab work & pertinent test results  History of Anesthesia Complications Negative for: history of anesthetic complications  Airway Mallampati: II  TM Distance: >3 FB Neck ROM: Full    Dental  (+) Teeth Intact, Poor Dentition, Dental Advisory Given   Pulmonary shortness of breath, COPD, former smoker, PE (after shoulder surgery 07/2020) ILD LUL mass   Pulmonary exam normal        Cardiovascular + Past MI  Normal cardiovascular exam     Neuro/Psych negative neurological ROS     GI/Hepatic negative GI ROS, Neg liver ROS,   Endo/Other  negative endocrine ROS  Renal/GU negative Renal ROS  negative genitourinary   Musculoskeletal  (+) Arthritis ,   Abdominal   Peds  Hematology Eliquis   Anesthesia Other Findings  Echo 08/06/20: EF 55-60%, g1dd, normal RV function, valves unremarkable, mod aortic root/ascending aorta dilatation (47 mm)  Reproductive/Obstetrics                            Anesthesia Physical Anesthesia Plan  ASA: 3  Anesthesia Plan: General   Post-op Pain Management:    Induction: Intravenous  PONV Risk Score and Plan: 2 and Ondansetron, Dexamethasone, Treatment may vary due to age or medical condition and Midazolam  Airway Management Planned: Oral ETT  Additional Equipment: None  Intra-op Plan:   Post-operative Plan: Extubation in OR  Informed Consent: I have reviewed the patients History and Physical, chart, labs and discussed the procedure including the risks, benefits and alternatives for the proposed anesthesia with the patient or authorized representative who has indicated his/her understanding and acceptance.     Dental advisory given  Plan Discussed with:   Anesthesia Plan Comments:        Anesthesia  Quick Evaluation

## 2020-09-25 ENCOUNTER — Encounter (HOSPITAL_COMMUNITY): Payer: Self-pay | Admitting: Internal Medicine

## 2020-09-25 ENCOUNTER — Ambulatory Visit (HOSPITAL_COMMUNITY): Payer: Medicare Other | Admitting: Anesthesiology

## 2020-09-25 ENCOUNTER — Ambulatory Visit (HOSPITAL_COMMUNITY)
Admission: RE | Admit: 2020-09-25 | Discharge: 2020-09-25 | Disposition: A | Discharge status: Home / Self Care | Payer: Medicare Other | Attending: Internal Medicine | Admitting: Internal Medicine

## 2020-09-25 ENCOUNTER — Other Ambulatory Visit: Payer: Self-pay

## 2020-09-25 ENCOUNTER — Ambulatory Visit (HOSPITAL_COMMUNITY): Payer: Medicare Other

## 2020-09-25 ENCOUNTER — Encounter (HOSPITAL_COMMUNITY)
Admission: RE | Disposition: A | Discharge status: Home / Self Care | Payer: Self-pay | Source: Home / Self Care | Attending: Internal Medicine

## 2020-09-25 DIAGNOSIS — J849 Interstitial pulmonary disease, unspecified: Secondary | ICD-10-CM | POA: Insufficient documentation

## 2020-09-25 DIAGNOSIS — C3412 Malignant neoplasm of upper lobe, left bronchus or lung: Secondary | ICD-10-CM | POA: Diagnosis not present

## 2020-09-25 DIAGNOSIS — Z87891 Personal history of nicotine dependence: Secondary | ICD-10-CM | POA: Insufficient documentation

## 2020-09-25 DIAGNOSIS — Z86711 Personal history of pulmonary embolism: Secondary | ICD-10-CM | POA: Diagnosis not present

## 2020-09-25 DIAGNOSIS — I509 Heart failure, unspecified: Secondary | ICD-10-CM

## 2020-09-25 DIAGNOSIS — C771 Secondary and unspecified malignant neoplasm of intrathoracic lymph nodes: Secondary | ICD-10-CM | POA: Insufficient documentation

## 2020-09-25 DIAGNOSIS — I252 Old myocardial infarction: Secondary | ICD-10-CM | POA: Diagnosis not present

## 2020-09-25 DIAGNOSIS — Z6821 Body mass index (BMI) 21.0-21.9, adult: Secondary | ICD-10-CM | POA: Insufficient documentation

## 2020-09-25 DIAGNOSIS — R590 Localized enlarged lymph nodes: Secondary | ICD-10-CM

## 2020-09-25 DIAGNOSIS — Z79899 Other long term (current) drug therapy: Secondary | ICD-10-CM | POA: Diagnosis not present

## 2020-09-25 DIAGNOSIS — R918 Other nonspecific abnormal finding of lung field: Secondary | ICD-10-CM

## 2020-09-25 DIAGNOSIS — R634 Abnormal weight loss: Secondary | ICD-10-CM | POA: Diagnosis not present

## 2020-09-25 HISTORY — PX: VIDEO BRONCHOSCOPY: SHX5072

## 2020-09-25 HISTORY — PX: BRONCHIAL BIOPSY: SHX5109

## 2020-09-25 HISTORY — PX: BRONCHIAL BRUSHINGS: SHX5108

## 2020-09-25 HISTORY — PX: BRONCHIAL WASHINGS: SHX5105

## 2020-09-25 HISTORY — PX: ENDOBRONCHIAL ULTRASOUND: SHX5096

## 2020-09-25 LAB — BODY FLUID CELL COUNT WITH DIFFERENTIAL
Eos, Fluid: 1 %
Lymphs, Fluid: 14 %
Monocyte-Macrophage-Serous Fluid: 69 % (ref 50–90)
Neutrophil Count, Fluid: 16 % (ref 0–25)
Total Nucleated Cell Count, Fluid: 583 cu mm (ref 0–1000)

## 2020-09-25 SURGERY — ENDOBRONCHIAL ULTRASOUND (EBUS)
Anesthesia: General

## 2020-09-25 MED ORDER — PROPOFOL 10 MG/ML IV BOLUS
INTRAVENOUS | Status: AC
  Filled 2020-09-25: qty 40

## 2020-09-25 MED ORDER — ROCURONIUM BROMIDE 10 MG/ML (PF) SYRINGE
PREFILLED_SYRINGE | INTRAVENOUS | Status: DC | PRN
  Administered 2020-09-25: 70 mg via INTRAVENOUS

## 2020-09-25 MED ORDER — PROPOFOL 10 MG/ML IV BOLUS
INTRAVENOUS | Status: DC | PRN
  Administered 2020-09-25: 140 mg via INTRAVENOUS

## 2020-09-25 MED ORDER — DEXAMETHASONE SODIUM PHOSPHATE 4 MG/ML IJ SOLN
INTRAMUSCULAR | Status: DC | PRN
  Administered 2020-09-25: 5 mg via INTRAVENOUS

## 2020-09-25 MED ORDER — MIDAZOLAM HCL 5 MG/5ML IJ SOLN
INTRAMUSCULAR | Status: DC | PRN
  Administered 2020-09-25: 2 mg via INTRAVENOUS

## 2020-09-25 MED ORDER — ONDANSETRON HCL 4 MG/2ML IJ SOLN
INTRAMUSCULAR | Status: DC | PRN
Start: 2020-09-25 — End: 2020-09-25
  Administered 2020-09-25: 4 mg via INTRAVENOUS

## 2020-09-25 MED ORDER — FENTANYL CITRATE (PF) 100 MCG/2ML IJ SOLN
INTRAMUSCULAR | Status: AC
  Filled 2020-09-25: qty 2

## 2020-09-25 MED ORDER — FENTANYL CITRATE (PF) 100 MCG/2ML IJ SOLN
INTRAMUSCULAR | Status: DC | PRN
  Administered 2020-09-25: 100 ug via INTRAVENOUS

## 2020-09-25 MED ORDER — MIDAZOLAM HCL 2 MG/2ML IJ SOLN
INTRAMUSCULAR | Status: AC
  Filled 2020-09-25: qty 2

## 2020-09-25 MED ORDER — PHENYLEPHRINE 40 MCG/ML (10ML) SYRINGE FOR IV PUSH (FOR BLOOD PRESSURE SUPPORT)
PREFILLED_SYRINGE | INTRAVENOUS | Status: DC | PRN
  Administered 2020-09-25 (×3): 80 ug via INTRAVENOUS

## 2020-09-25 MED ORDER — LIDOCAINE HCL 1 % IJ SOLN
INTRAMUSCULAR | Status: AC
  Filled 2020-09-25: qty 20

## 2020-09-25 MED ORDER — LACTATED RINGERS IV SOLN: INTRAVENOUS | Status: DC | PRN

## 2020-09-25 MED ORDER — SUGAMMADEX SODIUM 200 MG/2ML IV SOLN
INTRAVENOUS | Status: DC | PRN
  Administered 2020-09-25: 200 mg via INTRAVENOUS

## 2020-09-25 MED ORDER — LIDOCAINE 2% (20 MG/ML) 5 ML SYRINGE
INTRAMUSCULAR | Status: DC | PRN
  Administered 2020-09-25: 100 mg via INTRAVENOUS

## 2020-09-25 NOTE — Anesthesia Postprocedure Evaluation (Signed)
Anesthesia Post Note  Patient: Paul Roth  Procedure(s) Performed: ENDOBRONCHIAL ULTRASOUND VIDEO BRONCHOSCOPY WITHOUT FLUORO BRONCHIAL BIOPSIES BRONCHIAL BRUSHINGS BRONCHIAL WASHINGS     Patient location during evaluation: Endoscopy Anesthesia Type: General Level of consciousness: awake and alert Pain management: pain level controlled Vital Signs Assessment: post-procedure vital signs reviewed and stable Respiratory status: spontaneous breathing, nonlabored ventilation and respiratory function stable Cardiovascular status: blood pressure returned to baseline and stable Postop Assessment: no apparent nausea or vomiting Anesthetic complications: no   No notable events documented.  Last Vitals:  Vitals:   09/25/20 1040 09/25/20 1056  BP: 126/76 123/77  Pulse: 77 83  Resp: (!) 29 (!) 23  Temp: 36.5 C   SpO2: 99% 93%    Last Pain:  Vitals:   09/25/20 1056  TempSrc:   PainSc: 3                  Lidia Collum

## 2020-09-25 NOTE — Interval H&P Note (Signed)
History and Physical Interval Note:  09/25/2020 7:24 AM  Paul Roth  has presented today for surgery, with the diagnosis of pulm.  The various methods of treatment have been discussed with the patient and family. After consideration of risks, benefits and other options for treatment, the patient has consented to  Procedure(s): ENDOBRONCHIAL ULTRASOUND (N/A) VIDEO BRONCHOSCOPY WITHOUT FLUORO (N/A) as a surgical intervention.  The patient's history has been reviewed, patient examined, no change in status, stable for surgery.  I have reviewed the patient's chart and labs.  Questions were answered to the patient's satisfaction.     [name]

## 2020-09-25 NOTE — Transfer of Care (Signed)
Immediate Anesthesia Transfer of Care Note  Patient: Paul Roth  Procedure(s) Performed: ENDOBRONCHIAL ULTRASOUND VIDEO BRONCHOSCOPY WITHOUT FLUORO BRONCHIAL BIOPSIES BRONCHIAL BRUSHINGS BRONCHIAL WASHINGS  Patient Location: PACU  Anesthesia Type:General  Level of Consciousness: awake  Airway & Oxygen Therapy: Patient Spontanous Breathing and Patient connected to face mask oxygen  Post-op Assessment: Report given to RN and Post -op Vital signs reviewed and stable  Post vital signs: Reviewed and stable  Last Vitals:  Vitals Value Taken Time  BP    Temp    Pulse    Resp    SpO2      Last Pain:  Vitals:   09/25/20 0808  TempSrc: Oral  PainSc: 3       Patients Stated Pain Goal: 0 (52/71/29 2909)  Complications: No notable events documented.

## 2020-09-25 NOTE — Discharge Instructions (Signed)
Broncoscopy  What can I expect after the procedure? Your blood pressure, heart rate, breathing rate, and blood oxygen level will be monitored until you leave the hospital or clinic. You may have a chest X-ray to check for signs of pneumothorax. You willnot be allowed to eat or drink anything for 2 hours after your procedure. If a biopsy was taken, it is up to you to get the results of the test. Ask your health care provider, or the department that is doing the procedure, when your results will be ready. You may have the following symptoms for 24-48 hours: A cough that is worse than it was before the procedure. A low-grade fever. A sore throat or hoarse voice. Some blood in the mucus from your lungs (sputum), if a biopsy was done. Follow these instructions at home: Eating and drinking Do not eat or drink anything, including water, for 2 hours after your procedure, or until your numbing medicine has worn off. Having a numb throat increases your risk of burning yourself or choking. Start eating soft foods and slowly drinking liquids after your numbness is gone and your cough and gag reflexes have returned. You may return to your normal diet the day after the procedure. Driving If you were given a sedative during the procedure, it can affect you for several hours. Do not drive or operate machinery until your health care provider says that it is safe. Ask your health care provider if the medicine prescribed to you requires you to avoid driving or using machinery. Return to your normal activities as told by your health care provider. Ask your health care provider what activities are safe for you. General instructions  Take over-the-counter and prescription medicines only as told by your health care provider. Do not use any products that contain nicotine or tobacco. These products include cigarettes, chewing tobacco, and vaping devices, such as e-cigarettes. If you need help quitting, ask your  health care provider. Keep all follow-up visits. This is important.   Get help right away if: You have shortness of breath that gets worse. You become light-headed or feel like you might faint. You have chest pain. You cough up more than a small amount of blood. These symptoms may represent a serious problem that is an emergency. Do not wait to see if the symptoms will go away. Get medical help right away. Call your local emergency services (911 in the [place] Do not drive yourself to the hospital. Summary Flexible bronchoscopy is a procedure that allows your health care provider to look closely inside your lungs and to take testing samples if needed. Risks of flexible bronchoscopy include bleeding, infection, and collapsed lung (pneumothorax). Before the procedure, you will be given a medicine to numb your mouth, nose, throat, and voice box. Then, a bronchoscope will be passed into your nose or mouth, and into your lungs. After the procedure, your blood pressure, heart rate, breathing rate, and blood oxygen level will be monitored until you leave the hospital or clinic. You may have a chest X-ray to check for signs of pneumothorax. You will not be allowed to eat or drink anything for 2 hours after your procedure. This information is not intended to replace advice given to you by your health care provider. Make sure you discuss any questions you have with your healthcare provider.

## 2020-09-25 NOTE — Anesthesia Procedure Notes (Signed)
Procedure Name: Intubation Date/Time: 09/25/2020 9:25 AM Performed by: Lieutenant Diego, CRNA Pre-anesthesia Checklist: Patient identified, Emergency Drugs available, Suction available and Patient being monitored Patient Re-evaluated:Patient Re-evaluated prior to induction Oxygen Delivery Method: Circle system utilized Preoxygenation: Pre-oxygenation with 100% oxygen Induction Type: IV induction Ventilation: Mask ventilation without difficulty Laryngoscope Size: Miller and 2 Grade View: Grade I Tube type: Oral Tube size: 8.5 mm Number of attempts: 1 Airway Equipment and Method: Stylet Placement Confirmation: ETT inserted through vocal cords under direct vision, positive ETCO2 and breath sounds checked- equal and bilateral Secured at: 24 cm Tube secured with: Tape Dental Injury: Teeth and Oropharynx as per pre-operative assessment

## 2020-09-25 NOTE — Op Note (Signed)
[facility] Cardiopulmonary Patient Name: Paul Roth Procedure Date: 09/25/2020 MRN: [id number] Attending MD: Senaida Ores. Shearon Stalls MD, MD Date of Birth: 1950-10-03 CSN: [id number] Age: 70 Admit Type: Outpatient Ethnicity: Not Hispanic or Latino Procedure:             Bronchoscopy Indications:           Left upper lobe mass Providers:             Senaida Ores. Shearon Stalls MD, MD, Clyde Lundborg, RN, Tyna Jaksch Technician Referring MD:           Medicines:             See the Anesthesia note for documentation of the                         administered medications Complications:         No immediate complications Estimated Blood Loss:  Estimated blood loss was minimal. Procedure:      Pre-Anesthesia Assessment:      - A History and Physical has been performed. Patient meds and allergies       have been reviewed. The risks and benefits of the procedure and the       sedation options and risks were discussed with the patient. All       questions were answered and informed consent was obtained. Patient       identification and proposed procedure were verified prior to the       procedure by the physician in the pre-procedure area. Mental Status       Examination: alert and oriented. Airway Examination: normal       oropharyngeal airway. Respiratory Examination: poor air movement. CV       Examination: regular rate and rhythm. ASA Grade Assessment: III - A       patient with severe systemic disease. After reviewing the risks and       benefits, the patient was deemed in satisfactory condition to undergo       the procedure. The anesthesia plan was to use general anesthesia.       Immediately prior to administration of medications, the patient was       re-assessed for adequacy to receive sedatives. The heart rate,       respiratory rate, oxygen saturations, blood pressure, adequacy of       pulmonary ventilation, and response to care were monitored  throughout       the procedure. The physical status of the patient was re-assessed after       the procedure.      After obtaining informed consent, the bronchoscope was passed under       direct vision. Throughout the procedure, the patient's blood pressure,       pulse, and oxygen saturations were monitored continuously. the BF-H190       ([id number]) Olympus bronchoscope was introduced through the mouth, via the       endotracheal tube (the patient was intubated for the procedure) and       advanced to the tracheobronchial tree of both lungs. the BF-UC190F       ([id number]) Olympus Ebus scope was introduced through the ETT and advanced       to the entire tracheobronchial tree. Fine needle aspiration performed  from Station 7 Lymph Node. Next diagnostic scope was inserted and       bronchial brushings and BAL were taken from the LUL. The patient       tolerated the procedure well. Findings:      The endotracheal tube is in good position. The visualized portion of the       trachea is of normal caliber. The carina is sharp. The tracheobronchial       tree was examined to at least the first subsegmental level. the right       lung was normal with mild bronchial secretions. The left lung was also       normal anatomy with trace bronchial secretions. No endobronchial       lesions. BAL and bronchial brushings taken form LUL. EBUS TBNA performed       from Station 7 lymph node. Impression:      - Left upper lobe mass      - Hilar adenopathy      - BAL sent with cell count, cytology, gram stain, culture, fungal, AFB.       Bronchial brushings sent for cytology. EBUS TBNA sent for surgical       pathology.      - The airway examination was normal. Moderate Sedation:      see anesthesia Recommendation:      - Await test results. Procedure Code(s):      --- Professional ---      [phone], Bronchoscopy, rigid or flexible, including fluoroscopic guidance,       when performed; diagnostic,  with cell washing, when performed (separate       procedure) Diagnosis Code(s):      --- Professional ---      R91.8, Other nonspecific abnormal finding of lung field CPT copyright 2019 American Medical Association. All rights reserved. The codes documented in this report are preliminary and upon coder review may  be revised to meet current compliance requirements. Jameriah Trotti S. Shearon Stalls MD, MD 09/25/2020 10:34:11 AM This report has been signed electronically. Number of Addenda: 0 Scope In: Scope Out:

## 2020-09-26 ENCOUNTER — Encounter (HOSPITAL_COMMUNITY): Payer: Self-pay | Admitting: Internal Medicine

## 2020-09-26 LAB — ACID FAST SMEAR (AFB, MYCOBACTERIA): Acid Fast Smear: NEGATIVE

## 2020-09-28 LAB — BODY FLUID CULTURE W GRAM STAIN: Culture: NORMAL

## 2020-09-28 LAB — CYTOLOGY - NON PAP

## 2020-09-28 NOTE — Telephone Encounter (Signed)
Hello Dr. Shearon Stalls, please see mychart message below, thanks!  Question regarding Culture, Fluid; Hi, just wondering what this means. No rush. Can wait until next week. Thanks Truman Hayward

## 2020-10-01 ENCOUNTER — Telehealth: Payer: Self-pay | Admitting: *Deleted

## 2020-10-01 DIAGNOSIS — R918 Other nonspecific abnormal finding of lung field: Secondary | ICD-10-CM

## 2020-10-01 NOTE — Telephone Encounter (Signed)
I received referral on Paul Roth today. I called him with an appt to be seen this week at [facility]. He verbalized understanding of appt.

## 2020-10-02 ENCOUNTER — Encounter: Payer: Self-pay | Admitting: *Deleted

## 2020-10-02 NOTE — Progress Notes (Signed)
Per Dr. Julien Nordmann, tissue was sent for molecular and PDL 1 testing.

## 2020-10-03 ENCOUNTER — Telehealth: Payer: Self-pay | Admitting: Internal Medicine

## 2020-10-03 NOTE — Telephone Encounter (Signed)
I called and spoke with patient regarding message. Patient was wanting to see if HRCT scheduled for 09/06 was necessary as had CT angio earlier this month. I explained they the CT angio was looking at the Blood clot in the lung and HRCT will show them more. They are two different scans. Patient verbalized understanding, nothing further needed.

## 2020-10-03 NOTE — Telephone Encounter (Signed)
ATC Patient.  LM to call back. 

## 2020-10-03 NOTE — Telephone Encounter (Signed)
Pt has had a ct angio in 09/2020

## 2020-10-04 ENCOUNTER — Ambulatory Visit: Payer: Medicare Other

## 2020-10-04 ENCOUNTER — Other Ambulatory Visit: Payer: Self-pay

## 2020-10-04 ENCOUNTER — Encounter: Payer: Self-pay | Admitting: Emergency Medicine

## 2020-10-04 ENCOUNTER — Inpatient Hospital Stay: Payer: Medicare Other | Attending: Internal Medicine | Admitting: Internal Medicine

## 2020-10-04 ENCOUNTER — Encounter: Payer: Self-pay | Admitting: Internal Medicine

## 2020-10-04 ENCOUNTER — Inpatient Hospital Stay: Payer: Medicare Other

## 2020-10-04 ENCOUNTER — Other Ambulatory Visit: Payer: Self-pay | Admitting: *Deleted

## 2020-10-04 VITALS — BP 122/80 | HR 93 | Temp 97.1°F | Resp 26 | Ht 74.0 in | Wt 173.6 lb

## 2020-10-04 DIAGNOSIS — Z87891 Personal history of nicotine dependence: Secondary | ICD-10-CM | POA: Diagnosis not present

## 2020-10-04 DIAGNOSIS — C7951 Secondary malignant neoplasm of bone: Secondary | ICD-10-CM | POA: Diagnosis not present

## 2020-10-04 DIAGNOSIS — C7971 Secondary malignant neoplasm of right adrenal gland: Secondary | ICD-10-CM | POA: Diagnosis not present

## 2020-10-04 DIAGNOSIS — C3490 Malignant neoplasm of unspecified part of unspecified bronchus or lung: Secondary | ICD-10-CM

## 2020-10-04 DIAGNOSIS — R599 Enlarged lymph nodes, unspecified: Secondary | ICD-10-CM | POA: Insufficient documentation

## 2020-10-04 DIAGNOSIS — C3412 Malignant neoplasm of upper lobe, left bronchus or lung: Secondary | ICD-10-CM | POA: Insufficient documentation

## 2020-10-04 DIAGNOSIS — C787 Secondary malignant neoplasm of liver and intrahepatic bile duct: Secondary | ICD-10-CM | POA: Insufficient documentation

## 2020-10-04 DIAGNOSIS — Z9981 Dependence on supplemental oxygen: Secondary | ICD-10-CM | POA: Diagnosis not present

## 2020-10-04 DIAGNOSIS — R918 Other nonspecific abnormal finding of lung field: Secondary | ICD-10-CM

## 2020-10-04 DIAGNOSIS — C7972 Secondary malignant neoplasm of left adrenal gland: Secondary | ICD-10-CM | POA: Diagnosis not present

## 2020-10-04 DIAGNOSIS — C7989 Secondary malignant neoplasm of other specified sites: Secondary | ICD-10-CM | POA: Diagnosis not present

## 2020-10-04 DIAGNOSIS — C3492 Malignant neoplasm of unspecified part of left bronchus or lung: Secondary | ICD-10-CM | POA: Insufficient documentation

## 2020-10-04 DIAGNOSIS — R634 Abnormal weight loss: Secondary | ICD-10-CM | POA: Diagnosis not present

## 2020-10-04 DIAGNOSIS — M25511 Pain in right shoulder: Secondary | ICD-10-CM | POA: Insufficient documentation

## 2020-10-04 LAB — CMP (CANCER CENTER ONLY)
ALT: 13 U/L (ref 0–44)
AST: 14 U/L — ABNORMAL LOW (ref 15–41)
Albumin: 3.1 g/dL — ABNORMAL LOW (ref 3.5–5.0)
Alkaline Phosphatase: 91 U/L (ref 38–126)
Anion gap: 11 (ref 5–15)
BUN: 9 mg/dL (ref 8–23)
CO2: 25 mmol/L (ref 22–32)
Calcium: 9.4 mg/dL (ref 8.9–10.3)
Chloride: 100 mmol/L (ref 98–111)
Creatinine: 0.91 mg/dL (ref 0.61–1.24)
GFR, Estimated: >60 mL/min (ref >60)
Glucose, Bld: 86 mg/dL (ref 70–99)
Potassium: 4.2 mmol/L (ref 3.5–5.1)
Sodium: 136 mmol/L (ref 135–145)
Total Bilirubin: 0.5 mg/dL (ref 0.3–1.2)
Total Protein: 6.8 g/dL (ref 6.5–8.1)

## 2020-10-04 LAB — CBC WITH DIFFERENTIAL (CANCER CENTER ONLY)
Abs Immature Granulocytes: 0.04 10*3/uL (ref 0.00–0.07)
Basophils Absolute: 0.1 10*3/uL (ref 0.0–0.1)
Basophils Relative: 1 %
Eosinophils Absolute: 0.4 10*3/uL (ref 0.0–0.5)
Eosinophils Relative: 5 %
HCT: 43 % (ref 39.0–52.0)
Hemoglobin: 14.5 g/dL (ref 13.0–17.0)
Immature Granulocytes: 0 %
Lymphocytes Relative: 17 %
Lymphs Abs: 1.6 10*3/uL (ref 0.7–4.0)
MCH: 30.6 pg (ref 26.0–34.0)
MCHC: 33.7 g/dL (ref 30.0–36.0)
MCV: 90.7 fL (ref 80.0–100.0)
Monocytes Absolute: 1 10*3/uL (ref 0.1–1.0)
Monocytes Relative: 11 %
Neutro Abs: 6 10*3/uL (ref 1.7–7.7)
Neutrophils Relative %: 66 %
Platelet Count: 238 10*3/uL (ref 150–400)
RBC: 4.74 MIL/uL (ref 4.22–5.81)
RDW: 13.1 % (ref 11.5–15.5)
WBC Count: 9.1 10*3/uL (ref 4.0–10.5)
nRBC: 0 % (ref 0.0–0.2)

## 2020-10-04 NOTE — Research (Signed)
[place] [phone] - Customer service manager for [employer] and Validation of Biomarkers for the Prediction, Diagnosis, and Management of Disease  Patient Paul Roth was identified by this clinical Research officer, political party as a potential candidate for the above listed study.  This Clinical Research Coordinator met with Domnique Vantine, [id number], on 10/04/20 in a manner and location that ensures patient privacy to discuss participation in the above listed research study.  Patient is Accompanied by his wife .  A copy of the informed consent document and separate HIPAA Authorization was provided to the patient.  Patient reads, speaks, and understands [place].    Patient was provided with the business card of this Coordinator and encouraged to contact the research team with any questions.  Approximately 10 minutes were spent with the patient reviewing the informed consent documents.  Patient was provided the option of taking informed consent documents home to review and was encouraged to review at their convenience with their support network, including other care providers. Patient took the consent documents home to review.  Will follow up with patient via phone prior to next appointment to determine interest in participating and to plan next steps.  Clabe Seal Clinical Research Coordinator I  10/04/20 3:49 PM

## 2020-10-04 NOTE — Progress Notes (Signed)
The proposed treatment discussed in cancer conference is for discussion purpose only and is not a binding recommendation. The patient was not physically examined nor present for their treatment options. Therefore, final treatment plans cannot be decided.  ?

## 2020-10-04 NOTE — Progress Notes (Signed)
[place] [phone]   [phone] Multidisciplinary thoracic oncology clinic  CONSULT NOTE  REFERRING PHYSICIAN: Dr. Lenice Llamas  REASON FOR CONSULTATION:  70 years old white male recently diagnosed with lung cancer  HPI Paul Roth is a 70 y.o. male with past medical history significant for dyslipidemia, osteoarthritis and history of smoking but quit in 1988/03/30.  The patient was admitted to the hospital on August 04, 2020 complaining of shortness of breath and imaging studies at that time including CT angiogram scan of the chest showed small intraluminal filling defects in the left central pulmonary arteries extending into the segmental branches consistent with small pulmonary emboli.  There was masslike opacity in the periphery of the left upper lobe measuring 4.4 cm suspicious for neoplasm versus infection.  There was also mediastinal and left hilar adenopathy and small left pleural effusion.  There was some nonspecific hypodensities in the liver the largest in the left lobe measuring 1.7 cm.  The patient continues to complain of shortness of breath as well as cardiac palpitation.  He had repeat CT angiogram of the chest on 09/09/2020.  It showed progressive increased airspace opacity in the left upper lobe posterior laterally with some increasing mediastinal and left hilar adenopathy suspicious for neoplasm but aggressive infectious infiltrate could not be totally excluded.  There was new areas of bronchial narrowing seen when compared to the previous exam.  The previously seen pulmonary artery emboli on the previous scan was not visualized and no new pulmonary embolism.  There was also underlying interstitial lung disease with diffuse basilar honeycombing and a stable opacities within the liver and right kidneys likely cysts.  There is also prominent right adrenal gland increased when compared with the prior exam. On September 25, 2020 the patient underwent  video bronchoscopy under the care of Dr. Shearon Stalls and the final pathology ([id number]) showed malignant cells consistent with metastatic adenocarcinoma.  [facility] studies. The patient was referred to me today for evaluation and recommendation regarding treatment of his condition. When seen today he is feeling fine except for the baseline shortness of breath increased with minimal exertion as well as cough productive of greenish and occasionally clear sputum.  He denied having any chest pain or hemoptysis.  He denied having any fever or chills.  He lost around 15 pounds in the last 2 months.  He has no nausea, vomiting but has constipation when he was on treatment with oxycodone.  He denied having any headache or visual changes. Family history significant for mother and maternal aunt with pneumothoraxes.  Father died after a fall. The patient is married and has no children.  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  He was accompanied today by his wife Lelon Frohlich.  The patient has a history of smoking more than 1 pack/day for around 20 years but quit in 1985-03-30 after the death of his mother.  He also smoked marijuana for several years after quitting smoking.  He has no history of alcohol or drug abuse.  HPI  Past Medical History:  Diagnosis Date   Arthritis    Dyspnea    Hyperlipidemia    Tinnitus     Past Surgical History:  Procedure Laterality Date   BRONCHIAL BIOPSY  09/25/2020   Procedure: BRONCHIAL BIOPSIES;  Surgeon: [name], MD;  Location: [facility];  Service: Pulmonary;;   BRONCHIAL BRUSHINGS  09/25/2020   Procedure: BRONCHIAL BRUSHINGS;  Surgeon: [name], MD;  Location: [facility];  Service: Pulmonary;;   BRONCHIAL WASHINGS  09/25/2020   Procedure: BRONCHIAL WASHINGS;  Surgeon: [name], MD;  Location: [facility];  Service: Pulmonary;;   ENDOBRONCHIAL  ULTRASOUND N/A 09/25/2020   Procedure: ENDOBRONCHIAL ULTRASOUND;  Surgeon: [name], MD;  Location: [facility];  Service: Pulmonary;  Laterality: N/A;   EYE SURGERY     TOTAL SHOULDER ARTHROPLASTY Right 07/26/2020   Procedure: TOTAL SHOULDER ARTHROPLASTY;  Surgeon: Tania Ade, MD;  Location: [facility];  Service: Orthopedics;  Laterality: Right;   VIDEO BRONCHOSCOPY N/A 09/25/2020   Procedure: VIDEO BRONCHOSCOPY WITHOUT FLUORO;  Surgeon: [name], MD;  Location: [facility];  Service: Pulmonary;  Laterality: N/A;    Family History  Problem Relation Age of Onset   Lung disease Mother    Lung disease Maternal Aunt     Social History Social History   Tobacco Use   Smoking status: Former    Packs/day: 1.00    Years: 35.00    Pack years: 35.00    Types: Cigarettes    Quit date: 1990    Years since quitting: 32.6   Smokeless tobacco: Never  Vaping Use   Vaping Use: Never used  Substance Use Topics   Alcohol use: Yes    Comment: rare   Drug use: Not Currently    Types: Marijuana    Comment: hx of years ago    No Known Allergies  Current Outpatient Medications  Medication Sig Dispense Refill   albuterol (VENTOLIN HFA) 108 (90 Base) MCG/ACT inhaler Inhale 2 puffs into the lungs every 6 (six) hours as needed for wheezing or shortness of breath. 8 g 6   apixaban (ELIQUIS) 5 MG TABS tablet Take 1 tablet (5 mg total) by mouth 2 (two) times daily. 60 tablet 5   Ascorbic Acid (VITAMIN C) 1000 MG tablet Take 1,000 mg by mouth daily.     atorvastatin (LIPITOR) 10 MG tablet Take 10 mg by mouth daily.     Cholecalciferol (DIALYVITE VITAMIN D 5000) 125 MCG (5000 UT) capsule Take 5,000 Units by mouth daily.     fluticasone (FLONASE) 50 MCG/ACT nasal spray Place 2 sprays into both nostrils daily.     MAGNESIUM PO Take 175 mg by mouth daily.     nortriptyline (PAMELOR) 75 MG capsule Take 75 mg by mouth at bedtime.     oxyCODONE-acetaminophen (PERCOCET/ROXICET) 5-325 MG  tablet Take 0.5 tablets by mouth every 6 (six) hours as needed for moderate pain.     pantoprazole (PROTONIX) 40 MG tablet Take 1 tablet (40 mg total) by mouth 2 (two) times daily before a meal. 60 tablet 2   RESVERATROL PO Take 1 capsule by mouth daily.     rOPINIRole (REQUIP) 2 MG tablet Take 1 tablet (2 mg total) by mouth at bedtime. 30 tablet 1   tiZANidine (ZANAFLEX) 2 MG tablet Take 1 tablet (2 mg total) by mouth every 8 (eight) hours as needed (as needed for muscle pain). (Patient not taking: Reported on 09/18/2020) 30 tablet 0   trolamine salicylate (ASPERCREME) 10 % cream Apply 1 application topically as needed for muscle pain.     No current facility-administered medications for this visit.    Review of Systems  Constitutional: positive for fatigue and weight loss Eyes: negative Ears, nose, mouth, throat, and face: negative Respiratory: positive for cough, dyspnea on exertion, and sputum Cardiovascular: negative Gastrointestinal:  negative Genitourinary:negative Integument/breast: negative Hematologic/lymphatic: negative Musculoskeletal:negative Neurological: negative Behavioral/Psych: negative Endocrine: negative Allergic/Immunologic: negative  Physical Exam  KZS:WFUXN, healthy, no distress, well nourished, well developed, and anxious SKIN: skin color, texture, turgor are normal, no rashes or significant lesions HEAD: Normocephalic, No masses, lesions, tenderness or abnormalities EYES: normal, PERRLA, Conjunctiva are pink and non-injected EARS: External ears normal, Canals clear OROPHARYNX:no exudate, no erythema, and lips, buccal mucosa, and tongue normal  NECK: supple, no adenopathy, no JVD LYMPH:  no palpable lymphadenopathy, no hepatosplenomegaly LUNGS: clear to auscultation , and palpation HEART: regular rate & rhythm, no murmurs, and no gallops ABDOMEN:abdomen soft, non-tender, normal bowel sounds, and no masses or organomegaly BACK: No CVA tenderness, Range of  motion is normal EXTREMITIES:no joint deformities, effusion, or inflammation, no edema  NEURO: alert & oriented x 3 with fluent speech, no focal motor/sensory deficits  PERFORMANCE STATUS: ECOG 1  LABORATORY DATA: Lab Results  Component Value Date   WBC 9.1 10/04/2020   HGB 14.5 10/04/2020   HCT 43.0 10/04/2020   MCV 90.7 10/04/2020   PLT 238 10/04/2020      Chemistry      Component Value Date/Time   NA 134 (L) 09/09/2020 1856   K 4.1 09/09/2020 1856   CL 97 (L) 09/09/2020 1856   CO2 27 09/09/2020 1856   BUN 13 09/09/2020 1856   CREATININE 1.22 09/09/2020 1856      Component Value Date/Time   CALCIUM 9.8 09/09/2020 1856   ALKPHOS 84 09/09/2020 1856   AST 19 09/09/2020 1856   ALT 22 09/09/2020 1856   BILITOT 0.8 09/09/2020 1856       RADIOGRAPHIC STUDIES: CT Angio Chest PE W and/or Wo Contrast  Addendum Date: 09/10/2020   ADDENDUM REPORT: 09/10/2020 00:41 ADDENDUM: 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 Recommend annual imaging followup by CTA or MRA. This recommendation follows 2010 ACCF/AHA/AATS/ACR/ASA/SCA/SCAI/SIR/STS/SVM Guidelines for the Diagnosis and Management of Patients with Thoracic Aortic Disease. Circulation. 2010; 121: A355-D322. Aortic aneurysm NOS (ICD10-I71.9) Electronically Signed   By: Inez Catalina M.D.   On: 09/10/2020 00:41   Result Date: 09/10/2020 CLINICAL DATA:  Shortness of breath and cardiac palpitations, history of pulmonary embolism EXAM: CT ANGIOGRAPHY CHEST WITH CONTRAST TECHNIQUE: Multidetector CT imaging of the chest was performed using the standard protocol during bolus administration of intravenous contrast. Multiplanar CT image reconstructions and MIPs were obtained to evaluate the vascular anatomy. CONTRAST:  68m OMNIPAQUE IOHEXOL 350 MG/ML SOLN COMPARISON:  08/04/2020 FINDINGS: Cardiovascular: Thoracic aorta and its branches show atherosclerotic calcifications. Mild dilatation of the ascending aorta is noted to 4.2 cm. No evidence of  dissection is noted. The pulmonary artery is well visualized and demonstrates a normal branching pattern. The previously seen filling defects in the left upper lobe branches are not as well appreciated on today's exam although no central attenuation of the upper lobe pulmonary arterial branches is noted related to adjacent lymphadenopathy and progressive airspace opacity. No significant coronary calcifications are noted. Mediastinum/Nodes: Thoracic inlet is within normal limits. Scattered AP window and prevascular lymphadenopathy is noted which has increased in size when compared with the prior exam. Dominant node measures approximately 2.0 cm in short axis and previously measured 1.6 cm in short axis. These have all progressed in size when compared with the prior exam. Central left hilar lymph nodes are seen which appear relatively stable in appearance measuring up to 19 mm in short axis. Subcarinal adenopathy is noted measuring 2.3 cm in short axis increased in  size from the prior exam at which time it measured 2 cm in short axis. Few scattered small right hilar lymph nodes are seen although not significant by size criteria. The esophagus as visualized is within normal limits with the exception of a small sliding-type hiatal hernia. Lungs/Pleura: The lungs are well aerated bilaterally. Persistent interstitial lung disease is noted as well as superimposed emphysematous change. Diffuse honeycombing in the bases is noted. There is again noted a focal masslike density in the posterolateral aspect of the left upper lobe. This measures approximately 4.5 by 2.6 cm in greatest transverse and AP dimensions. It measures up to 5.7 cm in craniocaudad projection although evaluation is somewhat limited due to the surrounding increase in airspace opacity. Some increasing associated airspace disease is noted likely related to some central obstructive changes and attenuation of the bronchial tree. Upper Abdomen: Visualized upper  abdomen demonstrates scattered hypodensities within the liver again likely representing cysts. Renal cystic change on the right is noted stable in appearance from the prior exam. Right adrenal gland appears prominent when compared with the prior exam measuring up to 19 mm. This likely represents an adenoma although the possibility metastatic disease could not be totally excluded. Musculoskeletal: Degenerative changes of the thoracic spine are noted. No acute rib abnormality is seen. Review of the MIP images confirms the above findings. IMPRESSION: Progressive increased airspace opacity in the left upper lobe posterolaterally as described with some increase in mediastinal and left hilar adenopathy again suspicious for neoplasm although aggressive infectious infiltrate could not be totally excluded. New areas of bronchial narrowing are seen when compared with the prior exam. Bronchoscopic evaluation is recommended for further diagnostic purposes. Previously seen pulmonary artery emboli on the left are not visualized on today's exam. No new emboli are seen. There is however some increased attenuation and narrowing of the pulmonary arterial structures secondary to the hilar and parenchymal disease. Underlying interstitial lung disease with diffuse basilar honeycombing. Stable hypodensities within the liver and right kidney. These likely represent cysts. Prominent right adrenal gland increased when compared with the prior exam. This may simply represent an adenoma although metastatic disease could not be totally excluded given the findings in the chest. Aortic Atherosclerosis (ICD10-I70.0) and Emphysema (ICD10-J43.9). Electronically Signed: By: Inez Catalina M.D. On: 09/09/2020 21:21   DG Chest Port 1 View  Result Date: 09/25/2020 CLINICAL DATA:  Post bronch EXAM: PORTABLE CHEST 1 VIEW COMPARISON:  08/07/2020 FINDINGS: No pneumothorax. Increased left lung opacity superimposed on interstitial lung disease. No pleural  effusion. Left costophrenic angle is partially excluded. Stable cardiomediastinal contours. IMPRESSION: No pneumothorax. Increased left lung opacity since 08/07/2020. Electronically Signed   By: Macy Mis M.D.   On: 09/25/2020 11:20    ASSESSMENT: This is a very pleasant 70 years old white male recently diagnosed with at least stage IIIb (T3, N2, Mx) non-small cell lung cancer, adenocarcinoma presented with left upper lobe lung mass in addition to left hilar and mediastinal lymphadenopathy but metastatic disease could not be excluded at this point pending further staging work-up.   PLAN: I had a lengthy discussion with the patient and his wife today about his current disease stage, prognosis and treatment options. I personally and independently reviewed the scan images and discussed the results with the patient and his wife. I recommended for him to complete the staging work-up by ordering a PET scan as well as MRI of the brain. Will also send the tissue biopsy for molecular study and PD-L1 expression by [facility]. Definitely the patient is not a surgical candidate for resection because of the stage of his disease as well as the other comorbidities and respiratory compromise.  The patient has strong family history of lung disease with pneumothorax and his mother as well as his maternal aunt.  He also had pneumothorax at age 86.  These are concerning for underlying antitrypsin 1 COPD type but I will leave this portion for Dr. Shearon Stalls, his pulmonology to decide on further work-up or management. If the patient has no evidence of metastatic disease outside the currently known lung mass and lymphadenopathy in the left lung, he may be a candidate for a course of concurrent chemoradiation but the radiation portion will be also dependent on his pulmonary function. If he has evidence for metastatic disease, he will be treated with systemic therapy depending on the molecular studies versus palliative care and  hospice referral. The patient and his wife agreed to the current plan. He will come back for follow-up visit in 3 weeks for reevaluation and more detailed discussion of his treatment options depending the pending results. The patient was advised to call immediately if he has any other concerning symptoms in the interval.  The patient voices understanding of current disease status and treatment options and is in agreement with the current care plan.  All questions were answered. The patient knows to call the clinic with any problems, questions or concerns. We can certainly see the patient much sooner if necessary.  Thank you so much for allowing me to participate in the care of [facility]. I will continue to follow up the patient with you and assist in his care. The total time spent in the appointment was 60 minutes.  Disclaimer: This note was dictated with voice recognition software. Similar sounding words can inadvertently be transcribed and may not be corrected upon review.   Eilleen Kempf October 04, 2020, 2:54 PM

## 2020-10-05 ENCOUNTER — Ambulatory Visit (INDEPENDENT_AMBULATORY_CARE_PROVIDER_SITE_OTHER): Payer: Medicare Other

## 2020-10-05 ENCOUNTER — Telehealth: Payer: Self-pay | Admitting: Internal Medicine

## 2020-10-05 DIAGNOSIS — J849 Interstitial pulmonary disease, unspecified: Secondary | ICD-10-CM

## 2020-10-05 NOTE — Telephone Encounter (Signed)
Forwarding to Dr Shearon Stalls as Juluis Rainier

## 2020-10-05 NOTE — Progress Notes (Signed)
Six Minute Walk - 10/05/20 1632       Six Minute Walk   Medications taken before test (dose and time) Flonase and albuterol, not sure the times    Supplemental oxygen during test? No    Lap distance in meters  34 meters    Laps Completed 8    Partial lap (in meters) 1 meters    Baseline BP (sitting) 124/74    Baseline Heartrate 88    Baseline Dyspnea (Borg Scale) 4    Baseline Fatigue (Borg Scale) 3    Baseline SPO2 92 %      Interval Oxygen Saturation and HR    2 Minute Oxygen Saturation % 94 %    2 Minute HR 89      End of Test Values    BP (sitting) 128/80    Heartrate 93    Dyspnea (Borg Scale) 3    Fatigue (Borg Scale) 2    SPO2 88 %      2 Minutes Post Walk Values   BP (sitting) 118/78    Heartrate 89    SPO2 94 %    Stopped or paused before six minutes? No      Interpretation   Distance completed 273 meters    Tech Comments: patient went at average pace, no SOB

## 2020-10-09 ENCOUNTER — Other Ambulatory Visit: Payer: PRIVATE HEALTH INSURANCE

## 2020-10-09 ENCOUNTER — Encounter: Payer: Self-pay | Admitting: *Deleted

## 2020-10-09 NOTE — Progress Notes (Signed)
I followed up on Paul Roth's schedule for his scans. I was unclear if they have been authorized with insurance. I reached out to [place] team for an update. Wait for response.

## 2020-10-11 ENCOUNTER — Encounter: Payer: Self-pay | Admitting: *Deleted

## 2020-10-11 NOTE — Progress Notes (Signed)
I followed up on [facility] one report on patient portal.  I noted that testing was on hold.  I called [facility] for an update.  I was told that tissue specimen is marginal for testing and does Dr. Julien Nordmann want to precede with test. I updated Dr. Julien Nordmann and he would like to precede.  I notified [facility] that Dr. Julien Nordmann would like to proceed.  They will run moleculars as well as PDL 1

## 2020-10-15 ENCOUNTER — Encounter (HOSPITAL_COMMUNITY): Payer: Self-pay | Admitting: Internal Medicine

## 2020-10-15 ENCOUNTER — Ambulatory Visit (HOSPITAL_COMMUNITY)
Admission: RE | Admit: 2020-10-15 | Discharge: 2020-10-15 | Disposition: A | Discharge status: Home / Self Care | Payer: Medicare Other | Source: Ambulatory Visit | Attending: Internal Medicine | Admitting: Internal Medicine

## 2020-10-15 DIAGNOSIS — C3490 Malignant neoplasm of unspecified part of unspecified bronchus or lung: Secondary | ICD-10-CM | POA: Insufficient documentation

## 2020-10-15 MED ORDER — GADOBUTROL 1 MMOL/ML IV SOLN
8.0000 mL | Freq: Once | INTRAVENOUS | Status: AC | PRN
  Administered 2020-10-15: 8 mL via INTRAVENOUS

## 2020-10-16 LAB — CULTURE, FUNGUS WITHOUT SMEAR

## 2020-10-17 ENCOUNTER — Other Ambulatory Visit: Payer: Self-pay

## 2020-10-17 ENCOUNTER — Encounter: Payer: Self-pay | Admitting: *Deleted

## 2020-10-17 ENCOUNTER — Ambulatory Visit (INDEPENDENT_AMBULATORY_CARE_PROVIDER_SITE_OTHER): Payer: Medicare Other | Admitting: Internal Medicine

## 2020-10-17 VITALS — BP 120/72 | HR 98 | Temp 98.3°F | Ht 75.0 in | Wt 173.0 lb

## 2020-10-17 DIAGNOSIS — R0602 Shortness of breath: Secondary | ICD-10-CM

## 2020-10-17 DIAGNOSIS — J431 Panlobular emphysema: Secondary | ICD-10-CM

## 2020-10-17 DIAGNOSIS — J84112 Idiopathic pulmonary fibrosis: Secondary | ICD-10-CM

## 2020-10-17 DIAGNOSIS — J9611 Chronic respiratory failure with hypoxia: Secondary | ICD-10-CM | POA: Diagnosis not present

## 2020-10-17 DIAGNOSIS — J849 Interstitial pulmonary disease, unspecified: Secondary | ICD-10-CM

## 2020-10-17 DIAGNOSIS — C3491 Malignant neoplasm of unspecified part of right bronchus or lung: Secondary | ICD-10-CM

## 2020-10-17 LAB — PULMONARY FUNCTION TEST
FEF 25-75 Pre: 3.17 L/sec
FEF2575-%Pred-Pre: 110 %
FEV1-%Pred-Pre: 67 %
FEV1-Pre: 2.56 L
FEV1FVC-%Pred-Pre: 113 %
FEV6-%Pred-Pre: 62 %
FEV6-Pre: 3.02 L
FEV6FVC-%Pred-Pre: 105 %
FVC-%Pred-Pre: 59 %
FVC-Pre: 3.05 L
Pre FEV1/FVC ratio: 84 %
Pre FEV6/FVC Ratio: 100 %
RV % pred: 65 %
RV: 1.75 L
TLC % pred: 68 %
TLC: 5.35 L

## 2020-10-17 MED ORDER — UMECLIDINIUM-VILANTEROL 62.5-25 MCG/INH IN AEPB: 1.0000 | INHALATION_SPRAY | Freq: Every day | RESPIRATORY_TRACT | 5 refills | Status: AC

## 2020-10-17 NOTE — Patient Instructions (Signed)
Spirometry and Plethysmography performed today.

## 2020-10-17 NOTE — Progress Notes (Signed)
Spirometry and Plethysmography performed today.

## 2020-10-17 NOTE — Patient Instructions (Addendum)
Please schedule follow up scheduled with myself in 2 months.  If my schedule is not open yet, we will contact you with a reminder closer to that time.

## 2020-10-17 NOTE — Progress Notes (Signed)
Paul Roth    [id number]    11-18-1950  Primary Care Physician:Bouska, Shanon Brow, MD Date of Appointment: 10/17/2020 Established Patient Visit  Chief complaint:   Chief Complaint  Patient presents with   Follow-up    Pt states severe SOB, coughing up mucous.       HPI: Paul Roth is a 70 y.o. man with emphysema and new diagnosis adenocarcimona of the lung.   Interval Updates: Here for follow up after bronchoscopy. He is having dyspnea with ADLs. Cough with mucus production. Has seen Dr. Julien Nordmann and has pet scan scheduled this week. Taking albuterol prn with some benefit. Has lost weight.   I have reviewed the patient's family social and past medical history and updated as appropriate.   Past Medical History:  Diagnosis Date   Arthritis    Dyspnea    Hyperlipidemia    Tinnitus     Past Surgical History:  Procedure Laterality Date   BRONCHIAL BIOPSY  09/25/2020   Procedure: BRONCHIAL BIOPSIES;  Surgeon: [name], MD;  Location: [facility];  Service: Pulmonary;;   BRONCHIAL BRUSHINGS  09/25/2020   Procedure: BRONCHIAL BRUSHINGS;  Surgeon: [name], MD;  Location: [facility];  Service: Pulmonary;;   BRONCHIAL WASHINGS  09/25/2020   Procedure: BRONCHIAL WASHINGS;  Surgeon: [name], MD;  Location: [facility];  Service: Pulmonary;;   ENDOBRONCHIAL ULTRASOUND N/A 09/25/2020   Procedure: ENDOBRONCHIAL ULTRASOUND;  Surgeon: [name], MD;  Location: [facility];  Service: Pulmonary;  Laterality: N/A;   EYE SURGERY     TOTAL SHOULDER ARTHROPLASTY Right 07/26/2020   Procedure: TOTAL SHOULDER ARTHROPLASTY;  Surgeon: Tania Ade, MD;  Location: [facility];  Service: Orthopedics;  Laterality: Right;   VIDEO BRONCHOSCOPY N/A 09/25/2020   Procedure: VIDEO BRONCHOSCOPY WITHOUT FLUORO;  Surgeon: [name], MD;  Location: [facility];  Service: Pulmonary;  Laterality: N/A;    Family History  Problem Relation Age of Onset   Lung  disease Mother    Lung disease Maternal Aunt     Social History   Occupational History   Not on file  Tobacco Use   Smoking status: Former    Packs/day: 1.00    Years: 24.00    Pack years: 24.00    Types: Cigarettes    Quit date: 1990    Years since quitting: 32.7   Smokeless tobacco: Never  Vaping Use   Vaping Use: Never used  Substance and Sexual Activity   Alcohol use: Yes    Comment: rare   Drug use: Not Currently    Types: Marijuana    Comment: hx of years ago   Sexual activity: Not on file     Physical Exam: Blood pressure 120/72, pulse 98, temperature 98.3 F (36.8 C), temperature source Oral, height 6\' 3"  (1.905 m), weight 173 lb (78.5 kg), SpO2 91 %.  Gen:      No acute distress ENT:  no nasal polyps, mucus membranes moist Lungs:    diminished, bibasilar crackles CV:         tachycardic rate and regular rhythm; no murmurs, rubs, or gallops.  No pedal edema Ext:       digital clubbing  Data Reviewed: Imaging: I have personally reviewed the CT Chest from 09/09/20  PFTs:  PFT Results Latest Ref Rng & Units 10/17/2020  FVC-Pre L 3.05  FVC-Predicted Pre % 59  Pre FEV1/FVC % % 84  FEV1-Pre L 2.56  FEV1-Predicted  Pre % 67  TLC L 5.35  TLC % Predicted % 68  RV % Predicted % 65   I have personally reviewed the patient's PFTs and they show moderate restriction to ventilation.  Labs:  Immunization status: Immunization History  Administered Date(s) Administered   Influenza Whole 11/27/2012   Influenza-Unspecified 11/12/2018   PFIZER Comirnaty(Gray Top)Covid-19 Tri-Sucrose Vaccine 05/18/2020   PFIZER(Purple Top)SARS-COV-2 Vaccination 03/19/2019, 04/12/2019, 11/01/2019, 05/18/2020   Pneumococcal Conjugate-13 07/06/2019   Pneumococcal Polysaccharide-23 11/27/2012   Zoster, Live 11/27/2012    Assessment:  Pulmonary Emphysema Chronic respiratory failure Metastatic Adenocarcinoma of the lung primary  Plan/Recommendations: Pattern of emphysema is  concerning for CPFE. Will present in multidisciplinary case conference. I suspect he has pseudonormalization pattern on his PFTs.  Ambulatory desaturation study performed today and he does qualify for new start home oxygen therapy.  Seeing Dr. Julien Nordmann after PET scan this week. MRI brain shows density in the calvarium, possible osseus metastasis? Will do a trial of anoro to see if this helps his dyspnea. Continue prn albuterol.   Return to Care: Return in about 2 months (around 12/17/2020).   Lenice Llamas, MD Pulmonary and [facility]

## 2020-10-18 ENCOUNTER — Encounter: Payer: Self-pay | Admitting: *Deleted

## 2020-10-18 NOTE — Progress Notes (Signed)
I followed up on patients molecular test and PDL 1.  The PDL 1 is resulted and I will notify Dr. Julien Nordmann. I noted on [facility] patient portal that his molecular test was on hold.  I called to clarify.  [facility] did not resume testing after the call on 9/8 to precede with test.  They will resume tissue testing today.  I will update Dr. Julien Nordmann.

## 2020-10-19 ENCOUNTER — Ambulatory Visit (HOSPITAL_COMMUNITY)
Admission: RE | Admit: 2020-10-19 | Discharge: 2020-10-19 | Disposition: A | Discharge status: Home / Self Care | Payer: Medicare Other | Source: Ambulatory Visit | Attending: Internal Medicine | Admitting: Internal Medicine

## 2020-10-19 ENCOUNTER — Encounter: Payer: Self-pay | Admitting: *Deleted

## 2020-10-19 ENCOUNTER — Other Ambulatory Visit: Payer: Self-pay

## 2020-10-19 DIAGNOSIS — C787 Secondary malignant neoplasm of liver and intrahepatic bile duct: Secondary | ICD-10-CM | POA: Insufficient documentation

## 2020-10-19 DIAGNOSIS — I712 Thoracic aortic aneurysm, without rupture: Secondary | ICD-10-CM | POA: Insufficient documentation

## 2020-10-19 DIAGNOSIS — I700 Atherosclerosis of aorta: Secondary | ICD-10-CM | POA: Diagnosis not present

## 2020-10-19 DIAGNOSIS — J90 Pleural effusion, not elsewhere classified: Secondary | ICD-10-CM | POA: Diagnosis not present

## 2020-10-19 DIAGNOSIS — N289 Disorder of kidney and ureter, unspecified: Secondary | ICD-10-CM | POA: Insufficient documentation

## 2020-10-19 DIAGNOSIS — C3490 Malignant neoplasm of unspecified part of unspecified bronchus or lung: Secondary | ICD-10-CM | POA: Insufficient documentation

## 2020-10-19 DIAGNOSIS — I2510 Atherosclerotic heart disease of native coronary artery without angina pectoris: Secondary | ICD-10-CM | POA: Diagnosis not present

## 2020-10-19 DIAGNOSIS — J439 Emphysema, unspecified: Secondary | ICD-10-CM | POA: Insufficient documentation

## 2020-10-19 DIAGNOSIS — K5730 Diverticulosis of large intestine without perforation or abscess without bleeding: Secondary | ICD-10-CM | POA: Diagnosis not present

## 2020-10-19 LAB — GLUCOSE, CAPILLARY: Glucose-Capillary: 100 mg/dL — ABNORMAL HIGH (ref 70–99)

## 2020-10-19 MED ORDER — FLUDEOXYGLUCOSE F - 18 (FDG) INJECTION
8.5000 | Freq: Once | INTRAVENOUS | Status: AC | PRN
  Administered 2020-10-19: 8.5 via INTRAVENOUS

## 2020-10-19 NOTE — Progress Notes (Signed)
I followed up on Mr. Paul Roth's molecular testing with [facility]. I noticed a hold still on his specimen. I called to get an update. The technician is reaching out to their manager to help get the resolved.  I asked that they notify me with an update. I gave them my phone number and email.

## 2020-10-22 ENCOUNTER — Telehealth: Payer: Self-pay | Admitting: Radiation Oncology

## 2020-10-22 ENCOUNTER — Inpatient Hospital Stay: Payer: Medicare Other

## 2020-10-22 ENCOUNTER — Encounter: Payer: Self-pay | Admitting: Internal Medicine

## 2020-10-22 ENCOUNTER — Other Ambulatory Visit: Payer: Self-pay

## 2020-10-22 ENCOUNTER — Inpatient Hospital Stay (HOSPITAL_BASED_OUTPATIENT_CLINIC_OR_DEPARTMENT_OTHER): Payer: Medicare Other | Admitting: Internal Medicine

## 2020-10-22 VITALS — BP 115/82 | HR 100 | Temp 97.9°F | Resp 20 | Ht 75.0 in | Wt 171.0 lb

## 2020-10-22 DIAGNOSIS — G893 Neoplasm related pain (acute) (chronic): Secondary | ICD-10-CM | POA: Insufficient documentation

## 2020-10-22 DIAGNOSIS — C3492 Malignant neoplasm of unspecified part of left bronchus or lung: Secondary | ICD-10-CM | POA: Insufficient documentation

## 2020-10-22 DIAGNOSIS — C3412 Malignant neoplasm of upper lobe, left bronchus or lung: Secondary | ICD-10-CM | POA: Diagnosis not present

## 2020-10-22 DIAGNOSIS — Z5111 Encounter for antineoplastic chemotherapy: Secondary | ICD-10-CM | POA: Insufficient documentation

## 2020-10-22 LAB — CBC WITH DIFFERENTIAL (CANCER CENTER ONLY)
Abs Immature Granulocytes: 0.08 10*3/uL — ABNORMAL HIGH (ref 0.00–0.07)
Basophils Absolute: 0.1 10*3/uL (ref 0.0–0.1)
Basophils Relative: 1 %
Eosinophils Absolute: 0.6 10*3/uL — ABNORMAL HIGH (ref 0.0–0.5)
Eosinophils Relative: 5 %
HCT: 42.7 % (ref 39.0–52.0)
Hemoglobin: 14.9 g/dL (ref 13.0–17.0)
Immature Granulocytes: 1 %
Lymphocytes Relative: 11 %
Lymphs Abs: 1.2 10*3/uL (ref 0.7–4.0)
MCH: 30.5 pg (ref 26.0–34.0)
MCHC: 34.9 g/dL (ref 30.0–36.0)
MCV: 87.3 fL (ref 80.0–100.0)
Monocytes Absolute: 1.3 10*3/uL — ABNORMAL HIGH (ref 0.1–1.0)
Monocytes Relative: 11 %
Neutro Abs: 8.2 10*3/uL — ABNORMAL HIGH (ref 1.7–7.7)
Neutrophils Relative %: 71 %
Platelet Count: 219 10*3/uL (ref 150–400)
RBC: 4.89 MIL/uL (ref 4.22–5.81)
RDW: 13 % (ref 11.5–15.5)
WBC Count: 11.5 10*3/uL — ABNORMAL HIGH (ref 4.0–10.5)
nRBC: 0 % (ref 0.0–0.2)

## 2020-10-22 LAB — CMP (CANCER CENTER ONLY)
ALT: 25 U/L (ref 0–44)
AST: 20 U/L (ref 15–41)
Albumin: 3.1 g/dL — ABNORMAL LOW (ref 3.5–5.0)
Alkaline Phosphatase: 87 U/L (ref 38–126)
Anion gap: 14 (ref 5–15)
BUN: 10 mg/dL (ref 8–23)
CO2: 23 mmol/L (ref 22–32)
Calcium: 9.5 mg/dL (ref 8.9–10.3)
Chloride: 98 mmol/L (ref 98–111)
Creatinine: 0.88 mg/dL (ref 0.61–1.24)
GFR, Estimated: >60 mL/min (ref >60)
Glucose, Bld: 87 mg/dL (ref 70–99)
Potassium: 3.9 mmol/L (ref 3.5–5.1)
Sodium: 135 mmol/L (ref 135–145)
Total Bilirubin: 0.6 mg/dL (ref 0.3–1.2)
Total Protein: 6.7 g/dL (ref 6.5–8.1)

## 2020-10-22 LAB — TOTAL PROTEIN, URINE DIPSTICK: Protein, ur: NEGATIVE mg/dL

## 2020-10-22 MED ORDER — PROCHLORPERAZINE MALEATE 10 MG PO TABS: 10.0000 mg | ORAL_TABLET | Freq: Four times a day (QID) | ORAL | 0 refills | Status: DC | PRN

## 2020-10-22 MED ORDER — FOLIC ACID 1 MG PO TABS: 1.0000 mg | ORAL_TABLET | Freq: Every day | ORAL | 4 refills | Status: AC

## 2020-10-22 MED ORDER — CYANOCOBALAMIN 1000 MCG/ML IJ SOLN
1000.0000 ug | Freq: Once | INTRAMUSCULAR | Status: AC
  Administered 2020-10-22: 1000 ug via INTRAMUSCULAR

## 2020-10-22 MED ORDER — TRAMADOL HCL 50 MG PO TABS: 50.0000 mg | ORAL_TABLET | Freq: Four times a day (QID) | ORAL | 0 refills | Status: AC | PRN

## 2020-10-22 MED ORDER — DEXAMETHASONE 4 MG PO TABS: ORAL_TABLET | ORAL | 0 refills | Status: AC

## 2020-10-22 NOTE — Progress Notes (Signed)
[place] [phone]   [phone]  OFFICE PROGRESS NOTE  Bernerd Limbo, MD [address] [place] [address]  DIAGNOSIS: Stage IV (T3, N2, M1c) non-small cell lung cancer, adenocarcinoma presented with left upper lobe lung mass in addition to left hilar and mediastinal lymphadenopathy in addition to bilateral pulmonary nodules and widespread osseous metastatic disease as well as numerous metastatic lesions to the liver and bilateral adrenal metastatic lesion and a metastatic lesion to the pancreatic head, metastatic lesion to the pectoralis and biceps musculature diagnosed in August 2022.  PD-L1 expression 1%  PRIOR THERAPY: None  CURRENT THERAPY: Systemic chemotherapy with carboplatin for AUC of 5, Alimta 500 Mg/M2 and Avastin 15 Mg/KG every 3 weeks.  First dose October 29, 2020.  INTERVAL HISTORY: Paul Roth 70 y.o. male returns to the clinic today for follow-up visit accompanied by his wife.  The patient continues to complain of increasing fatigue and weakness as well as pain on the left side of the chest and right shoulder area.  He also has lack of appetite and weight loss.  He is currently on tramadol for pain management and helping some.  He denied having any current nausea, vomiting, diarrhea or constipation.  He continues to have shortness of breath at baseline increased with exertion and he is currently on home oxygen.  He also has cough with no hemoptysis.  He lost few pounds since his last visit.  He has no headache or visual changes.  He had several studies performed since his last visit including a PET scan as well as MRI of the brain.  The patient also has molecular studies for PD-L1 and it was 1%.  The remaining molecular studies are still pending.  He is here today for evaluation and discussion of his treatment plans.  MEDICAL HISTORY: Past Medical History:  Diagnosis Date   Arthritis    Dyspnea     Hyperlipidemia    Tinnitus     ALLERGIES:  has No Known Allergies.  MEDICATIONS:  Current Outpatient Medications  Medication Sig Dispense Refill   albuterol (VENTOLIN HFA) 108 (90 Base) MCG/ACT inhaler Inhale 2 puffs into the lungs every 6 (six) hours as needed for wheezing or shortness of breath. 8 g 6   apixaban (ELIQUIS) 5 MG TABS tablet Take 1 tablet (5 mg total) by mouth 2 (two) times daily. 60 tablet 5   Ascorbic Acid (VITAMIN C) 1000 MG tablet Take 1,000 mg by mouth daily.     atorvastatin (LIPITOR) 10 MG tablet Take 10 mg by mouth daily.     Cholecalciferol (DIALYVITE VITAMIN D 5000) 125 MCG (5000 UT) capsule Take 5,000 Units by mouth daily.     fluticasone (FLONASE) 50 MCG/ACT nasal spray Place 2 sprays into both nostrils daily.     MAGNESIUM PO Take 175 mg by mouth daily.     nortriptyline (PAMELOR) 75 MG capsule Take 75 mg by mouth at bedtime.     pantoprazole (PROTONIX) 40 MG tablet Take 1 tablet (40 mg total) by mouth 2 (two) times daily before a meal. 60 tablet 2   RESVERATROL PO Take 1 capsule by mouth daily.     rOPINIRole (REQUIP) 2 MG tablet Take 1 tablet (2 mg total) by mouth at bedtime. 30 tablet 1   trolamine salicylate (ASPERCREME) 10 % cream Apply 1 application topically as needed for muscle pain.     umeclidinium-vilanterol (ANORO ELLIPTA) 62.5-25 MCG/INH AEPB Inhale 1  puff into the lungs daily. 1 each 5   No current facility-administered medications for this visit.    SURGICAL HISTORY:  Past Surgical History:  Procedure Laterality Date   BRONCHIAL BIOPSY  09/25/2020   Procedure: BRONCHIAL BIOPSIES;  Surgeon: [name], MD;  Location: [facility];  Service: Pulmonary;;   BRONCHIAL BRUSHINGS  09/25/2020   Procedure: BRONCHIAL BRUSHINGS;  Surgeon: [name], MD;  Location: [facility];  Service: Pulmonary;;   BRONCHIAL WASHINGS  09/25/2020   Procedure: BRONCHIAL WASHINGS;  Surgeon: [name], MD;  Location: [facility];  Service: Pulmonary;;    ENDOBRONCHIAL ULTRASOUND N/A 09/25/2020   Procedure: ENDOBRONCHIAL ULTRASOUND;  Surgeon: [name], MD;  Location: [facility];  Service: Pulmonary;  Laterality: N/A;   EYE SURGERY     TOTAL SHOULDER ARTHROPLASTY Right 07/26/2020   Procedure: TOTAL SHOULDER ARTHROPLASTY;  Surgeon: Tania Ade, MD;  Location: [facility];  Service: Orthopedics;  Laterality: Right;   VIDEO BRONCHOSCOPY N/A 09/25/2020   Procedure: VIDEO BRONCHOSCOPY WITHOUT FLUORO;  Surgeon: [name], MD;  Location: [facility];  Service: Pulmonary;  Laterality: N/A;    REVIEW OF SYSTEMS:  Constitutional: positive for fatigue and weight loss Eyes: negative Ears, nose, mouth, throat, and face: negative Respiratory: positive for cough, dyspnea on exertion, and pleurisy/chest pain Cardiovascular: negative Gastrointestinal: negative Genitourinary:negative Integument/breast: negative Hematologic/lymphatic: negative Musculoskeletal:positive for bone pain Neurological: negative Behavioral/Psych: negative Endocrine: negative Allergic/Immunologic: negative   PHYSICAL EXAMINATION: General appearance: alert, cooperative, fatigued, and no distress Head: Normocephalic, without obvious abnormality, atraumatic Neck: no adenopathy, no JVD, supple, symmetrical, trachea midline, and thyroid not enlarged, symmetric, no tenderness/mass/nodules Lymph nodes: Cervical, supraclavicular, and axillary nodes normal. Resp: clear to auscultation bilaterally Back: symmetric, no curvature. ROM normal. No CVA tenderness. Cardio: regular rate and rhythm, S1, S2 normal, no murmur, click, rub or gallop GI: soft, non-tender; bowel sounds normal; no masses,  no organomegaly Extremities: extremities normal, atraumatic, no cyanosis or edema Neurologic: Alert and oriented X 3, normal strength and tone. Normal symmetric reflexes. Normal coordination and gait  ECOG PERFORMANCE STATUS: 1 - Symptomatic but completely ambulatory  Blood pressure  115/82, pulse 100, temperature 97.9 F (36.6 C), temperature source Tympanic, resp. rate 20, height 6' 3"  (1.905 m), weight 171 lb (77.6 kg), SpO2 95 %.  LABORATORY DATA: Lab Results  Component Value Date   WBC 9.1 10/04/2020   HGB 14.5 10/04/2020   HCT 43.0 10/04/2020   MCV 90.7 10/04/2020   PLT 238 10/04/2020      Chemistry      Component Value Date/Time   NA 136 10/04/2020 1443   K 4.2 10/04/2020 1443   CL 100 10/04/2020 1443   CO2 25 10/04/2020 1443   BUN 9 10/04/2020 1443   CREATININE 0.91 10/04/2020 1443      Component Value Date/Time   CALCIUM 9.4 10/04/2020 1443   ALKPHOS 91 10/04/2020 1443   AST 14 (L) 10/04/2020 1443   ALT 13 10/04/2020 1443   BILITOT 0.5 10/04/2020 1443       RADIOGRAPHIC STUDIES: MR BRAIN W WO CONTRAST  Result Date: 10/16/2020 CLINICAL DATA:  Malignant neoplasm of unspecified part of unspecified bronchus or lung. Non-small cell lung cancer, staging. EXAM: MRI HEAD WITHOUT AND WITH CONTRAST TECHNIQUE: Multiplanar, multiecho pulse sequences of the brain and surrounding structures were obtained without and with intravenous contrast. CONTRAST:  78m GADAVIST GADOBUTROL 1 MMOL/ML IV SOLN COMPARISON:  No pertinent prior exams available for comparison. FINDINGS: Intermittently motion degraded examination.  Most notably, there is moderate motion degradation of the coronal T2 TSE sequence and mild motion degradation of the coronal T1 weighted postcontrast sequence. Brain: Mild generalized cerebral atrophy. Minimal multifocal T2/FLAIR hyperintensity within the cerebral white matter, nonspecific but compatible with chronic small vessel ischemic disease. There is no acute infarct. No evidence of an intracranial mass. No chronic intracranial blood products. No extra-axial fluid collection. No midline shift. No pathologic intracranial enhancement to suggest metastatic disease to the brain. Vascular: Maintained flow voids within the proximal large arterial vessels.  Skull and upper cervical spine: 14 mm T2 FLAIR hyperintense focus within the right parietal calvarium (series 11, image 46). Corresponding diffusion weighted signal abnormality at this site. Sinuses/Orbits: Visualized orbits show no acute finding. Bilateral lens replacements. Mild mucosal thickening along the floor of the left maxillary sinus. IMPRESSION: Intermittently motion degraded examination, as described. No evidence of metastatic disease to the brain. 14 mm focus of signal abnormality within the right parietal calvarium, nonspecific but suspicious for an osseous metastasis. Attention recommended on follow-up. Minimal chronic small-vessel ischemic changes within the cerebral white matter. Mild generalized cerebral atrophy. Mild left maxillary sinus mucosal thickening. Electronically Signed   By: Kellie Simmering D.O.   On: 10/16/2020 08:00   NM PET Image Initial (PI) Skull Base To Thigh (F-18 FDG)  Result Date: 10/20/2020 CLINICAL DATA:  Initial treatment strategy for non-small cell lung cancer. EXAM: NUCLEAR MEDICINE PET SKULL BASE TO THIGH TECHNIQUE: 8.7 mCi F-18 FDG was injected intravenously. Full-ring PET imaging was performed from the skull base to thigh after the radiotracer. CT data was obtained and used for attenuation correction and anatomic localization. Fasting blood glucose: 100 mg/dl COMPARISON:  Multiple exams, including CT chest 09/09/2020 FINDINGS: Mediastinal blood pool activity: SUV max 1.9 Liver activity: SUV max NA NECK: No significant abnormal hypermetabolic activity in this region. Incidental CT findings: none CHEST: Consolidative mass posteriorly in the left upper lobe with maximum SUV 25.5, with progressive and complete opacification of the left upper lobe aside from the lingula. The hypermetabolic region measures about 11.0 by 8.2 cm. A 6 mm left lower lobe nodule on image 91 of series 4 has a maximum SUV of 4.9, with metastatic lesion, several ground-glass opacities in the right  lung have vague metabolic activity, such as the right apical ground-glass opacity measuring 2.5 cm in diameter with maximum SUV of 4.3. There is some mild nodularity in the right middle lobe which appears faintly hypermetabolic concerning for potential malignancy. Indistinct hypermetabolic nodule along the diaphragmatic margin of the right lower lobe has a maximum SUV of 9.8. There is hypermetabolic prevascular, AP window, paratracheal, subcarinal, para-aortic, and left supraclavicular adenopathy. Index right lower paratracheal node 2.1 cm in short axis on image 75 series 4 with maximum SUV 24.9. Hypermetabolic lesions along the right lateral pectoralis and biceps musculature raising suspicion for malignancy, dominant index hypermetabolic lesion in this region with maximum SUV of 20.9. These nodules are difficult to separate out from the surrounding muscular tissues. Incidental CT findings: Moderate left pleural effusion, nonspecific for malignant involvement. Emphysema. Bilateral interstitial accentuation in the lungs. Coronary and aortic atherosclerosis. Ascending thoracic aortic aneurysm 4.4 cm in diameter on image 83 series 4. ABDOMEN/PELVIS: Scattered hepatic metastatic lesions are observed. Index hypodense lesion anteriorly in the left hepatic lobe measuring about 2.3 by 1.6 cm has maximum SUV of 27.1. Right adrenal metastatic lesion measuring 3.3 by 2.6 cm on image 115 series 4, maximum SUV 26.5. Smaller left adrenal metastatic foci are present.  Hypermetabolic focus in the pancreatic head measuring about 1.7 cm in diameter, maximum SUV 27.3. Small focus of activity near the right sciatic notch without a well-defined CT correlate, maximum SUV 4.5, this appears to be separate from the ureter which is more anterior in position. Incidental CT findings: Confluent complex cystic lesions of the right kidney upper pole posteriorly, not obviously hypermetabolic but not fully characterized on today's exam.  Atherosclerosis is present, including aortoiliac atherosclerotic disease. Sigmoid colon diverticulosis. SKELETON: Innumerable scattered skeletal metastatic lesions are in general poorly appreciable on the CT data, and include the right posterior calvarium, spine, bilateral scapula, sternum, ribs, bony pelvis, and proximal femurs. Index right sternal body lesion has a maximum SUV of 28.1. Index left L4 lesion maximum SUV 25.6. Incidental CT findings: Right proximal humeral prosthesis. Endplate sclerosis at O8-4. Endplate sclerosis and possible chronic superior endplate compression at T11-12. IMPRESSION: 1. 11 cm highly hypermetabolic consolidative mass posteriorly in the left upper lobe associated with extensive adenopathy, suspected bilateral small pulmonary metastatic lesions, widespread osseous metastatic disease, numerous metastatic lesions to the liver, bilateral adrenal metastatic lesions, and a metastatic lesion to the pancreatic head. 2. Small focus of activity near the right sciatic notch without a well-defined CT correlate, artifact versus a small metastatic focus. 3. Focal hypermetabolic lesions in the right anterior shoulder vicinity along the pectoralis and biceps musculature, suspicious for metastatic lesions. 4. Moderate left pleural effusion is nonspecific for malignant involvement. Substantial bilateral interstitial accentuation in underlying emphysema in the lungs. 5. Confluent complex cystic lesions of the right kidney upper pole posteriorly with faint calcifications along septations. This may warrant future workup with renal protocol MRI with and without contrast when the patient's clinical scenario permits, to assigned Bosniak classification. 6. Ascending thoracic aortic aneurysm 4.4 cm in diameter. Recommend annual imaging followup by CTA or MRA. This recommendation follows 2010 ACCF/AHA/AATS/ACR/ASA/SCA/SCAI/SIR/STS/SVM Guidelines for the Diagnosis and Management of Patients with Thoracic  Aortic Disease. Circulation. 2010; 121: Z660-Y301. Aortic aneurysm NOS (ICD10-I71.9) 7. Other imaging findings of potential clinical significance: Aortic Atherosclerosis (ICD10-I70.0). Coronary atherosclerosis. Sigmoid colon diverticulosis. Electronically Signed   By: Van Clines M.D.   On: 10/20/2020 08:19   DG Chest Port 1 View  Result Date: 09/25/2020 CLINICAL DATA:  Post bronch EXAM: PORTABLE CHEST 1 VIEW COMPARISON:  08/07/2020 FINDINGS: No pneumothorax. Increased left lung opacity superimposed on interstitial lung disease. No pleural effusion. Left costophrenic angle is partially excluded. Stable cardiomediastinal contours. IMPRESSION: No pneumothorax. Increased left lung opacity since 08/07/2020. Electronically Signed   By: Macy Mis M.D.   On: 09/25/2020 11:20    ASSESSMENT AND PLAN: This is a very pleasant 70 years old white male recently diagnosed with a stage IV ( T3, N2, M1c) non-small cell lung cancer presented with large left upper lobe lung mass in addition to left hilar and mediastinal lymphadenopathy as well as bilateral pulmonary nodules and osseous metastatic disease as well as numerous metastatic lesions to the liver, bilateral adrenal glands and metastatic lesion to the pancreatic head and to the muscular area in the pectoralis and biceps muscles diagnosed in August 2022. The molecular studies are still pending but PD-L1 expression is 1%. I had a lengthy discussion with the patient and his wife today about his current disease stage, prognosis and treatment options.  I personally and independently reviewed the scan images and discussed the result and showed the images to the patient and his wife. I will order Guardant 360 for molecular studies because of suspicious insufficient material  for the tissue sample sent to [facility]. I explained to the patient that he has incurable condition and all the treatment will be of palliative nature.  I gave him the option of palliative  care and hospice versus consideration of palliative systemic chemotherapy initially with carboplatin for AUC of 5, Alimta 500 Mg/M2 and Avastin 15 Mg/KG for 1 cycle until the molecular studies becomes available.  If the patient has no actionable mutation, I will change the Avastin to [facility] starting from cycle #2. If the patient has an actionable mutation, he will be treated with targeted therapy. For the painful lesion in the left lung as well as the right shoulder, I will refer the patient to radiation oncology for consideration of palliative radiotherapy.  I will also give the patient refill of tramadol today. The patient will receive vitamin B12 injection today.  I discussed with him the adverse effect of the chemotherapy including but not limited to alopecia, myelosuppression, nausea and vomiting, peripheral neuropathy, liver or renal dysfunction as well as the hemorrhagic adverse effect from Avastin. The patient will have a chemotherapy education class before the first dose of his treatment. I will call his pharmacy with prescription for Compazine 10 mg p.o. every 6 hours as needed for nausea in addition to Decadron 4 mg p.o. twice daily the day before and day of as well as the day after chemotherapy.  I will also give him prescription for folic acid 1 mg p.o. daily. The patient is expected to start the first cycle of this treatment next week. He will come back for follow-up visit in 2 weeks for evaluation and management of any adverse effect of his treatment. The patient was advised to call immediately if he has any other concerning symptoms in the interval. The patient voices understanding of current disease status and treatment options and is in agreement with the current care plan.  All questions were answered. The patient knows to call the clinic with any problems, questions or concerns. We can certainly see the patient much sooner if necessary. The total time spent in the appointment was 55  minutes.  Disclaimer: This note was dictated with voice recognition software. Similar sounding words can inadvertently be transcribed and may not be corrected upon review.

## 2020-10-22 NOTE — Progress Notes (Signed)
START ON PATHWAY REGIMEN - Non-Small Cell Lung     Cycles 1 through up to 6: A cycle is every 21 days:     Bevacizumab-xxxx      Pemetrexed      Carboplatin   **Always confirm dose/schedule in your pharmacy ordering system**  Patient Characteristics: Stage IV Metastatic, Nonsquamous, Awaiting Molecular Test Results and Need to Start Chemotherapy, PS = 0, 1 Therapeutic Status: Stage IV Metastatic Histology: Nonsquamous Cell Broad Molecular Profiling Status: Awaiting Molecular Test Results and Need to Start Chemotherapy ECOG Performance Status: 1 Intent of Therapy: Non-Curative / Palliative Intent, Discussed with Patient

## 2020-10-22 NOTE — Patient Instructions (Signed)
Cyanocobalamin, Vitamin B12 injection O que  este medicamento? A CIANOCOBALAMINA  uma forma sinttica de vitamina B12. A vitamina B12  essencial para o desenvolvimento de glbulos sanguneos, clulas nervosas e protenas saudveis pelo organismo. Tambm ajuda no metabolismo das gorduras e carboidratos. Este medicamento  usado para tratar pessoas que no conseguem absorver vitamina B12 suficiente. Este medicamento pode ser usado para outros propsitos; em caso de dvidas, pergunte ao seu profissional de sade ou farmacutico. NOMES DE MARCAS COMUNS: B-12 Compliance Kit, B-12 Injection Kit, Cyomin, LA-12, Nutri-Twelve, Physicians EZ Use B-12, Primabalt O que devo dizer a meu profissional de sade antes de tomar este medicamento? Precisam saber se voc tem algum dos seguintes problemas ou estados de sade: doenas renais sndrome ou doena de Leber anemia megaloblstica reao estranha ou alergia  cianocobalamina ou ao cobalto reao estranha ou alergia a outros medicamentos, alimentos, corantes ou conservantes est grvida ou tentando engravidar est amamentando Como devo usar este medicamento? Este medicamento  injetado por via intramuscular ou por injeo subcutnea profunda. Costuma ser administrado por um profissional de sade em consultrio ou Chief of Staff. Porm,  possvel que seu mdico lhe ensine como aplicar suas prprias injees. Siga todas as instrues. Fale com seu pediatra a respeito do uso deste medicamento em crianas. Pode ser preciso tomar alguns cuidados especiais. Superdosagem: Se achar que tomou uma superdosagem deste medicamento, entre em contato imediatamente com o [facility] ou v a [employer]. OBSERVAO: Este medicamento  s para voc. No compartilhe este medicamento com outras pessoas. E se eu deixar de tomar uma dose? Se toma o medicamento em uma clnica ou no consultrio do seu mdico, ligue para Paramedic a Passenger transport manager. Se aplica as  injees por conta prpria e perdeu uma dose, tome-a assim que possvel. Se j estiver quase na hora da sua prxima dose, tome somente essa dose. No tome o remdio em dobro, nem tome uma dose adicional. O que pode interagir com este medicamento? colchicina consumo pesado de lcool Esta lista pode no descrever todas as interaes possveis. D ao seu profissional de sade uma lista de todos os medicamentos, ervas medicinais, remdios de venda livre, ou suplementos alimentares que voc [place]. Diga tambm se voc fuma, bebe, ou [place] drogas ilcitas. Alguns destes podem interagir com o seu medicamento. Ao que devo ficar atento quando estiver [employer] medicamento? Consulte seu mdico ou profissional de sade para acompanhamento regular Museum/gallery curator. Voc precisar fazer exames de sangue peridicos enquanto estiver [employer]. Voc pode precisar seguir uma dieta especial. Fale com seu mdico. Para conseguir o mximo benefcio deste medicamento, limite o seu consumo de lcool e evite fumar. Que efeitos colaterais posso sentir aps usar este medicamento? Efeitos colaterais que devem ser informados ao seu mdico ou profissional de sade o mais rpido possvel: reaes alrgicas, como erupo na pele, coceira, urticria, ou inchao do rosto, dos lbios ou da lngua pele azulada dor ou aperto no peito chiado no peito ou dificuldade para respirar tontura rea vermelha, inchada e dolorosa na perna Efeitos colaterais que normalmente no precisam de cuidados mdicos (avise ao seu mdico ou profissional de sade se persistirem ou forem incmodos): diarreia dor de cabea Esta lista pode no descrever todos os efeitos colaterais possveis. Para mais orientaes sobre efeitos colaterais, consulte o seu mdico. Voc pode relatar a ocorrncia de efeitos colaterais  FDA pelo telefone [phone]. Onde devo guardar meu medicamento? [facility] fora do [employer]. Conservar em [employer], entre 15 e 30 degreesC (  59 e 86 degreesF). Proteger Administrator, arts. Descartar qualquer medicamento no utilizado aps a data de validade impressa no rtulo ou embalagem. OBSERVAO: Este folheto  um resumo. Pode no cobrir todas as informaes possveis. Se tiver dvidas a respeito deste medicamento, fale com seu mdico, farmacutico ou profissional de sade.  2021 Elsevier/Gold Standard (2009-10-24 00:00:00)

## 2020-10-24 ENCOUNTER — Other Ambulatory Visit: Payer: Self-pay

## 2020-10-24 ENCOUNTER — Inpatient Hospital Stay: Payer: Medicare Other

## 2020-10-24 ENCOUNTER — Other Ambulatory Visit (HOSPITAL_COMMUNITY): Payer: PRIVATE HEALTH INSURANCE

## 2020-10-24 LAB — RESPIRATORY CULTURE OR RESPIRATORY AND SPUTUM CULTURE
MICRO NUMBER:: 12230908
RESULT:: NORMAL
SPECIMEN QUALITY:: ADEQUATE

## 2020-10-24 LAB — FUNGUS CULTURE W SMEAR
MICRO NUMBER:: 12230907
SPECIMEN QUALITY:: ADEQUATE

## 2020-10-25 ENCOUNTER — Telehealth: Payer: Self-pay | Admitting: Medical Oncology

## 2020-10-25 NOTE — Telephone Encounter (Signed)
Massage therapy requested  Can pt take massage therapy for his back pain which is  getting worse?  Vitamin C -What is Dr Worthy Flank opinion of [place] getting Vitamin C IV in addition to chemotherapy?

## 2020-10-26 ENCOUNTER — Emergency Department (HOSPITAL_COMMUNITY): Payer: Medicare Other

## 2020-10-26 ENCOUNTER — Inpatient Hospital Stay (HOSPITAL_COMMUNITY): Payer: Medicare Other

## 2020-10-26 ENCOUNTER — Other Ambulatory Visit: Payer: Self-pay

## 2020-10-26 ENCOUNTER — Telehealth: Payer: Self-pay | Admitting: Medical Oncology

## 2020-10-26 ENCOUNTER — Encounter: Payer: Self-pay | Admitting: Internal Medicine

## 2020-10-26 ENCOUNTER — Inpatient Hospital Stay (HOSPITAL_COMMUNITY)
Admission: EM | Admit: 2020-10-26 | Discharge: 2020-11-03 | DRG: 189 | Disposition: E | Payer: Medicare Other | Attending: Critical Care Medicine | Admitting: Critical Care Medicine

## 2020-10-26 DIAGNOSIS — R54 Age-related physical debility: Secondary | ICD-10-CM | POA: Diagnosis present

## 2020-10-26 DIAGNOSIS — Z6821 Body mass index (BMI) 21.0-21.9, adult: Secondary | ICD-10-CM

## 2020-10-26 DIAGNOSIS — R0603 Acute respiratory distress: Secondary | ICD-10-CM

## 2020-10-26 DIAGNOSIS — J9601 Acute respiratory failure with hypoxia: Secondary | ICD-10-CM | POA: Diagnosis present

## 2020-10-26 DIAGNOSIS — F410 Panic disorder [episodic paroxysmal anxiety] without agoraphobia: Secondary | ICD-10-CM | POA: Diagnosis present

## 2020-10-26 DIAGNOSIS — J432 Centrilobular emphysema: Secondary | ICD-10-CM | POA: Diagnosis present

## 2020-10-26 DIAGNOSIS — Z7901 Long term (current) use of anticoagulants: Secondary | ICD-10-CM

## 2020-10-26 DIAGNOSIS — G893 Neoplasm related pain (acute) (chronic): Secondary | ICD-10-CM | POA: Diagnosis present

## 2020-10-26 DIAGNOSIS — C3490 Malignant neoplasm of unspecified part of unspecified bronchus or lung: Secondary | ICD-10-CM

## 2020-10-26 DIAGNOSIS — I824Y9 Acute embolism and thrombosis of unspecified deep veins of unspecified proximal lower extremity: Secondary | ICD-10-CM | POA: Diagnosis not present

## 2020-10-26 DIAGNOSIS — N179 Acute kidney failure, unspecified: Secondary | ICD-10-CM | POA: Diagnosis present

## 2020-10-26 DIAGNOSIS — I214 Non-ST elevation (NSTEMI) myocardial infarction: Secondary | ICD-10-CM | POA: Diagnosis not present

## 2020-10-26 DIAGNOSIS — I712 Thoracic aortic aneurysm, without rupture: Secondary | ICD-10-CM | POA: Diagnosis present

## 2020-10-26 DIAGNOSIS — Z86718 Personal history of other venous thrombosis and embolism: Secondary | ICD-10-CM | POA: Diagnosis not present

## 2020-10-26 DIAGNOSIS — J90 Pleural effusion, not elsewhere classified: Secondary | ICD-10-CM | POA: Diagnosis present

## 2020-10-26 DIAGNOSIS — D72829 Elevated white blood cell count, unspecified: Secondary | ICD-10-CM | POA: Diagnosis present

## 2020-10-26 DIAGNOSIS — R042 Hemoptysis: Secondary | ICD-10-CM | POA: Diagnosis not present

## 2020-10-26 DIAGNOSIS — Z7189 Other specified counseling: Secondary | ICD-10-CM | POA: Diagnosis not present

## 2020-10-26 DIAGNOSIS — N281 Cyst of kidney, acquired: Secondary | ICD-10-CM

## 2020-10-26 DIAGNOSIS — C7951 Secondary malignant neoplasm of bone: Secondary | ICD-10-CM | POA: Diagnosis present

## 2020-10-26 DIAGNOSIS — Z87891 Personal history of nicotine dependence: Secondary | ICD-10-CM | POA: Diagnosis not present

## 2020-10-26 DIAGNOSIS — I252 Old myocardial infarction: Secondary | ICD-10-CM | POA: Diagnosis not present

## 2020-10-26 DIAGNOSIS — R778 Other specified abnormalities of plasma proteins: Secondary | ICD-10-CM | POA: Diagnosis present

## 2020-10-26 DIAGNOSIS — C3492 Malignant neoplasm of unspecified part of left bronchus or lung: Secondary | ICD-10-CM

## 2020-10-26 DIAGNOSIS — J9621 Acute and chronic respiratory failure with hypoxia: Principal | ICD-10-CM | POA: Diagnosis present

## 2020-10-26 DIAGNOSIS — Z66 Do not resuscitate: Secondary | ICD-10-CM | POA: Diagnosis not present

## 2020-10-26 DIAGNOSIS — I7121 Aneurysm of the ascending aorta, without rupture: Secondary | ICD-10-CM | POA: Diagnosis present

## 2020-10-26 DIAGNOSIS — R7989 Other specified abnormal findings of blood chemistry: Secondary | ICD-10-CM | POA: Diagnosis present

## 2020-10-26 DIAGNOSIS — R531 Weakness: Secondary | ICD-10-CM | POA: Diagnosis not present

## 2020-10-26 DIAGNOSIS — Z515 Encounter for palliative care: Secondary | ICD-10-CM

## 2020-10-26 DIAGNOSIS — Z20822 Contact with and (suspected) exposure to covid-19: Secondary | ICD-10-CM | POA: Diagnosis present

## 2020-10-26 DIAGNOSIS — Z79899 Other long term (current) drug therapy: Secondary | ICD-10-CM | POA: Diagnosis not present

## 2020-10-26 DIAGNOSIS — E871 Hypo-osmolality and hyponatremia: Secondary | ICD-10-CM | POA: Diagnosis present

## 2020-10-26 DIAGNOSIS — R64 Cachexia: Secondary | ICD-10-CM | POA: Diagnosis present

## 2020-10-26 DIAGNOSIS — C7989 Secondary malignant neoplasm of other specified sites: Secondary | ICD-10-CM | POA: Diagnosis present

## 2020-10-26 DIAGNOSIS — R0600 Dyspnea, unspecified: Secondary | ICD-10-CM | POA: Diagnosis present

## 2020-10-26 LAB — COMPREHENSIVE METABOLIC PANEL
ALT: 19 U/L (ref 0–44)
AST: 27 U/L (ref 15–41)
Albumin: 3.5 g/dL (ref 3.5–5.0)
Alkaline Phosphatase: 107 U/L (ref 38–126)
Anion gap: 16 — ABNORMAL HIGH (ref 5–15)
BUN: 11 mg/dL (ref 8–23)
CO2: 22 mmol/L (ref 22–32)
Calcium: 9.1 mg/dL (ref 8.9–10.3)
Chloride: 86 mmol/L — ABNORMAL LOW (ref 98–111)
Creatinine, Ser: 0.95 mg/dL (ref 0.61–1.24)
GFR, Estimated: >60 mL/min (ref >60)
Glucose, Bld: 135 mg/dL — ABNORMAL HIGH (ref 70–99)
Potassium: 4.4 mmol/L (ref 3.5–5.1)
Sodium: 124 mmol/L — ABNORMAL LOW (ref 135–145)
Total Bilirubin: 1.3 mg/dL — ABNORMAL HIGH (ref 0.3–1.2)
Total Protein: 7.4 g/dL (ref 6.5–8.1)

## 2020-10-26 LAB — BLOOD GAS, VENOUS
Acid-base deficit: 1.4 mmol/L (ref 0.0–2.0)
Bicarbonate: 23.6 mmol/L (ref 20.0–28.0)
O2 Saturation: 33.1 %
Patient temperature: 98.6
pCO2, Ven: 42.8 mmHg — ABNORMAL LOW (ref 44.0–60.0)
pH, Ven: 7.361 (ref 7.250–7.430)
pO2, Ven: <31 mmHg — CL (ref 32.0–45.0)

## 2020-10-26 LAB — TSH: TSH: 0.712 u[IU]/mL (ref 0.350–4.500)

## 2020-10-26 LAB — CBC WITH DIFFERENTIAL/PLATELET
Abs Immature Granulocytes: 0.2 10*3/uL — ABNORMAL HIGH (ref 0.00–0.07)
Basophils Absolute: 0.1 10*3/uL (ref 0.0–0.1)
Basophils Relative: 1 %
Eosinophils Absolute: 0 10*3/uL (ref 0.0–0.5)
Eosinophils Relative: 0 %
HCT: 49.2 % (ref 39.0–52.0)
Hemoglobin: 16.9 g/dL (ref 13.0–17.0)
Immature Granulocytes: 1 %
Lymphocytes Relative: 7 %
Lymphs Abs: 1.1 10*3/uL (ref 0.7–4.0)
MCH: 30.1 pg (ref 26.0–34.0)
MCHC: 34.3 g/dL (ref 30.0–36.0)
MCV: 87.7 fL (ref 80.0–100.0)
Monocytes Absolute: 1.9 10*3/uL — ABNORMAL HIGH (ref 0.1–1.0)
Monocytes Relative: 12 %
Neutro Abs: 12.9 10*3/uL — ABNORMAL HIGH (ref 1.7–7.7)
Neutrophils Relative %: 79 %
Platelets: 195 10*3/uL (ref 150–400)
RBC: 5.61 MIL/uL (ref 4.22–5.81)
RDW: 12.7 % (ref 11.5–15.5)
WBC: 16.2 10*3/uL — ABNORMAL HIGH (ref 4.0–10.5)
nRBC: 0 % (ref 0.0–0.2)

## 2020-10-26 LAB — PROTIME-INR
INR: 1.4 — ABNORMAL HIGH (ref 0.8–1.2)
Prothrombin Time: 17.5 seconds — ABNORMAL HIGH (ref 11.4–15.2)

## 2020-10-26 LAB — RESP PANEL BY RT-PCR (FLU A&B, COVID) ARPGX2
Influenza A by PCR: NEGATIVE
Influenza B by PCR: NEGATIVE
SARS Coronavirus 2 by RT PCR: NEGATIVE

## 2020-10-26 LAB — TROPONIN I (HIGH SENSITIVITY)
Troponin I (High Sensitivity): 304 ng/L (ref <18)
Troponin I (High Sensitivity): 294 ng/L (ref <18)

## 2020-10-26 LAB — LACTIC ACID, PLASMA
Lactic Acid, Venous: 2.7 mmol/L (ref 0.5–1.9)
Lactic Acid, Venous: 3.2 mmol/L (ref 0.5–1.9)

## 2020-10-26 LAB — APTT: aPTT: 31 seconds (ref 24–36)

## 2020-10-26 LAB — HEPARIN LEVEL (UNFRACTIONATED): Heparin Unfractionated: >1.1 IU/mL — ABNORMAL HIGH (ref 0.30–0.70)

## 2020-10-26 LAB — BRAIN NATRIURETIC PEPTIDE: B Natriuretic Peptide: 54.5 pg/mL (ref 0.0–100.0)

## 2020-10-26 LAB — PROCALCITONIN: Procalcitonin: 0.15 ng/mL

## 2020-10-26 MED ORDER — MORPHINE SULFATE (PF) 2 MG/ML IV SOLN
2.0000 mg | Freq: Once | INTRAVENOUS | Status: AC
Start: 2020-10-26 — End: 2020-10-26
  Administered 2020-10-26: 2 mg via INTRAVENOUS
  Filled 2020-10-26: qty 1

## 2020-10-26 MED ORDER — DEXTROSE-NACL 5-0.9 % IV SOLN: INTRAVENOUS | Status: DC

## 2020-10-26 MED ORDER — SODIUM CHLORIDE 0.9 % IV SOLN
1.0000 g | Freq: Once | INTRAVENOUS | Status: AC
  Administered 2020-10-26: 1 g via INTRAVENOUS
  Filled 2020-10-26: qty 10

## 2020-10-26 MED ORDER — IOHEXOL 350 MG/ML SOLN
80.0000 mL | Freq: Once | INTRAVENOUS | Status: AC | PRN
  Administered 2020-10-26: 80 mL via INTRAVENOUS

## 2020-10-26 MED ORDER — ROPINIROLE HCL 1 MG PO TABS
2.0000 mg | ORAL_TABLET | Freq: Every day | ORAL | Status: DC
  Filled 2020-10-26 (×6): qty 2

## 2020-10-26 MED ORDER — ASPIRIN 81 MG PO CHEW
324.0000 mg | CHEWABLE_TABLET | ORAL | Status: AC
  Administered 2020-10-26: 324 mg via ORAL
  Filled 2020-10-26: qty 4

## 2020-10-26 MED ORDER — FENTANYL CITRATE (PF) 100 MCG/2ML IJ SOLN
25.0000 ug | INTRAMUSCULAR | Status: DC | PRN
  Administered 2020-10-27 – 2020-10-29 (×14): 100 ug via INTRAVENOUS
  Administered 2020-10-29: 50 ug via INTRAVENOUS
  Administered 2020-10-30: 100 ug via INTRAVENOUS
  Administered 2020-10-30: 50 ug via INTRAVENOUS
  Administered 2020-10-30: 100 ug via INTRAVENOUS
  Filled 2020-10-26 (×19): qty 2

## 2020-10-26 MED ORDER — ASPIRIN 81 MG PO CHEW
81.0000 mg | CHEWABLE_TABLET | Freq: Every day | ORAL | Status: DC
  Administered 2020-10-27 – 2020-10-29 (×2): 81 mg via ORAL
  Filled 2020-10-26 (×2): qty 1

## 2020-10-26 MED ORDER — ALBUTEROL SULFATE (2.5 MG/3ML) 0.083% IN NEBU
INHALATION_SOLUTION | RESPIRATORY_TRACT | Status: AC
  Administered 2020-10-26: 5 mg
  Filled 2020-10-26: qty 6

## 2020-10-26 MED ORDER — CHLORHEXIDINE GLUCONATE 0.12 % MT SOLN
15.0000 mL | Freq: Two times a day (BID) | OROMUCOSAL | Status: DC
  Administered 2020-10-28 – 2020-10-29 (×4): 15 mL via OROMUCOSAL
  Filled 2020-10-26 (×6): qty 15

## 2020-10-26 MED ORDER — MAGNESIUM SULFATE 2 GM/50ML IV SOLN
2.0000 g | Freq: Once | INTRAVENOUS | Status: AC
  Administered 2020-10-26: 2 g via INTRAVENOUS
  Filled 2020-10-26: qty 50

## 2020-10-26 MED ORDER — HYDROMORPHONE HCL 1 MG/ML IJ SOLN: 0.5000 mg | INTRAMUSCULAR | Status: DC | PRN

## 2020-10-26 MED ORDER — CHLORHEXIDINE GLUCONATE CLOTH 2 % EX PADS
6.0000 | MEDICATED_PAD | Freq: Every day | CUTANEOUS | Status: DC
  Administered 2020-10-26 – 2020-10-29 (×4): 6 via TOPICAL

## 2020-10-26 MED ORDER — ALBUTEROL (5 MG/ML) CONTINUOUS INHALATION SOLN
5.0000 mg/h | INHALATION_SOLUTION | Freq: Once | RESPIRATORY_TRACT | Status: DC
  Filled 2020-10-26: qty 20

## 2020-10-26 MED ORDER — SODIUM CHLORIDE 0.9 % IV SOLN
500.0000 mg | INTRAVENOUS | Status: AC
  Administered 2020-10-27 – 2020-10-29 (×3): 500 mg via INTRAVENOUS
  Filled 2020-10-26 (×3): qty 500

## 2020-10-26 MED ORDER — ATORVASTATIN CALCIUM 10 MG PO TABS
10.0000 mg | ORAL_TABLET | Freq: Every day | ORAL | Status: DC
  Administered 2020-10-26 – 2020-10-27 (×2): 10 mg via ORAL
  Filled 2020-10-26 (×2): qty 1

## 2020-10-26 MED ORDER — SODIUM CHLORIDE 0.9 % IV SOLN
500.0000 mg | Freq: Once | INTRAVENOUS | Status: AC
  Administered 2020-10-26: 500 mg via INTRAVENOUS
  Filled 2020-10-26: qty 500

## 2020-10-26 MED ORDER — ORAL CARE MOUTH RINSE: 15.0000 mL | Freq: Two times a day (BID) | OROMUCOSAL | Status: DC

## 2020-10-26 MED ORDER — METHYLPREDNISOLONE SODIUM SUCC 125 MG IJ SOLR: 60.0000 mg | Freq: Every day | INTRAMUSCULAR | Status: DC

## 2020-10-26 MED ORDER — HEPARIN (PORCINE) 25000 UT/250ML-% IV SOLN
900.0000 [IU]/h | INTRAVENOUS | Status: DC
  Administered 2020-10-26: 900 [IU]/h via INTRAVENOUS
  Filled 2020-10-26: qty 250

## 2020-10-26 MED ORDER — POLYETHYLENE GLYCOL 3350 17 G PO PACK: 17.0000 g | PACK | Freq: Every day | ORAL | Status: DC | PRN

## 2020-10-26 MED ORDER — ASPIRIN 300 MG RE SUPP: 300.0000 mg | RECTAL | Status: AC

## 2020-10-26 MED ORDER — IPRATROPIUM-ALBUTEROL 0.5-2.5 (3) MG/3ML IN SOLN
RESPIRATORY_TRACT | Status: AC
  Administered 2020-10-26: 3 mL via RESPIRATORY_TRACT
  Filled 2020-10-26: qty 3

## 2020-10-26 MED ORDER — SODIUM CHLORIDE 0.9 % IV SOLN
1.0000 g | INTRAVENOUS | Status: DC
  Administered 2020-10-27 – 2020-10-29 (×3): 1 g via INTRAVENOUS
  Filled 2020-10-26 (×3): qty 10

## 2020-10-26 MED ORDER — BUDESONIDE 0.25 MG/2ML IN SUSP
0.2500 mg | Freq: Two times a day (BID) | RESPIRATORY_TRACT | Status: DC
  Administered 2020-10-27 – 2020-10-30 (×7): 0.25 mg via RESPIRATORY_TRACT
  Filled 2020-10-26 (×7): qty 2

## 2020-10-26 MED ORDER — DOCUSATE SODIUM 100 MG PO CAPS: 100.0000 mg | ORAL_CAPSULE | Freq: Two times a day (BID) | ORAL | Status: DC | PRN

## 2020-10-26 MED ORDER — METHYLPREDNISOLONE SODIUM SUCC 125 MG IJ SOLR
125.0000 mg | Freq: Once | INTRAMUSCULAR | Status: AC
  Administered 2020-10-26: 125 mg via INTRAVENOUS
  Filled 2020-10-26: qty 2

## 2020-10-26 MED ORDER — FAMOTIDINE IN NACL 20-0.9 MG/50ML-% IV SOLN
20.0000 mg | Freq: Two times a day (BID) | INTRAVENOUS | Status: DC
  Administered 2020-10-26: 20 mg via INTRAVENOUS
  Filled 2020-10-26 (×2): qty 50

## 2020-10-26 MED ORDER — IPRATROPIUM-ALBUTEROL 0.5-2.5 (3) MG/3ML IN SOLN
3.0000 mL | Freq: Four times a day (QID) | RESPIRATORY_TRACT | Status: DC
  Administered 2020-10-27 – 2020-10-30 (×15): 3 mL via RESPIRATORY_TRACT
  Filled 2020-10-26 (×14): qty 3

## 2020-10-26 MED ORDER — ALBUTEROL SULFATE (2.5 MG/3ML) 0.083% IN NEBU: 2.5000 mg | INHALATION_SOLUTION | RESPIRATORY_TRACT | Status: DC | PRN

## 2020-10-26 MED ORDER — ENSURE ENLIVE PO LIQD: 237.0000 mL | Freq: Two times a day (BID) | ORAL | Status: DC

## 2020-10-26 MED ORDER — SODIUM CHLORIDE 0.9 % IV BOLUS
1000.0000 mL | Freq: Once | INTRAVENOUS | Status: AC
  Administered 2020-10-26: 1000 mL via INTRAVENOUS

## 2020-10-26 MED ORDER — MORPHINE SULFATE (PF) 2 MG/ML IV SOLN
2.0000 mg | INTRAVENOUS | Status: DC
  Administered 2020-10-26: 2 mg via INTRAVENOUS
  Filled 2020-10-26: qty 1

## 2020-10-26 MED ORDER — BUDESONIDE 0.25 MG/2ML IN SUSP
RESPIRATORY_TRACT | Status: AC
  Administered 2020-10-26: 0.25 mg via RESPIRATORY_TRACT
  Filled 2020-10-26: qty 2

## 2020-10-26 MED ORDER — NORTRIPTYLINE HCL 25 MG PO CAPS
75.0000 mg | ORAL_CAPSULE | Freq: Every day | ORAL | Status: DC
  Filled 2020-10-26 (×4): qty 3

## 2020-10-26 MED ORDER — ONDANSETRON HCL 4 MG/2ML IJ SOLN: 4.0000 mg | Freq: Four times a day (QID) | INTRAMUSCULAR | Status: DC | PRN

## 2020-10-26 MED ORDER — ACETAMINOPHEN 325 MG PO TABS: 650.0000 mg | ORAL_TABLET | ORAL | Status: DC | PRN

## 2020-10-26 MED FILL — Dexamethasone Sodium Phosphate Inj 100 MG/10ML: INTRAMUSCULAR | Qty: 1 | Status: AC

## 2020-10-26 MED FILL — Fosaprepitant Dimeglumine For IV Infusion 150 MG (Base Eq): INTRAVENOUS | Qty: 5 | Status: AC

## 2020-10-26 NOTE — Telephone Encounter (Signed)
Paul Roth is " having bad coughing fits, he panics and starts to hyperventilate."   The coughing started last night and again at 0630 today. Wife states he is very stressed. I encouraged her to contact pcp to address his anxiety.  Per Dr Julien Nordmann , I instructed wife to get OTC Delsym and have pt start that.

## 2020-10-26 NOTE — ED Notes (Signed)
Per pt's wife, pt has difficulty swallowing pills at baseline. Upon admin of PO meds, pt's O2 sat dropped to 82% on RA due to delay in swallowing pills. Carson Myrtle, MD made aware

## 2020-10-26 NOTE — Progress Notes (Signed)
ANTICOAGULATION CONSULT NOTE - Initial Consult  Pharmacy Consult for Heparin Indication: chest pain/ACS  No Known Allergies  Patient Measurements: Height: 6\' 3"  (190.5 cm) Weight: 77.1 kg (170 lb) IBW/kg (Calculated) : 84.5 Heparin Dosing Weight: 77 kg  Vital Signs: Temp: 99.7 F (37.6 C) (09/23 1927) Temp Source: Rectal (09/23 1927) BP: 99/72 (09/23 2030) Pulse Rate: 124 (09/23 2030)  Labs: Recent Labs    10/16/2020 1826 10/24/2020 2012  HGB 16.9  --   HCT 49.2  --   PLT 195  --   APTT 31  --   LABPROT 17.5*  --   INR 1.4*  --   CREATININE 0.95  --   TROPONINIHS 304* 294*    Estimated Creatinine Clearance: 78.9 mL/min (by C-G formula based on SCr of 0.95 mg/dL).   Medical History: Past Medical History:  Diagnosis Date   Arthritis    Dyspnea    Hyperlipidemia    Tinnitus     Medications:  Apixaban 5 mg bid PTA for h/o PE  Assessment: 70 y/o M with recent diagnosis of lung cancer not yet on chemotherapy and relatively recent h/o PE on Eliquis admitted with ShOB. Pharmacy consulted to initiate heparin infusion for ACS.   Goal of Therapy:  Heparin level 0.3-0.7 units/ml aPTT 66-102 seconds Monitor platelets by anticoagulation protocol: Yes   Plan:  Initiate heparin infusion at 900 units/hr aPTT in 8 hours Daily HL/aPTT/CBC until levels corelate  Ulice Dash D 10/05/2020,9:14 PM

## 2020-10-26 NOTE — ED Provider Notes (Signed)
Paul Roth   CSN: [id number] Arrival date & time: 10/13/2020  1810     History Chief Complaint  Patient presents with   Respiratory Distress    Paul Roth is a 70 y.o. male.  He has a history of non-small cell lung cancer with hilar and mediastinal lymphadenopathy pulmonary nodules metastatic disease.  He is here with complaint of increased shortness of breath that started yesterday.  Increased fatigue.  Pain with cough.  No hemoptysis.  No known fevers.  Symptoms acutely worsened today and arrives in respiratory distress.  Sats in the 70s.  The history is provided by the patient.  Shortness of Breath Severity:  Severe Onset quality:  Gradual Duration:  2 days Timing:  Constant Progression:  Worsening Chronicity:  New Relieved by:  Nothing Worsened by:  Activity and coughing Ineffective treatments:  Rest Associated symptoms: chest pain, cough and sputum production   Associated symptoms: no abdominal pain, no fever, no headaches, no hemoptysis, no neck pain, no rash and no sore throat   Risk factors: hx of cancer       Past Medical History:  Diagnosis Date   Arthritis    Dyspnea    Hyperlipidemia    Tinnitus     Patient Active Problem List   Diagnosis Date Noted   Adenocarcinoma of left lung, stage 4 ([place]) 10/22/2020   Encounter for antineoplastic chemotherapy 10/22/2020   Cancer associated pain 10/22/2020   Adenocarcinoma of left lung, stage 3 ([place]) 10/04/2020   Pulmonary embolism ([place]) 08/04/2020   NSTEMI (non-ST elevated myocardial infarction) ([place]) 08/04/2020   Interstitial lung disease ([place]) 08/04/2020   Dyspnea 08/04/2020   Mass of upper lobe of left lung 08/04/2020   Leukocytosis 08/04/2020    Past Surgical History:  Procedure Laterality Date   BRONCHIAL BIOPSY  09/25/2020   Procedure: BRONCHIAL BIOPSIES;  Surgeon: [name], MD;  Location: [facility];  Service: Pulmonary;;   BRONCHIAL  BRUSHINGS  09/25/2020   Procedure: BRONCHIAL BRUSHINGS;  Surgeon: [name], MD;  Location: [facility];  Service: Pulmonary;;   BRONCHIAL WASHINGS  09/25/2020   Procedure: BRONCHIAL WASHINGS;  Surgeon: [name], MD;  Location: [facility];  Service: Pulmonary;;   ENDOBRONCHIAL ULTRASOUND N/A 09/25/2020   Procedure: ENDOBRONCHIAL ULTRASOUND;  Surgeon: [name], MD;  Location: [facility];  Service: Pulmonary;  Laterality: N/A;   EYE SURGERY     TOTAL SHOULDER ARTHROPLASTY Right 07/26/2020   Procedure: TOTAL SHOULDER ARTHROPLASTY;  Surgeon: Tania Ade, MD;  Location: [facility];  Service: Orthopedics;  Laterality: Right;   VIDEO BRONCHOSCOPY N/A 09/25/2020   Procedure: VIDEO BRONCHOSCOPY WITHOUT FLUORO;  Surgeon: [name], MD;  Location: [facility];  Service: Pulmonary;  Laterality: N/A;       Family History  Problem Relation Age of Onset   Lung disease Mother    Lung disease Maternal Aunt     Social History   Tobacco Use   Smoking status: Former    Packs/day: 1.00    Years: 24.00    Pack years: 24.00    Types: Cigarettes    Quit date: 1990    Years since quitting: 32.7   Smokeless tobacco: Never  Vaping Use   Vaping Use: Never used  Substance Use Topics   Alcohol use: Yes    Comment: rare   Drug use: Not Currently    Types: Marijuana    Comment: hx of years ago    Home  Medications Prior to Admission medications   Medication Sig Start Date End Date Taking? Authorizing Provider  albuterol (VENTOLIN HFA) 108 (90 Base) MCG/ACT inhaler Inhale 2 puffs into the lungs every 6 (six) hours as needed for wheezing or shortness of breath. 09/12/20   Magdalen Spatz, NP  apixaban (ELIQUIS) 5 MG TABS tablet Take 1 tablet (5 mg total) by mouth 2 (two) times daily. 08/14/20 09/18/20  Shawna Clamp, MD  Ascorbic Acid (VITAMIN C) 1000 MG tablet Take 1,000 mg by mouth daily.    [provider]  atorvastatin (LIPITOR) 10 MG tablet Take 10 mg by mouth  daily. 05/15/20   [provider]  Cholecalciferol (DIALYVITE VITAMIN D 5000) 125 MCG (5000 UT) capsule Take 5,000 Units by mouth daily.    [provider]  dexamethasone (DECADRON) 4 MG tablet 1 tablet p.o. twice daily the day before, day of and day after chemotherapy every 3 weeks 10/22/20   Curt Bears, MD  fluticasone [facility]) 50 MCG/ACT nasal spray Place 2 sprays into both nostrils daily.    [provider]  folic acid (FOLVITE) 1 MG tablet Take 1 tablet (1 mg total) by mouth daily. 10/22/20   Curt Bears, MD  MAGNESIUM PO Take 175 mg by mouth daily.    [provider]  nortriptyline (PAMELOR) 75 MG capsule Take 75 mg by mouth at bedtime. 06/23/20   [provider]  pantoprazole (PROTONIX) 40 MG tablet Take 1 tablet (40 mg total) by mouth 2 (two) times daily before a meal. 08/08/20   Shawna Clamp, MD  prochlorperazine (COMPAZINE) 10 MG tablet Take 1 tablet (10 mg total) by mouth every 6 (six) hours as needed for nausea or vomiting. 10/22/20   Curt Bears, MD  RESVERATROL PO Take 1 capsule by mouth daily.    [provider]  rOPINIRole (REQUIP) 2 MG tablet Take 1 tablet (2 mg total) by mouth at bedtime. 09/12/20 11/11/20  Magdalen Spatz, NP  traMADol (ULTRAM) 50 MG tablet Take 1 tablet (50 mg total) by mouth every 6 (six) hours as needed. 10/22/20   Curt Bears, MD  trolamine salicylate (ASPERCREME) 10 % cream Apply 1 application topically as needed for muscle pain.    [provider]  umeclidinium-vilanterol (ANORO ELLIPTA) 62.5-25 MCG/INH AEPB Inhale 1 puff into the lungs daily. 10/17/20   [name], MD    Allergies    Patient has no known allergies.  Review of Systems   Review of Systems  Constitutional:  Positive for fatigue. Negative for fever.  HENT:  Negative for sore throat.   Eyes:  Negative for visual disturbance.  Respiratory:  Positive for cough, sputum production and shortness of breath. Negative  for hemoptysis.   Cardiovascular:  Positive for chest pain.  Gastrointestinal:  Negative for abdominal pain.  Genitourinary:  Negative for dysuria.  Musculoskeletal:  Negative for neck pain.  Skin:  Negative for rash.  Neurological:  Negative for headaches.   Physical Exam Updated Vital Signs BP 108/75 (BP Location: Right Arm)   Pulse (!) 119   Resp (!) 29   Ht 6\' 3"  (1.905 m)   Wt 77.1 kg   SpO2 100%   BMI 21.25 kg/m   Physical Exam Vitals and nursing Roth reviewed.  Constitutional:      General: He is in acute distress.     Appearance: Normal appearance. He is well-developed. He is diaphoretic.  HENT:     Head: Normocephalic and atraumatic.  Eyes:  Conjunctiva/sclera: Conjunctivae normal.  Cardiovascular:     Rate and Rhythm: Regular rhythm. Tachycardia present.     Heart sounds: No murmur heard. Pulmonary:     Effort: Tachypnea, accessory muscle usage and respiratory distress present.     Breath sounds: Normal breath sounds. Decreased air movement present.  Abdominal:     Palpations: Abdomen is soft.     Tenderness: There is no abdominal tenderness. There is no guarding or rebound.  Musculoskeletal:        General: No deformity or signs of injury. Normal range of motion.     Cervical back: Neck supple.  Skin:    General: Skin is warm.  Neurological:     General: No focal deficit present.     Mental Status: He is alert.     Sensory: No sensory deficit.     Motor: No weakness.    ED Results / Procedures / Treatments   Labs (all labs ordered are listed, but only abnormal results are displayed) Labs Reviewed  LACTIC ACID, PLASMA - Abnormal; Notable for the following components:      Result Value   Lactic Acid, Venous 2.7 (*)    All other components within normal limits  LACTIC ACID, PLASMA - Abnormal; Notable for the following components:   Lactic Acid, Venous 3.2 (*)    All other components within normal limits  COMPREHENSIVE METABOLIC PANEL - Abnormal;  Notable for the following components:   Sodium 124 (*)    Chloride 86 (*)    Glucose, Bld 135 (*)    Total Bilirubin 1.3 (*)    Anion gap 16 (*)    All other components within normal limits  CBC WITH DIFFERENTIAL/PLATELET - Abnormal; Notable for the following components:   WBC 16.2 (*)    Neutro Abs 12.9 (*)    Monocytes Absolute 1.9 (*)    Abs Immature Granulocytes 0.20 (*)    All other components within normal limits  PROTIME-INR - Abnormal; Notable for the following components:   Prothrombin Time 17.5 (*)    INR 1.4 (*)    All other components within normal limits  BLOOD GAS, VENOUS - Abnormal; Notable for the following components:   pCO2, Ven 42.8 (*)    pO2, Ven <31.0 (*)    All other components within normal limits  CBC - Abnormal; Notable for the following components:   WBC 12.1 (*)    Platelets 146 (*)    All other components within normal limits  BASIC METABOLIC PANEL - Abnormal; Notable for the following components:   Sodium 126 (*)    Chloride 95 (*)    CO2 19 (*)    Glucose, Bld 197 (*)    Calcium 8.5 (*)    All other components within normal limits  BLOOD GAS, ARTERIAL - Abnormal; Notable for the following components:   pH, Arterial 7.332 (*)    Bicarbonate 19.0 (*)    Acid-base deficit 5.8 (*)    All other components within normal limits  OSMOLALITY - Abnormal; Notable for the following components:   Osmolality 266 (*)    All other components within normal limits  HEPARIN LEVEL (UNFRACTIONATED) - Abnormal; Notable for the following components:   Heparin Unfractionated >1.10 (*)    All other components within normal limits  APTT - Abnormal; Notable for the following components:   aPTT 37 (*)    All other components within normal limits  LACTIC ACID, PLASMA - Abnormal; Notable for the following components:  Lactic Acid, Venous 5.4 (*)    All other components within normal limits  LACTIC ACID, PLASMA - Abnormal; Notable for the following components:    Lactic Acid, Venous 3.6 (*)    All other components within normal limits  TROPONIN I (HIGH SENSITIVITY) - Abnormal; Notable for the following components:   Troponin I (High Sensitivity) 304 (*)    All other components within normal limits  TROPONIN I (HIGH SENSITIVITY) - Abnormal; Notable for the following components:   Troponin I (High Sensitivity) 294 (*)    All other components within normal limits  CULTURE, BLOOD (ROUTINE X 2)  CULTURE, BLOOD (ROUTINE X 2)  RESP PANEL BY RT-PCR (FLU A&B, COVID) ARPGX2  MRSA NEXT GEN BY PCR, NASAL  APTT  BRAIN NATRIURETIC PEPTIDE  PROCALCITONIN  CORTISOL  MAGNESIUM  TSH  APTT  URINALYSIS, ROUTINE W REFLEX MICROSCOPIC  OSMOLALITY, URINE  OSMOLALITY, URINE  SODIUM, URINE, RANDOM  APTT    EKG EKG Interpretation  Date/Time:  Friday October 26 2020 18:26:23 EDT Ventricular Rate:  118 PR Interval:  139 QRS Duration: 98 QT Interval:  322 QTC Calculation: 452 R Axis:   260 Text Interpretation: Sinus tachycardia Probable left atrial enlargement Inferior infarct, old Confirmed by Aletta Edouard [id number]) on 10/27/2020 10:30:19 AM  Radiology CT Angio Chest Pulmonary Embolism (PE) W or WO Contrast  Result Date: 10/19/2020 CLINICAL DATA:  Non-small cell lung cancer, dyspnea, high probability pulmonary embolism EXAM: CT ANGIOGRAPHY CHEST WITH CONTRAST TECHNIQUE: Multidetector CT imaging of the chest was performed using the standard protocol during bolus administration of intravenous contrast. Multiplanar CT image reconstructions and MIPs were obtained to evaluate the vascular anatomy. CONTRAST:  18mL OMNIPAQUE IOHEXOL 350 MG/ML SOLN COMPARISON:  09/09/2020, PET CT 10/19/2020 FINDINGS: Cardiovascular: There is adequate opacification of the pulmonary arterial tree. No intraluminal filling defect identified to suggest acute pulmonary embolism. The central pulmonary arteries are enlarged in keeping with changes of pulmonary arterial hypertension. No  significant coronary artery calcification. Global cardiac size within normal limits. The the thoracic aorta is mildly dilated in its ascending segment measuring 4.4 cm in greatest dimension. Descending thoracic aorta is of normal caliber. Mild atherosclerotic calcification within the thoracic aorta. Mediastinum/Nodes: Pathologic left supraclavicular, mediastinal and bilateral hilar adenopathy is again identified in keeping with widespread nodal metastatic disease. The esophagus is patulous, but is otherwise unremarkable. The visualized thyroid is unremarkable. Lungs/Pleura: Since the prior examination, there has developed segmental occlusion of the left upper lobe pulmonary bronchus, best seen on image # 77/11, possibly related to intraluminal debris, mucus, or extrinsic compression secondary to pathologic adenopathy, with dense consolidation of the left upper lobe likely representing postobstructive pneumonic consolidation. There is, additionally, interval development of extensive consolidation and ground-glass pulmonary infiltrate throughout the right lung which may be related to acute infection or can relate to changes of immunotherapy in the appropriate clinical setting. Moderate left pleural effusion has developed in the interval since prior examination. Superimposed extensive pulmonary fibrotic changes again seen with honeycombing at the lung bases. No pneumothorax. Upper Abdomen: Scattered low-attenuation lesions within the right hepatic dome and left hepatic lobe more metabolically active on prior PET-CT examination and likely represent multiple cysts. Known hepatic metastases are not well appreciated on this examination. No acute abnormality. Musculoskeletal: No acute bone abnormality. Numerous osseous metastases noted on prior PET CT examination are largely occult on this examination though a single mixed lytic and sclerotic metastasis noted within the lateral right seventh rib. Review of the MIP images  confirms the above findings. IMPRESSION: No pulmonary embolism. Interval occlusion of the left upper lobe pulmonary bronchus, possibly related to extrinsic compression from adjacent pathologic adenopathy or endoluminal debris or mucus. Dense consolidation of the left upper lobe may reflect changes of a postobstructive pneumonia. Widespread extensive consolidation and ground-glass infiltrate within the right lung, possibly reflecting changes of acute infection. Interval development of moderate left pleural effusion. Stable changes of pulmonary fibrosis with extensive bibasilar honeycombing. Widespread mediastinal, hilar, and left supraclavicular adenopathy in keeping with nodal metastatic disease. Known osseous and hepatic metastases are not well appreciated on this examination and are better visualized on prior PET-CT examination of 10/19/2020. Dilation of the ascending thoracic aorta with maximal dimension of 4.4 cm. Recommend annual imaging followup by CTA or MRA. This recommendation follows 2010 ACCF/AHA/AATS/ACR/ASA/SCA/SCAI/SIR/STS/SVM Guidelines for the Diagnosis and Management of Patients with Thoracic Aortic Disease. Circulation. 2010; 121: E703-J009. Aortic aneurysm NOS (ICD10-I71.9) Aortic Atherosclerosis (ICD10-I70.0). Electronically Signed   By: Fidela Salisbury M.D.   On: 10/23/2020 22:26   DG Chest Port 1 View  Result Date: 10/27/2020 CLINICAL DATA:  Respiratory failure EXAM: PORTABLE CHEST 1 VIEW COMPARISON:  10/10/2020 FINDINGS: Heart size stable. Signs of mediastinal adenopathy again noted. Small left pleural effusion, unchanged. Dense airspace consolidation throughout the left upper lobe is unchanged. Patchy airspace densities within the right upper lobe are also unchanged. Diffuse reticular interstitial opacities are noted within the visualized lower lung zones. Visualized osseous structures are unremarkable. IMPRESSION: 1. No significant change in aeration to lungs compared with the previous  exam. 2. Small left pleural effusion. 3. Stable mediastinal adenopathy. Electronically Signed   By: Kerby Moors M.D.   On: 10/27/2020 06:35   DG Chest Portable 1 View  Result Date: 10/11/2020 CLINICAL DATA:  Prior diagnosis of lung cancer 3 months ago, presenting with shortness of breath. EXAM: PORTABLE CHEST 1 VIEW COMPARISON:  None. FINDINGS: Large area of opacification is seen involving the left apex and lateral aspect of the mid to upper left lung. This is increased in severity when compared to the prior study. Diffuse, chronic interstitial lung disease is seen. A superimposed moderate severity infiltrate is seen overlying the mid to upper right lung. This is new when compared to the prior exam. The heart size and mediastinal contours are within normal limits. The visualized skeletal structures are unremarkable. IMPRESSION: 1. Large area of opacification involving the left apex and lateral aspect of the mid to upper left lung, which likely represents sequelae associated with the patient's history of lung cancer. Associated component of loculated pleural fluid can not be excluded. 2. Chronic interstitial lung disease with superimposed moderate severity infiltrate within the mid to upper right lung. Electronically Signed   By: Virgina Norfolk M.D.   On: 10/27/2020 19:09   ECHOCARDIOGRAM COMPLETE  Result Date: 10/27/2020    ECHOCARDIOGRAM REPORT   Patient Name:   Paul Roth Date of Exam: 10/27/2020 Medical Rec #:  [id number]         Height:       74.0 in Accession #:    [id number]        Weight:       169.8 lb Date of Birth:  1951-02-03         BSA:          2.027 m Patient Age:    39 years          BP:           117/72 mmHg Patient Gender:  M                 HR:           100 bpm. Exam Location:  Inpatient Procedure: 2D Echo, Color Doppler and Cardiac Doppler Indications:    NSTEMI i21.4  History:        Patient has prior history of Echocardiogram examinations, most                 recent  08/06/2020. Risk Factors:Dyslipidemia and Lung Cancer.  Sonographer:    Raquel Sarna Senior RDCS Referring Phys: [id number] Renee Pain  Sonographer Comments: Technically difficult due to lung interference, scanned upright due to dyspnea. IMPRESSIONS  1. Aorta remains signficantly dilated compared to TTE 08/16/20.  2. RV size and function worse compared to TTE 08/16/20.  3. Left ventricular ejection fraction, by estimation, is 60 to 65%. The left ventricle has normal function. The left ventricle has no regional wall motion abnormalities. Left ventricular diastolic parameters were normal.  4. Right ventricular systolic function is severely reduced. The right ventricular size is severely enlarged. There is moderately elevated pulmonary artery systolic pressure.  5. Some shadowing artifact seen in RA.  6. The mitral valve is normal in structure. No evidence of mitral valve regurgitation. No evidence of mitral stenosis.  7. The aortic valve is tricuspid. There is mild calcification of the aortic valve. Aortic valve regurgitation is not visualized. Mild aortic valve sclerosis is present, with no evidence of aortic valve stenosis.  8. Aortic dilatation noted. There is severe dilatation of the aortic root, measuring 46 mm. There is dilatation of the ascending aorta, measuring 45 mm.  9. The inferior vena cava is normal in size with <50% respiratory variability, suggesting right atrial pressure of 8 mmHg. FINDINGS  Left Ventricle: Left ventricular ejection fraction, by estimation, is 60 to 65%. The left ventricle has normal function. The left ventricle has no regional wall motion abnormalities. The left ventricular internal cavity size was normal in size. There is  no left ventricular hypertrophy. Left ventricular diastolic parameters were normal. Right Ventricle: The right ventricular size is severely enlarged. Right vetricular wall thickness was not assessed. Right ventricular systolic function is severely reduced. There is  moderately elevated pulmonary artery systolic pressure. The tricuspid regurgitant velocity is 3.35 m/s, and with an assumed right atrial pressure of 8 mmHg, the estimated right ventricular systolic pressure is 26.8 mmHg. Left Atrium: Left atrial size was normal in size. Right Atrium: Some shadowing artifact seen in RA. Right atrial size was normal in size. Pericardium: There is no evidence of pericardial effusion. Mitral Valve: The mitral valve is normal in structure. No evidence of mitral valve regurgitation. No evidence of mitral valve stenosis. Tricuspid Valve: The tricuspid valve is normal in structure. Tricuspid valve regurgitation is not demonstrated. No evidence of tricuspid stenosis. Aortic Valve: The aortic valve is tricuspid. There is mild calcification of the aortic valve. Aortic valve regurgitation is not visualized. Mild aortic valve sclerosis is present, with no evidence of aortic valve stenosis. Pulmonic Valve: The pulmonic valve was normal in structure. Pulmonic valve regurgitation is not visualized. No evidence of pulmonic stenosis. Aorta: The aortic root is normal in size and structure and aortic dilatation noted. There is severe dilatation of the aortic root, measuring 46 mm. There is dilatation of the ascending aorta, measuring 45 mm. Venous: The inferior vena cava is normal in size with less than 50% respiratory variability, suggesting right atrial pressure of 8 mmHg. IAS/Shunts: No  atrial level shunt detected by color flow Doppler. Additional Comments: RV size and function worse compared to TTE 08/16/20. Aorta remains signficantly dilated compared to TTE 08/16/20.  LEFT VENTRICLE PLAX 2D LVIDd:         3.30 cm LVIDs:         2.40 cm LV PW:         1.00 cm LV IVS:        0.70 cm LVOT diam:     2.30 cm LV SV:         57 LV SV Index:   28 LVOT Area:     4.15 cm  RIGHT VENTRICLE RV S prime:     7.72 cm/s TAPSE (M-mode): 1.5 cm LEFT ATRIUM           Index       RIGHT ATRIUM           Index LA diam:       2.20 cm 1.09 cm/m  RA Area:     17.20 cm LA Vol (A4C): 35.0 ml 17.27 ml/m RA Volume:   49.30 ml  24.32 ml/m  AORTIC VALVE LVOT Vmax:   94.60 cm/s LVOT Vmean:  70.300 cm/s LVOT VTI:    0.136 m  AORTA Ao Root diam: 4.60 cm Ao Asc diam:  4.50 cm TRICUSPID VALVE TR Peak grad:   44.9 mmHg TR Vmax:        335.00 cm/s  SHUNTS Systemic VTI:  0.14 m Systemic Diam: 2.30 cm Jenkins Rouge MD Electronically signed by Jenkins Rouge MD Signature Date/Time: 10/27/2020/10:27:19 AM    Final     Procedures .Critical Care Performed by: Hayden Rasmussen, MD Authorized by: Hayden Rasmussen, MD   Critical care provider statement:    Critical care time (minutes):  45   Critical care time was exclusive of:  Separately billable procedures and treating other patients   Critical care was necessary to treat or prevent imminent or life-threatening deterioration of the following conditions:  Respiratory failure   Critical care was time spent personally by me on the following activities:  Discussions with consultants, evaluation of patient's response to treatment, examination of patient, ordering and performing treatments and interventions, ordering and review of laboratory studies, ordering and review of radiographic studies, pulse oximetry, re-evaluation of patient's condition, obtaining history from patient or surrogate, review of old charts and development of treatment plan with patient or surrogate   Medications Ordered in ED Medications  cefTRIAXone (ROCEPHIN) 1 g in sodium chloride 0.9 % 100 mL IVPB (has no administration in time range)  azithromycin (ZITHROMAX) 500 mg in sodium chloride 0.9 % 250 mL IVPB (has no administration in time range)  ipratropium-albuterol (DUONEB) 0.5-2.5 (3) MG/3ML nebulizer solution 3 mL (3 mLs Nebulization Given 10/27/20 0800)  budesonide (PULMICORT) nebulizer solution 0.25 mg (0.25 mg Nebulization Given 10/27/20 0800)  albuterol (PROVENTIL) (2.5 MG/3ML) 0.083% nebulizer solution 2.5 mg  (has no administration in time range)  docusate sodium (COLACE) capsule 100 mg (has no administration in time range)  polyethylene glycol (MIRALAX / GLYCOLAX) packet 17 g (has no administration in time range)  dextrose 5 %-0.9 % sodium chloride infusion ( Intravenous Rate/Dose Change 10/27/20 0904)  acetaminophen (TYLENOL) tablet 650 mg (has no administration in time range)  ondansetron (ZOFRAN) injection 4 mg (has no administration in time range)  aspirin chewable tablet 81 mg (81 mg Oral Given 10/27/20 0907)  atorvastatin (LIPITOR) tablet 10 mg (10 mg Oral Given 10/27/20 0907)  nortriptyline (PAMELOR) capsule 75 mg (0 mg Oral Hold 10/08/2020 2303)  rOPINIRole (REQUIP) tablet 2 mg (2 mg Oral Not Given 10/21/2020 2303)  pantoprazole (PROTONIX) injection 40 mg (40 mg Intravenous Given 10/27/20 0855)  chlorhexidine (PERIDEX) 0.12 % solution 15 mL (0 mLs Mouth Rinse Hold 10/27/20 0910)  MEDLINE mouth rinse (has no administration in time range)  Chlorhexidine Gluconate Cloth 2 % PADS 6 each (6 each Topical Given by Other 10/27/20 0904)  feeding supplement (ENSURE ENLIVE / ENSURE PLUS) liquid 237 mL (237 mLs Oral Not Given 10/27/20 0907)  fentaNYL (SUBLIMAZE) injection 25-100 mcg (100 mcg Intravenous Given 10/27/20 0751)  metoprolol tartrate (LOPRESSOR) injection 5 mg (5 mg Intravenous Given 10/27/20 0855)  methylPREDNISolone sodium succinate (SOLU-MEDROL) 40 mg/mL injection 40 mg (40 mg Intravenous Given 10/27/20 0903)  fentaNYL (DURAGESIC) 50 MCG/HR 1 patch (1 patch Transdermal Patch Applied 10/27/20 0916)  heparin ADULT infusion 100 units/mL (25000 units/272mL) (1,150 Units/hr Intravenous Rate/Dose Change 10/27/20 0904)  magnesium sulfate IVPB 2 g 50 mL (0 g Intravenous Stopped 10/25/2020 1956)  methylPREDNISolone sodium succinate (SOLU-MEDROL) 125 mg/2 mL injection 125 mg (125 mg Intravenous Given 10/09/2020 1838)  albuterol (PROVENTIL) (2.5 MG/3ML) 0.083% nebulizer solution (5 mg  Given 10/25/2020 1837)  sodium chloride  0.9 % bolus 1,000 mL (0 mLs Intravenous Stopped 10/14/2020 2004)  cefTRIAXone (ROCEPHIN) 1 g in sodium chloride 0.9 % 100 mL IVPB (0 g Intravenous Stopped 11/02/2020 2004)  azithromycin (ZITHROMAX) 500 mg in sodium chloride 0.9 % 250 mL IVPB (0 mg Intravenous Stopped 10/20/2020 2030)  morphine 2 MG/ML injection 2 mg (2 mg Intravenous Given 10/23/2020 2027)  aspirin chewable tablet 324 mg (324 mg Oral Given 10/27/2020 2227)    Or  aspirin suppository 300 mg ( Rectal See Alternative 10/22/2020 2227)  sodium chloride 0.9 % bolus 1,000 mL (0 mLs Intravenous Stopped 10/27/20 0015)  morphine 2 MG/ML injection 2 mg (2 mg Intravenous Given 10/16/2020 2254)  iohexol (OMNIPAQUE) 350 MG/ML injection 80 mL (80 mLs Intravenous Contrast Given 10/09/2020 2158)  ALPRAZolam (XANAX) tablet 0.25 mg (0.25 mg Oral Given 10/27/20 0907)  sodium chloride 0.9 % bolus 1,000 mL (1,000 mLs Intravenous New Bag/Given 10/27/20 0359)  furosemide (LASIX) injection 40 mg (40 mg Intravenous Given 10/27/20 0855)  heparin bolus via infusion 2,300 Units (2,300 Units Intravenous Bolus from Bag 10/27/20 0905)    ED Course  I have reviewed the triage vital signs and the nursing notes.  Pertinent labs & imaging results that were available during my care of the patient were reviewed by me and considered in my medical decision making (see chart for details).  Clinical Course as of 10/27/20 1028  Fri Oct 26, 2020  1925 Discussed with critical care who will come down and evaluate the patient. [MB]  2004 Troponin and lactate elevated.  This probably represents strain and work of breathing.  Will cover with some fluids and antibiotics though.  Not having any chest pain so we will hold off on heparin. [MB]  2043 Patient was seen by intensivist Dr. Carson Myrtle who will admit the patient. [MB]    Clinical Course User Index [MB] Hayden Rasmussen, MD   MDM Rules/Calculators/A&P                          Paul Roth was evaluated in Emergency Department on  11/02/2020 for the symptoms described in the history of present illness. He was evaluated in the context of the global COVID-19 pandemic, which  necessitated consideration that the patient might be at risk for infection with the SARS-CoV-2 virus that causes COVID-19. Institutional protocols and algorithms that pertain to the evaluation of patients at risk for COVID-19 are in a state of rapid change based on information released by regulatory bodies including the CDC and federal and state organizations. These policies and algorithms were followed during the patient's care in the ED.  This patient complains of respiratory distress shortness of breath intractable pain; this involves an extensive number of treatment Options and is a complaint that carries with it a high risk of complications and Morbidity. The differential includes cancer progression, pneumothorax, PE, pneumonia, lobar collapse, dehydration  I ordered, reviewed and interpreted labs, which included CBC with elevated white count normal, chemistries with low sodium question paraneoplastic syndrome, troponins elevated, VBG without significant acidosis, lactate elevated and rising blood culture sent I ordered medication IV magnesium and steroids, IV antibiotics, IV fluids and pain medicine I ordered imaging studies which included chest x-ray and I independently    visualized and interpreted imaging which showed left upper lobe density Additional history obtained from patient's wife Previous records obtained and reviewed in epic, prior oncology notes.  Patient sounds significantly compensated from prior visit I consulted critical care Dr. Carson Myrtle and discussed lab and imaging findings  Critical Interventions: Aggressive management of patient's respiratory distress with steroids antibiotics magnesium BiPAP  After the interventions stated above, I reevaluated the patient and found patient still to be significantly tachypneic although work of  breathing seems improved.  You will need to be admitted to the hospital for further management.  Patient agreeable to plan.   Final Clinical Impression(s) / ED Diagnoses Final diagnoses:  Respiratory distress  Malignant neoplasm of lung, unspecified laterality, unspecified part of lung ([place])  Troponin level elevated    Rx / DC Orders ED Discharge Orders     None        Hayden Rasmussen, MD 10/27/20 1036

## 2020-10-26 NOTE — H&P (Addendum)
NAME:  Paul Roth MRN:  [id number] DOB:  12-18-1950 LOS: 0 ADMISSION DATE:  10/23/2020 DATE OF SERVICE:  10/27/2020  CHIEF COMPLAINT:  dyspnea   HISTORY & PHYSICAL  History of Present Illness  This 70 y.o. Caucasian male reformed smoker presented to [facility] Emergency Department via EMS with complaints of progressively worsening dyspnea for the past 1.5 days. EMS treated the patient with abluterol 10 mg neb, Atrovent 1 mg neb, SoluMedrol 125 mg IV and magnesium 2 g IV without significant improvement.  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  He was scheduled to start chemotherapy on 10/28/2020 and has not yet undergone any antineoplastic therapy at this time.  The patient's wife adds that the development of symptoms does seem to correlate with recent doctor's visits and meetings with care coordinator, which have increased the patient's psychological stress.  In the ER, the patient has been tachycardic and tachypneic.  SpO2 in the 70s on room air and has been requiring a non-rebreather mask to maintain SpO2 90%.  Lab work has revealed elevated troponin and lactate.  EKG shows sinus tachycardia with old inferior infarct but no definite acute sinister changes.  PCCM asked to admit.  REVIEW OF SYSTEMS  Constitutional: Weight loss. No night sweats. No fever. No chills. No fatigue. HEENT: No headaches, dysphagia, sore throat, otalgia, nasal congestion, PND CV:  Palpitations. No chest pain, orthopnea, PND, swelling in lower extremities. GI:  No abdominal pain, nausea, vomiting, diarrhea, change in bowel pattern, anorexia Resp: Dyspnea at rest, tachypnea, pleuritic pain, tan and greenish sputum production (chronic).  No hemoptysis, wheezing  GU: no dysuria, change in color of urine, no urgency or frequency.  No flank pain. MS:  No joint pain or swelling. No myalgias,  No decreased range of motion.  Psych:  Anxious. No  change in mood or affect. No memory loss. Skin: Multiple soft nodular lesions.   Past Medical/Surgical/Social/Family History   Past Medical History:  Diagnosis Date   Arthritis    Dyspnea    Hyperlipidemia    Tinnitus     Past Surgical History:  Procedure Laterality Date   BRONCHIAL BIOPSY  09/25/2020   Procedure: BRONCHIAL BIOPSIES;  Surgeon: [name], MD;  Location: [facility];  Service: Pulmonary;;   BRONCHIAL BRUSHINGS  09/25/2020   Procedure: BRONCHIAL BRUSHINGS;  Surgeon: [name], MD;  Location: [facility];  Service: Pulmonary;;   BRONCHIAL WASHINGS  09/25/2020   Procedure: BRONCHIAL WASHINGS;  Surgeon: [name], MD;  Location: [facility];  Service: Pulmonary;;   ENDOBRONCHIAL ULTRASOUND N/A 09/25/2020   Procedure: ENDOBRONCHIAL ULTRASOUND;  Surgeon: [name], MD;  Location: [facility];  Service: Pulmonary;  Laterality: N/A;   EYE SURGERY     TOTAL SHOULDER ARTHROPLASTY Right 07/26/2020   Procedure: TOTAL SHOULDER ARTHROPLASTY;  Surgeon: Tania Ade, MD;  Location: [facility];  Service: Orthopedics;  Laterality: Right;   VIDEO BRONCHOSCOPY N/A 09/25/2020   Procedure: VIDEO BRONCHOSCOPY WITHOUT FLUORO;  Surgeon: [name], MD;  Location: [facility];  Service: Pulmonary;  Laterality: N/A;    Social History   Tobacco Use   Smoking status: Former    Packs/day: 1.00    Years: 24.00    Pack years: 24.00    Types: Cigarettes    Quit date: 1990    Years since quitting: 32.7   Smokeless tobacco: Never  Substance Use Topics   Alcohol use: Yes  Comment: rare    Family History  Problem Relation Age of Onset   Lung disease Mother    Lung disease Maternal Aunt      Procedures:     Significant Diagnostic Tests:     Micro Data:   Results for orders placed or performed during the hospital encounter of 10/18/2020  Resp Panel by RT-PCR (Flu A&B, Covid) Nasopharyngeal Swab     Status: None   Collection Time: 10/20/2020  6:27 PM    Specimen: Nasopharyngeal Swab; Nasopharyngeal(NP) swabs in vial transport medium  Result Value Ref Range Status   SARS Coronavirus 2 by RT PCR NEGATIVE NEGATIVE Final    Comment: (NOTE) SARS-CoV-2 target nucleic acids are NOT DETECTED.  The SARS-CoV-2 RNA is generally detectable in upper respiratory specimens during the acute phase of infection. The lowest concentration of SARS-CoV-2 viral copies this assay can detect is 138 copies/mL. A negative result does not preclude SARS-Cov-2 infection and should not be used as the sole basis for treatment or other patient management decisions. A negative result may occur with  improper specimen collection/handling, submission of specimen other than nasopharyngeal swab, presence of viral mutation(s) within the areas targeted by this assay, and inadequate number of viral copies(<138 copies/mL). A negative result must be combined with clinical observations, patient history, and epidemiological information. The expected result is Negative.  Fact Sheet for Patients:  EntrepreneurPulse.com.au  Fact Sheet for Healthcare Providers:  IncredibleEmployment.be  This test is no t yet approved or cleared by the [place] FDA and  has been authorized for detection and/or diagnosis of SARS-CoV-2 by FDA under an Emergency Use Authorization (EUA). This EUA will remain  in effect (meaning this test can be used) for the duration of the COVID-19 declaration under Section 564(b)(1) of the Act, 21 U.S.C.section 360bbb-3(b)(1), unless the authorization is terminated  or revoked sooner.       Influenza A by PCR NEGATIVE NEGATIVE Final   Influenza B by PCR NEGATIVE NEGATIVE Final    Comment: (NOTE) The Xpert Xpress SARS-CoV-2/FLU/RSV plus assay is intended as an aid in the diagnosis of influenza from Nasopharyngeal swab specimens and should not be used as a sole basis for treatment. Nasal washings and aspirates are  unacceptable for Xpert Xpress SARS-CoV-2/FLU/RSV testing.  Fact Sheet for Patients: EntrepreneurPulse.com.au  Fact Sheet for Healthcare Providers: IncredibleEmployment.be  This test is not yet approved or cleared by the [place] FDA and has been authorized for detection and/or diagnosis of SARS-CoV-2 by FDA under an Emergency Use Authorization (EUA). This EUA will remain in effect (meaning this test can be used) for the duration of the COVID-19 declaration under Section 564(b)(1) of the Act, 21 U.S.C. section 360bbb-3(b)(1), unless the authorization is terminated or revoked.  Performed at [facility], [place] [address] [place], [place] [address]       Antimicrobials:  Rocephin/azithromycin (9/23>>)    Interim history/subjective:     Objective   BP 116/75   Pulse (!) 117   Temp 99.7 F (37.6 C) (Rectal)   Resp (!) 32   Ht 6\' 3"  (1.905 m)   Wt 77.1 kg   SpO2 97%   BMI 21.25 kg/m     Filed Weights   10/14/2020 1825  Weight: 77.1 kg   No intake or output data in the 24 hours ending 10/19/2020 1940  FiO2 (%):  [100 %] 100 %   Examination: GENERAL: alert, oriented to time, person and place, cachectic, anxious.  HEAD: normocephalic, atraumatic  EYE: PERRLA, EOM intact, no scleral icterus, no pallor. NOSE: nares are patent. No polyps. No exudate. No sinus tenderness. THROAT/ORAL CAVITY: Normal dentition. No oral thrush. No exudate. Mucous membranes are dry. No tonsillar enlargement.  NECK: supple, no thyromegaly, no JVD, no lymphadenopathy. Trachea midline. CHEST/LUNG: symmetric in development and expansion. Good air entry. Scattered crackles. No wheezes.  Tachypnea. HEART: Regular S1 and S2 without murmur, rub or gallop.  Tachycardia. ABDOMEN: soft, nontender, nondistended. Normoactive bowel sounds. No rebound. No guarding. No hepatosplenomegaly. EXTREMITIES: Edema: none. No cyanosis. Clubbing. 2+ DP  pulses LYMPHATIC: no cervical/axillary/inguinal lymph nodes appreciated MUSCULOSKELETAL: No point tenderness. Bulk atrophy. Joints: normal inspection. SKIN:  Numerous soft nodular lesions (back, abdomen, R shoulder).  No pressure sore. NEUROLOGIC: Doll's eyes intact. Corneal reflex intact. Spontaneous respirations intact. Cranial nerves II-XII are grossly symmetric and physiologic. Babinski absent. No sensory deficit. Motor: 5/5 @ RUE, 5/5 @ LUE, 5/5 @ RLL,  5/5 @ LLL.  DTR: 2+ @ R biceps, 2+ @ L biceps, 2+ @ R patellar,  2+ @ L patellar. No cerebellar signs. Gait was not assessed.   Resolved Hospital Problem list      Assessment & Plan:   ASSESSMENT/PLAN:  ASSESSMENT (included in the Hospital Problem List)  Active Problems:   Dyspnea   Acute hypoxemic respiratory failure (HCC)   Adenocarcinoma of left lung, stage 4 (HCC)   Cancer associated pain   Elevated troponin   Complex renal cyst   Ascending aortic aneurysm (HCC)   By systems: PULMONARY Acute hypoxemic respiratory failure Stage IV adenocarcinoma of the lung Titrate supplemental oxygen to maintain SpO2 93+% CTA chest to rule out pulmonary embolism (despite ongoing Eliquis therapy) Given his history of venous thromboembolism and currently elevated troponin (304), will start heparin.  Hold Eliquis. 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  Check Uosm, Sosm Check TSH IV fluids: D5NS 1 L over 2 hrs followed by NS @ 75 mL/hr Monitor urine output.  May need Foley for strict I/O.   GASTROINTESTINAL: No acute issues GI PROPHYLAXIS: Protonix    HEMATOLOGIC/ONCOLOGIC Stage IV adenocarcinoma of the lung Leukocytosis, unclear etiology, likely steroid-induced DVT PROPHYLAXIS: On Eliquis, changing to heparin gtt Oncology consult.  May also need palliative care.   INFECTIOUS Community-acquired pneumonia Rocephin/azithromycin   ENDOCRINE: No acute issues   NEUROLOGIC Cancer pain Morphine 2 mg q 4 hr scheduled.  Hold dose for any decrease in mental status. Continue nortriptyline q HS Continue Requip   PLAN/RECOMMENDATIONS  Admit to ICU under my service (Attending: Renee Pain, MD) with the diagnoses highlighted above in the active Hospital Problem List (ASSESSMENT). See above NUTRITION: regular    My assessment, plan of care, findings, medications, side effects, etc. were discussed with: nurse, patient (answered all questions to patient's satisfaction), and patient's next of kin (answered all questions to his/her satisfaction).   Best practice:  Diet: regular Pain/Anxiety/Delirium protocol (if indicated): N/A VAP protocol (if indicated): N/A DVT prophylaxis: heparin GI prophylaxis: Protonix Glucose control: N/A Mobility/Activity: bedrest for now    Code Status: Full Code Family Communication:   spouse updated at bedside. Disposition: admit to ICU.   Labs   CBC: Recent Labs  Lab  10/22/20 0953 10/04/2020 1826  WBC 11.5* 16.2*  NEUTROABS 8.2* 12.9*  HGB 14.9 16.9  HCT 42.7 49.2  MCV 87.3 87.7  PLT 219 295    Basic Metabolic Panel: Recent Labs  Lab 10/22/20 0953 11/01/2020 1826  NA 135 124*  K 3.9 4.4  CL 98 86*  CO2 23 22  GLUCOSE 87 135*  BUN 10 11  CREATININE 0.88 0.95  CALCIUM 9.5 9.1   GFR: Estimated Creatinine Clearance: 78.9 mL/min (by C-G formula based on SCr of 0.95 mg/dL). Recent Labs  Lab 10/22/20 0953 10/17/2020 1826  WBC 11.5* 16.2*  LATICACIDVEN  --  2.7*    Liver Function Tests: Recent Labs  Lab 10/22/20 0953 10/04/2020 1826  AST 20 27  ALT 25 19  ALKPHOS 87 107  BILITOT  0.6 1.3*  PROT 6.7 7.4  ALBUMIN 3.1* 3.5   No results for input(s): LIPASE, AMYLASE in the last 168 hours. No results for input(s): AMMONIA in the last 168 hours.  ABG    Component Value Date/Time   HCO3 23.6 10/04/2020 1841   ACIDBASEDEF 1.4 11/02/2020 1841   O2SAT 33.1 10/21/2020 1841     Coagulation Profile: Recent Labs  Lab 10/04/2020 1826  INR 1.4*    Cardiac Enzymes: No results for input(s): CKTOTAL, CKMB, CKMBINDEX, TROPONINI in the last 168 hours.  HbA1C: No results found for: HGBA1C  CBG: No results for input(s): GLUCAP in the last 168 hours.   Past Medical History   Past Medical History:  Diagnosis Date   Arthritis    Dyspnea    Hyperlipidemia    Tinnitus       Surgical History    Past Surgical History:  Procedure Laterality Date   BRONCHIAL BIOPSY  09/25/2020   Procedure: BRONCHIAL BIOPSIES;  Surgeon: [name], MD;  Location: [facility];  Service: Pulmonary;;   BRONCHIAL BRUSHINGS  09/25/2020   Procedure: BRONCHIAL BRUSHINGS;  Surgeon: [name], MD;  Location: [facility];  Service: Pulmonary;;   BRONCHIAL WASHINGS  09/25/2020   Procedure: BRONCHIAL WASHINGS;  Surgeon: [name], MD;  Location: [facility];  Service: Pulmonary;;   ENDOBRONCHIAL ULTRASOUND N/A 09/25/2020   Procedure: ENDOBRONCHIAL ULTRASOUND;  Surgeon: [name], MD;  Location: [facility];  Service: Pulmonary;  Laterality: N/A;   EYE SURGERY     TOTAL SHOULDER ARTHROPLASTY Right 07/26/2020   Procedure: TOTAL SHOULDER ARTHROPLASTY;  Surgeon: Tania Ade, MD;  Location: [facility];  Service: Orthopedics;  Laterality: Right;   VIDEO BRONCHOSCOPY N/A 09/25/2020   Procedure: VIDEO BRONCHOSCOPY WITHOUT FLUORO;  Surgeon: [name], MD;  Location: [facility];  Service: Pulmonary;  Laterality: N/A;      Social History   Social History   Socioeconomic History   Marital status: Married    Spouse name: Not on file   Number of children: Not on file    Years of education: Not on file   Highest education level: Not on file  Occupational History   Not on file  Tobacco Use   Smoking status: Former    Packs/day: 1.00    Years: 24.00    Pack years: 24.00    Types: Cigarettes    Quit date: 44    Years since quitting: 32.7   Smokeless tobacco: Never  Vaping Use   Vaping Use: Never used  Substance and Sexual Activity   Alcohol use: Yes    Comment: rare   Drug use: Not Currently    Types: Marijuana  Comment: hx of years ago   Sexual activity: Not on file  Other Topics Concern   Not on file  Social History Narrative   Lives with wife   Social Determinants of Health   Financial Resource Strain: Not on file  Food Insecurity: Not on file  Transportation Needs: Not on file  Physical Activity: Not on file  Stress: Not on file  Social Connections: Not on file      Family History    Family History  Problem Relation Age of Onset   Lung disease Mother    Lung disease Maternal Aunt    family history includes Lung disease in his maternal aunt and mother.    Allergies No Known Allergies    Current Medications  Current Facility-Administered Medications:    albuterol (PROVENTIL,VENTOLIN) solution continuous neb, 5 mg/hr, Nebulization, Once, Hayden Rasmussen, MD   azithromycin [facility]) 500 mg in sodium chloride 0.9 % 250 mL IVPB, 500 mg, Intravenous, Once, Hayden Rasmussen, MD, Last Rate: 250 mL/hr at 10/21/2020 1930, 500 mg at 10/19/2020 1930   cefTRIAXone (ROCEPHIN) 1 g in sodium chloride 0.9 % 100 mL IVPB, 1 g, Intravenous, Once, Hayden Rasmussen, MD, Last Rate: 200 mL/hr at 10/14/2020 1930, 1 g at 10/10/2020 1930   magnesium sulfate IVPB 2 g 50 mL, 2 g, Intravenous, Once, Hayden Rasmussen, MD, Last Rate: 50 mL/hr at 10/30/2020 1846, 2 g at 10/13/2020 1846  Current Outpatient Medications:    albuterol (VENTOLIN HFA) 108 (90 Base) MCG/ACT inhaler, Inhale 2 puffs into the lungs every 6 (six) hours as needed for wheezing or  shortness of breath., Disp: 8 g, Rfl: 6   apixaban (ELIQUIS) 5 MG TABS tablet, Take 1 tablet (5 mg total) by mouth 2 (two) times daily., Disp: 60 tablet, Rfl: 5   Ascorbic Acid (VITAMIN C) 1000 MG tablet, Take 1,000 mg by mouth daily., Disp: , Rfl:    atorvastatin (LIPITOR) 10 MG tablet, Take 10 mg by mouth daily., Disp: , Rfl:    Cholecalciferol (DIALYVITE VITAMIN D 5000) 125 MCG (5000 UT) capsule, Take 5,000 Units by mouth daily., Disp: , Rfl:    dexamethasone (DECADRON) 4 MG tablet, 1 tablet p.o. twice daily the day before, day of and day after chemotherapy every 3 weeks, Disp: 10 tablet, Rfl: 0   fluticasone (FLONASE) 50 MCG/ACT nasal spray, Place 2 sprays into both nostrils daily., Disp: , Rfl:    folic acid (FOLVITE) 1 MG tablet, Take 1 tablet (1 mg total) by mouth daily., Disp: 30 tablet, Rfl: 4   MAGNESIUM PO, Take 175 mg by mouth daily., Disp: , Rfl:    nortriptyline (PAMELOR) 75 MG capsule, Take 75 mg by mouth at bedtime., Disp: , Rfl:    pantoprazole (PROTONIX) 40 MG tablet, Take 1 tablet (40 mg total) by mouth 2 (two) times daily before a meal., Disp: 60 tablet, Rfl: 2   prochlorperazine (COMPAZINE) 10 MG tablet, Take 1 tablet (10 mg total) by mouth every 6 (six) hours as needed for nausea or vomiting., Disp: 30 tablet, Rfl: 0   RESVERATROL PO, Take 1 capsule by mouth daily., Disp: , Rfl:    rOPINIRole (REQUIP) 2 MG tablet, Take 1 tablet (2 mg total) by mouth at bedtime., Disp: 30 tablet, Rfl: 1   traMADol (ULTRAM) 50 MG tablet, Take 1 tablet (50 mg total) by mouth every 6 (six) hours as needed., Disp: 30 tablet, Rfl: 0   trolamine salicylate (ASPERCREME) 10 % cream, Apply 1 application  topically as needed for muscle pain., Disp: , Rfl:    umeclidinium-vilanterol (ANORO ELLIPTA) 62.5-25 MCG/INH AEPB, Inhale 1 puff into the lungs daily., Disp: 1 each, Rfl: 5   Home Medications  Prior to Admission medications   Medication Sig Start Date End Date Taking? Authorizing Provider  albuterol  (VENTOLIN HFA) 108 (90 Base) MCG/ACT inhaler Inhale 2 puffs into the lungs every 6 (six) hours as needed for wheezing or shortness of breath. 09/12/20   Magdalen Spatz, NP  apixaban (ELIQUIS) 5 MG TABS tablet Take 1 tablet (5 mg total) by mouth 2 (two) times daily. 08/14/20 09/18/20  Shawna Clamp, MD  Ascorbic Acid (VITAMIN C) 1000 MG tablet Take 1,000 mg by mouth daily.    [provider]  atorvastatin (LIPITOR) 10 MG tablet Take 10 mg by mouth daily. 05/15/20   [provider]  Cholecalciferol (DIALYVITE VITAMIN D 5000) 125 MCG (5000 UT) capsule Take 5,000 Units by mouth daily.    [provider]  dexamethasone (DECADRON) 4 MG tablet 1 tablet p.o. twice daily the day before, day of and day after chemotherapy every 3 weeks 10/22/20   Curt Bears, MD  fluticasone [facility]) 50 MCG/ACT nasal spray Place 2 sprays into both nostrils daily.    [provider]  folic acid (FOLVITE) 1 MG tablet Take 1 tablet (1 mg total) by mouth daily. 10/22/20   Curt Bears, MD  MAGNESIUM PO Take 175 mg by mouth daily.    [provider]  nortriptyline (PAMELOR) 75 MG capsule Take 75 mg by mouth at bedtime. 06/23/20   [provider]  pantoprazole (PROTONIX) 40 MG tablet Take 1 tablet (40 mg total) by mouth 2 (two) times daily before a meal. 08/08/20   Shawna Clamp, MD  prochlorperazine (COMPAZINE) 10 MG tablet Take 1 tablet (10 mg total) by mouth every 6 (six) hours as needed for nausea or vomiting. 10/22/20   Curt Bears, MD  RESVERATROL PO Take 1 capsule by mouth daily.    [provider]  rOPINIRole (REQUIP) 2 MG tablet Take 1 tablet (2 mg total) by mouth at bedtime. 09/12/20 11/11/20  Magdalen Spatz, NP  traMADol (ULTRAM) 50 MG tablet Take 1 tablet (50 mg total) by mouth every 6 (six) hours as needed. 10/22/20   Curt Bears, MD  trolamine salicylate (ASPERCREME) 10 % cream Apply 1 application topically as needed for muscle pain.    [provider]  umeclidinium-vilanterol (ANORO ELLIPTA) 62.5-25 MCG/INH AEPB Inhale 1 puff into the lungs daily. 10/17/20   [name], MD      Critical care time: 60 minutes.  The treatment and management of the patient's condition was required based on the threat of imminent deterioration. This time reflects time spent by the physician evaluating, providing care and managing the critically ill patient's care. The time was spent at the immediate bedside (or on the same floor/unit and dedicated to this patient's care). Time involved in separately billable procedures is NOT included int he critical care time indicated above. Family meeting and update time may be included above if and only if the patient is unable/incompetent to participate in clinical interview and/or decision making, and the discussion was necessary to determining treatment decisions.   Renee Pain, MD Board Certified by the ABIM, [place]

## 2020-10-26 NOTE — ED Triage Notes (Signed)
Pt came from home via EMS. Dx w/ lung cancer x 3 months ago. Pt has tx scheduled next week. C/c: shob x 1.5 days. Rhonchi on left side. 22 in left hand. EMS gave 10 mg albuterol, 1 mg atrovent, 125 mg solumedrol, 2 g Mg. Pt denies fall at home.

## 2020-10-26 NOTE — Progress Notes (Signed)
ABG attempted by 2 RT's and was unsuccessful. CCM MD made aware.

## 2020-10-26 NOTE — Progress Notes (Signed)
Called pt to introduce myself as his Arboriculturist.  Pt has 2 insurances so copay assistance shouldn't be needed.  I informed him of the [employer], went over what it covers and gave him the income requirement.  Pt would like to discuss the income with his wife and meet me on 10/29/20 if he would like to apply.  I will also give him my card for any questions or concerns he may have in the future.

## 2020-10-26 NOTE — Progress Notes (Signed)
RT placed patient on BIPAP per order from MD physcian. Patient pulling high volumes in the 1700's. CCM physician came to the bedside and asked this RT to place patient on CPAP mode per verbal order. Patient was unable to tolerate so This RT placed patient back on BIPAP per CCM verbal order.Patient is now on 100% nonrebreather due to patient unable to tolerate BIPAP. ABG will be done at 2100 per CCM verbal order to see where patient is.

## 2020-10-26 NOTE — Progress Notes (Signed)
eLink Physician-Brief Progress Note Patient Name: Paul Roth DOB: 13-Aug-1950 MRN: [id number]   Date of Service  10/12/2020  HPI/Events of Note  Significant amount of pain not controlled by ordered Morphine 2 mg IV Q 4 hours PRN. Unable to take PO medications.   eICU Interventions  Plan: D/C Morphine IV. Fentanyl 25-100 mcg IV Q 2 hours PRN pain.      Intervention Category Major Interventions: Other:  Lysle Dingwall 10/12/2020, 11:07 PM

## 2020-10-26 NOTE — ED Notes (Signed)
RT called to place pt on bipap.

## 2020-10-26 NOTE — ED Notes (Signed)
Dr. Carson Myrtle at bedside. Verbal order for 2mg  IV morphine. Patient working hard to breathe, anxious.

## 2020-10-27 ENCOUNTER — Inpatient Hospital Stay (HOSPITAL_COMMUNITY): Payer: Medicare Other

## 2020-10-27 ENCOUNTER — Encounter (HOSPITAL_COMMUNITY): Payer: Self-pay | Admitting: Pulmonary Disease

## 2020-10-27 DIAGNOSIS — C3492 Malignant neoplasm of unspecified part of left bronchus or lung: Secondary | ICD-10-CM

## 2020-10-27 DIAGNOSIS — J9601 Acute respiratory failure with hypoxia: Secondary | ICD-10-CM | POA: Diagnosis not present

## 2020-10-27 DIAGNOSIS — J90 Pleural effusion, not elsewhere classified: Secondary | ICD-10-CM | POA: Diagnosis not present

## 2020-10-27 DIAGNOSIS — I214 Non-ST elevation (NSTEMI) myocardial infarction: Secondary | ICD-10-CM | POA: Diagnosis not present

## 2020-10-27 HISTORY — DX: Malignant neoplasm of unspecified part of left bronchus or lung: C34.92

## 2020-10-27 LAB — CBC
HCT: 45.1 % (ref 39.0–52.0)
Hemoglobin: 15.2 g/dL (ref 13.0–17.0)
MCH: 29.9 pg (ref 26.0–34.0)
MCHC: 33.7 g/dL (ref 30.0–36.0)
MCV: 88.8 fL (ref 80.0–100.0)
Platelets: 146 10*3/uL — ABNORMAL LOW (ref 150–400)
RBC: 5.08 MIL/uL (ref 4.22–5.81)
RDW: 12.7 % (ref 11.5–15.5)
WBC: 12.1 10*3/uL — ABNORMAL HIGH (ref 4.0–10.5)
nRBC: 0 % (ref 0.0–0.2)

## 2020-10-27 LAB — BLOOD GAS, ARTERIAL
Acid-base deficit: 5.8 mmol/L — ABNORMAL HIGH (ref 0.0–2.0)
Bicarbonate: 19 mmol/L — ABNORMAL LOW (ref 20.0–28.0)
FIO2: 60
O2 Saturation: 95.3 %
Patient temperature: 97.8
pCO2 arterial: 36.9 mmHg (ref 32.0–48.0)
pH, Arterial: 7.332 — ABNORMAL LOW (ref 7.350–7.450)
pO2, Arterial: 87.2 mmHg (ref 83.0–108.0)

## 2020-10-27 LAB — CORTISOL: Cortisol, Plasma: 51.6 ug/dL

## 2020-10-27 LAB — BASIC METABOLIC PANEL
Anion gap: 12 (ref 5–15)
BUN: 11 mg/dL (ref 8–23)
CO2: 19 mmol/L — ABNORMAL LOW (ref 22–32)
Calcium: 8.5 mg/dL — ABNORMAL LOW (ref 8.9–10.3)
Chloride: 95 mmol/L — ABNORMAL LOW (ref 98–111)
Creatinine, Ser: 0.96 mg/dL (ref 0.61–1.24)
GFR, Estimated: >60 mL/min (ref >60)
Glucose, Bld: 197 mg/dL — ABNORMAL HIGH (ref 70–99)
Potassium: 5.1 mmol/L (ref 3.5–5.1)
Sodium: 126 mmol/L — ABNORMAL LOW (ref 135–145)

## 2020-10-27 LAB — APTT
aPTT: 37 seconds — ABNORMAL HIGH (ref 24–36)
aPTT: 36 seconds (ref 24–36)
aPTT: 43 seconds — ABNORMAL HIGH (ref 24–36)

## 2020-10-27 LAB — SODIUM, URINE, RANDOM: Sodium, Ur: 73 mmol/L

## 2020-10-27 LAB — ECHOCARDIOGRAM COMPLETE
Height: 74 in
S' Lateral: 2.4 cm
Weight: 2716.07 oz

## 2020-10-27 LAB — MAGNESIUM: Magnesium: 2.4 mg/dL (ref 1.7–2.4)

## 2020-10-27 LAB — OSMOLALITY: Osmolality: 266 mOsm/kg — ABNORMAL LOW (ref 275–295)

## 2020-10-27 LAB — LACTIC ACID, PLASMA
Lactic Acid, Venous: 5.4 mmol/L (ref 0.5–1.9)
Lactic Acid, Venous: 3.6 mmol/L (ref 0.5–1.9)

## 2020-10-27 LAB — MRSA NEXT GEN BY PCR, NASAL: MRSA by PCR Next Gen: NOT DETECTED

## 2020-10-27 LAB — OSMOLALITY, URINE: Osmolality, Ur: 325 mOsm/kg (ref 300–900)

## 2020-10-27 MED ORDER — HEPARIN (PORCINE) 25000 UT/250ML-% IV SOLN
1400.0000 [IU]/h | INTRAVENOUS | Status: DC
  Administered 2020-10-27: 1400 [IU]/h via INTRAVENOUS

## 2020-10-27 MED ORDER — HEPARIN (PORCINE) 25000 UT/250ML-% IV SOLN: 1150.0000 [IU]/h | INTRAVENOUS | Status: DC

## 2020-10-27 MED ORDER — METHYLPREDNISOLONE SODIUM SUCC 40 MG IJ SOLR
40.0000 mg | Freq: Two times a day (BID) | INTRAMUSCULAR | Status: DC
  Administered 2020-10-27 – 2020-10-29 (×5): 40 mg via INTRAVENOUS
  Filled 2020-10-27 (×4): qty 1

## 2020-10-27 MED ORDER — FUROSEMIDE 10 MG/ML IJ SOLN
40.0000 mg | Freq: Once | INTRAMUSCULAR | Status: AC
  Administered 2020-10-27: 40 mg via INTRAVENOUS
  Filled 2020-10-27: qty 4

## 2020-10-27 MED ORDER — ORAL CARE MOUTH RINSE
15.0000 mL | Freq: Two times a day (BID) | OROMUCOSAL | Status: DC
  Administered 2020-10-28 – 2020-10-31 (×6): 15 mL via OROMUCOSAL

## 2020-10-27 MED ORDER — HEPARIN BOLUS VIA INFUSION
2000.0000 [IU] | Freq: Once | INTRAVENOUS | Status: AC
  Administered 2020-10-27: 2000 [IU] via INTRAVENOUS
  Filled 2020-10-27: qty 2000

## 2020-10-27 MED ORDER — HEPARIN BOLUS VIA INFUSION
2300.0000 [IU] | Freq: Once | INTRAVENOUS | Status: AC
  Administered 2020-10-27: 2300 [IU] via INTRAVENOUS
  Filled 2020-10-27: qty 2300

## 2020-10-27 MED ORDER — FENTANYL 100 MCG/HR TD PT72: 1.0000 | MEDICATED_PATCH | TRANSDERMAL | Status: DC

## 2020-10-27 MED ORDER — SODIUM CHLORIDE 0.9 % IV BOLUS
1000.0000 mL | Freq: Once | INTRAVENOUS | Status: AC
  Administered 2020-10-27: 1000 mL via INTRAVENOUS

## 2020-10-27 MED ORDER — PANTOPRAZOLE SODIUM 40 MG IV SOLR
40.0000 mg | Freq: Every day | INTRAVENOUS | Status: DC
  Administered 2020-10-27 – 2020-10-29 (×3): 40 mg via INTRAVENOUS
  Filled 2020-10-27 (×4): qty 40

## 2020-10-27 MED ORDER — FENTANYL 50 MCG/HR TD PT72
1.0000 | MEDICATED_PATCH | TRANSDERMAL | Status: DC
  Administered 2020-10-27: 1 via TRANSDERMAL
  Filled 2020-10-27: qty 1

## 2020-10-27 MED ORDER — ALPRAZOLAM 0.25 MG PO TABS
0.2500 mg | ORAL_TABLET | Freq: Once | ORAL | Status: AC
  Administered 2020-10-27: 0.25 mg via ORAL
  Filled 2020-10-27 (×2): qty 1

## 2020-10-27 MED ORDER — METOPROLOL TARTRATE 5 MG/5ML IV SOLN
5.0000 mg | Freq: Four times a day (QID) | INTRAVENOUS | Status: DC
  Administered 2020-10-27 – 2020-10-29 (×8): 5 mg via INTRAVENOUS
  Filled 2020-10-27 (×10): qty 5

## 2020-10-27 NOTE — Progress Notes (Signed)
Echocardiogram 2D Echocardiogram has been performed.  Oneal Deputy Seba Madole RDCS 10/27/2020, 9:40 AM

## 2020-10-27 NOTE — Progress Notes (Addendum)
NAME:  Paul Roth MRN:  [id number] DOB:  [birth year] LOS: 1 ADMISSION DATE:  10/18/2020 DATE OF SERVICE:  10/23/2020  CHIEF COMPLAINT:  dyspnea   HISTORY & PHYSICAL  History of Present Illness  This 70 y.o. Caucasian male reformed smoker presented to [facility] Emergency Department via EMS with complaints of progressively worsening dyspnea for the past 1.5 days. EMS treated the patient with abluterol 10 mg neb, Atrovent 1 mg neb, SoluMedrol 125 mg IV and magnesium 2 g IV without significant improvement.  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  He was scheduled to start chemotherapy on 10/28/2020 and has not yet undergone any antineoplastic therapy at this time.  The patient's wife adds that the development of symptoms does seem to correlate with recent doctor's visits and meetings with care coordinator, which have increased the patient's psychological stress.  In the ER, the patient has been tachycardic and tachypneic.  SpO2 in the 70s on room air and has been requiring a non-rebreather mask to maintain SpO2 90%.  Lab work has revealed elevated troponin and lactate.  EKG shows sinus tachycardia with old inferior infarct but no definite acute sinister changes.  PCCM asked to admit.     Key studies/events:     Antimicrobials:  Rocephin/azithromycin (9/23>>)     Scheduled Meds:  ALPRAZolam  0.25 mg Oral Once   aspirin  81 mg Oral Daily   atorvastatin  10 mg Oral Daily   budesonide (PULMICORT) nebulizer solution  0.25 mg Nebulization BID   chlorhexidine  15 mL Mouth Rinse BID   Chlorhexidine Gluconate Cloth  6 each Topical Daily   feeding supplement  237 mL Oral BID BM   ipratropium-albuterol  3 mL Nebulization Q6H   mouth rinse  15 mL Mouth Rinse q12n4p   methylPREDNISolone (SOLU-MEDROL) injection  60 mg Intravenous Daily   nortriptyline  75 mg Oral QHS   rOPINIRole  2 mg Oral QHS   Continuous  Infusions:  azithromycin     cefTRIAXone (ROCEPHIN)  IV     dextrose 5 % and 0.9% NaCl 75 mL/hr at 10/27/20 0401   famotidine (PEPCID) IV 20 mg (10/16/2020 2226)   heparin 900 Units/hr (10/27/20 0401)   PRN Meds:.acetaminophen, albuterol, docusate sodium, fentaNYL (SUBLIMAZE) injection, ondansetron (ZOFRAN) IV, polyethylene glycol    Interim history/subjective:  Having trouble swallowing pills/ water but fentanyl IV prn working fine for a few hours   Objective   BP 127/90   Pulse 100   Temp 97.6 F (36.4 C) (Axillary)   Resp (!) 24   Ht 6\' 2"  (1.88 m)   Wt 77 kg   SpO2 95%   BMI 21.80 kg/m     Filed Weights   10/30/2020 1825 10/27/20 0000 10/27/20 0426  Weight: 77.1 kg 77 kg 77 kg    Intake/Output Summary (Last 24 hours) at 10/27/2020 0503 Last data filed at 10/27/2020 0401 Gross per 24 hour  Intake 2555.39 ml  Output --  Net 2555.39 ml    FiO2 (%):  [60 %-100 %] 60 %   Examination: Tmax 99.7  General appearance:    very anxious thin wm high wob  At Rest 02 sats  99% on bipap st  fio2 0.60  No jvd Oropharynx clear,  mucosa nl Neck supple Lungs with coarse bs bilaterally RRR no s3 or or sign murmur Abd nl excursion  Extr warm with no edema or clubbing noted Neuro  Sensorium intact,  no apparent motor deficits apparent     I personally reviewed images and agree with radiology impression as follows:  CXR portable 9/24 worsening RUL AS dz    Resolved Hospital Problem list      Assessment & Plan:   ASSESSMENT/PLAN:  ASSESSMENT (included in the Hospital Problem List)  Active Problems:   NSTEMI (non-ST elevated myocardial infarction) ([place])   Dyspnea   Adenocarcinoma of left lung, stage 4 ([place])   Cancer associated pain   Acute hypoxemic respiratory failure (HCC)   Elevated troponin   Complex renal cyst   Ascending aortic aneurysm ([place])   By systems: PULMONARY Acute hypoxemic respiratory failure in pt with underlying PF  Stage IV adenocarcinoma of the  lung with small L effusion Titrate supplemental oxygen to maintain SpO2 > 90% with bipap prn Aerosols: DuoNeb, Pulmicort, albuterol PRN Diuresis and hold further IV fluids  Rx as pna in pt with PF, worse since fluid boluses for Hyponatremia so rx lasix x 40mg  I V am 9/24     CARDIOVASCULAR Elevated troponins/NSTEMI Ascending aortic aneurysm - Echo 9/23 pending  Aspirin 81 mg daily  Cont heparin gtt  beta blockade as allowed  by bp  Continue Lipitor   RENAL Hyponatremia - TSH  9/23 nl - S osm 266  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  Check Uosm/Una pending  Lasix 40 mg IV as may be fluid overloaded now   GASTROINTESTINAL: dysphagia ? Etiology  GI PROPHYLAXIS: Protonix   HEMATOLOGIC/ONCOLOGIC Stage IV adenocarcinoma of the lung Leukocytosis, unclear etiology, likely steroid-induced DVT PROPHYLAXIS:   changed to heparin gtt 9/23 Oncology consult.   Needs  palliative care.   INFECTIOUS Community-acquired pneumonia - PCT only 0.15 on adm - BC x 2  10/24/2020 - MRSA PCR screen 9/23 neg - Viral resp panel  9/23 neg for covid, flu Rocephin/azithromycin 9/23 >>>    ENDOCRINE: No acute issues   NEUROLOGIC Cancer pain/ severe anxiety  Start fentanyl patch at 100 mcg as using 100 mcg IV q 2 is barely holding him        Best practice:  Diet: regular Pain/Anxiety/Delirium protocol (if indicated): N/A VAP protocol (if indicated): N/A DVT prophylaxis: heparin GI prophylaxis: Protonix Glucose control: N/A Mobility/Activity: bedrest for now    Code Status: Full Code Family Communication:   spouse updated at bedside. Disposition: admit to ICU.   Labs   CBC: Recent Labs  Lab 10/22/20 0953 10/18/2020 1826 10/27/20 0250  WBC 11.5* 16.2* 12.1*  NEUTROABS 8.2* 12.9*  --   HGB 14.9 16.9 15.2  HCT 42.7 49.2 45.1  MCV 87.3 87.7 88.8  PLT 219 195 146*    Basic Metabolic Panel: Recent Labs  Lab 10/22/20 0953 10/15/2020 1826 10/27/20 0250  NA 135 124* 126*  K 3.9 4.4 5.1  CL  98 86* 95*  CO2 23 22 19*  GLUCOSE 87 135* 197*  BUN 10 11 11   CREATININE 0.88 0.95 0.96  CALCIUM 9.5 9.1 8.5*  MG  --   --  2.4   GFR: Estimated Creatinine Clearance: 78 mL/min (by C-G formula based on SCr of 0.96 mg/dL). Recent Labs  Lab 10/22/20 0953 10/23/2020 1826 10/23/2020 2012 10/27/20 0250  PROCALCITON  --   --  0.15  --   WBC 11.5* 16.2*  --  12.1*  LATICACIDVEN  --  2.7* 3.2* 5.4*    Liver Function Tests: Recent Labs  Lab 10/22/20 0953 11/02/2020 1826  AST 20 27  ALT 25  19  ALKPHOS 87 107  BILITOT 0.6 1.3*  PROT 6.7 7.4  ALBUMIN 3.1* 3.5   No results for input(s): LIPASE, AMYLASE in the last 168 hours. No results for input(s): AMMONIA in the last 168 hours.  ABG    Component Value Date/Time   PHART 7.332 (L) 10/27/2020 0424   PCO2ART 36.9 10/27/2020 0424   PO2ART 87.2 10/27/2020 0424   HCO3 19.0 (L) 10/27/2020 0424   ACIDBASEDEF 5.8 (H) 10/27/2020 0424   O2SAT 95.3 10/27/2020 0424     Coagulation Profile: Recent Labs  Lab 10/19/2020 1826  INR 1.4*    Cardiac Enzymes: No results for input(s): CKTOTAL, CKMB, CKMBINDEX, TROPONINI in the last 168 hours.  HbA1C: No results found for: HGBA1C  CBG: No results for input(s): GLUCAP in the last 168 hours.    The patient is critically ill with multiple organ systems failure and requires high complexity decision making for assessment and support, frequent evaluation and titration of therapies, application of advanced monitoring technologies and extensive interpretation of multiple databases. Critical Care Time devoted to patient care services described in this note is 45 minutes.   Christinia Gully, MD Pulmonary and [place] [phone]   After 7:00 pm call Elink  [phone]

## 2020-10-27 NOTE — Progress Notes (Signed)
ANTICOAGULATION CONSULT NOTE - Initial Consult  Pharmacy Consult for Heparin Indication: chest pain/ACS  No Known Allergies  Patient Measurements: Height: 6\' 2"  (188 cm) Weight: 77 kg (169 lb 12.1 oz) IBW/kg (Calculated) : 82.2 Heparin Dosing Weight: 77 kg  Vital Signs: Temp: 96.3 F (35.7 C) (09/24 0815) Temp Source: Axillary (09/24 0815) BP: 116/76 (09/24 0500) Pulse Rate: 99 (09/24 0500)  Labs: Recent Labs    10/20/2020 1826 10/04/2020 2012 10/27/20 0250 10/27/20 0712  HGB 16.9  --  15.2  --   HCT 49.2  --  45.1  --   PLT 195  --  146*  --   APTT 31  --  37* 36  LABPROT 17.5*  --   --   --   INR 1.4*  --   --   --   HEPARINUNFRC >1.10*  --   --   --   CREATININE 0.95  --  0.96  --   TROPONINIHS 304* 294*  --   --      Estimated Creatinine Clearance: 78 mL/min (by C-G formula based on SCr of 0.96 mg/dL).   Medical History: Past Medical History:  Diagnosis Date   Arthritis    Dyspnea    Hyperlipidemia    Tinnitus     Medications:  Apixaban 5 mg bid PTA for h/o PE  Assessment: 70 y/o M with recent diagnosis of lung cancer not yet on chemotherapy and relatively recent h/o PE on Eliquis admitted with ShOB. Pharmacy consulted to initiate heparin infusion for ACS with elevated troponin.   Today, 10/27/2020 PTT subtherapeutic, 36 seconds on heparin 900 units/hr CBC: Hgb stable 15.2, Plts slightly decreased 146k No bleeding or interruptions with heparin infusion reported by RN  Goal of Therapy:  Heparin level 0.3-0.7 units/ml aPTT 66-102 seconds Monitor platelets by anticoagulation protocol: Yes   Plan:  Heparin 2300 units IV bolus Increase heparin infusion to 1150 units/hr aPTT in 8 hours Daily HL/aPTT/CBC until levels corelate  Peggyann Juba, PharmD, BCPS Pharmacy: [phone] 10/27/2020,8:44 AM

## 2020-10-27 NOTE — Progress Notes (Signed)
Gave PT nebulizer treatment through BiPAP (due to increased WOB and PT request). However, PT unable to tolerate BiPAP greater than 8 mins. PT requested to be removed from BiPAP and placed back on PRB. RN aware.

## 2020-10-27 NOTE — Progress Notes (Signed)
ABG collected called the lab.

## 2020-10-27 NOTE — Progress Notes (Addendum)
[facility] Progress Note Patient Name: Paul Roth DOB: August 15, 1950 MRN: [id number]   Date of Service  10/27/2020  HPI/Events of Note  Nursing concerned about anxiety.  eICU Interventions  Plan: Xanax 0.25 mg PO X 1 now. Attempt to retry BiPAP once patient given Xanax.         Nyellie Yetter Cornelia Copa 10/27/2020, 2:51 AM

## 2020-10-27 NOTE — Progress Notes (Signed)
Pharmacy Brief Note - Evening Anticoagulation Follow Up:  Patient is currently on heparin drip for chest pain/ACS, history of PE while apixaban is on hold. For full history, see note by Peggyann Juba, PharmD from earlier today.   Assessment: aPTT = 43 seconds remains subtherapeutic despite increasing rate of heparin to 1150 units/hr Confirmed with RN that heparin infusing at correct rate. No interruptions. No signs of bleeding.   Goal: aPTT 66-102 seconds AND HL 0.3-0.7  Plan: Heparin bolus 2000 units IV once Increase heparin infusion to 1400 units/hr Check 8 hour aPTT aPTT/HL, CBC daily while on heparin infusion. Once HL and aPTT correlate, can monitor using HL only Monitor for signs of bleeding  Lenis Noon, PharmD 10/27/20 5:46 PM

## 2020-10-27 NOTE — Progress Notes (Signed)
eLink Physician-Brief Progress Note Patient Name: Paul Roth DOB: Feb 13, 1950 MRN: [id number]   Date of Service  10/27/2020  HPI/Events of Note  Lactic acid = 5.4. Last LVEF = 55-60%.  eICU Interventions  Plan: Bolus with 0.9 NaCl 1 liter IV over 1 hour now. Repeat Lactic Acid at 8 AM.     Intervention Category Major Interventions: Acid-Base disturbance - evaluation and management  Paul Roth 10/27/2020, 3:47 AM

## 2020-10-28 DIAGNOSIS — J9601 Acute respiratory failure with hypoxia: Secondary | ICD-10-CM | POA: Diagnosis not present

## 2020-10-28 DIAGNOSIS — J90 Pleural effusion, not elsewhere classified: Secondary | ICD-10-CM | POA: Diagnosis not present

## 2020-10-28 LAB — APTT
aPTT: 47 seconds — ABNORMAL HIGH (ref 24–36)
aPTT: 51 seconds — ABNORMAL HIGH (ref 24–36)

## 2020-10-28 LAB — CBC
HCT: 41.6 % (ref 39.0–52.0)
Hemoglobin: 14.1 g/dL (ref 13.0–17.0)
MCH: 30.4 pg (ref 26.0–34.0)
MCHC: 33.9 g/dL (ref 30.0–36.0)
MCV: 89.7 fL (ref 80.0–100.0)
Platelets: 192 10*3/uL (ref 150–400)
RBC: 4.64 MIL/uL (ref 4.22–5.81)
RDW: 13.2 % (ref 11.5–15.5)
WBC: 28.7 10*3/uL — ABNORMAL HIGH (ref 4.0–10.5)
nRBC: 0 % (ref 0.0–0.2)

## 2020-10-28 LAB — HEPARIN LEVEL (UNFRACTIONATED): Heparin Unfractionated: >1.1 IU/mL — ABNORMAL HIGH (ref 0.30–0.70)

## 2020-10-28 MED ORDER — HEPARIN BOLUS VIA INFUSION
2000.0000 [IU] | Freq: Once | INTRAVENOUS | Status: AC
  Administered 2020-10-28: 2000 [IU] via INTRAVENOUS
  Filled 2020-10-28: qty 2000

## 2020-10-28 MED ORDER — FUROSEMIDE 10 MG/ML IJ SOLN
40.0000 mg | Freq: Once | INTRAMUSCULAR | Status: AC
  Administered 2020-10-28: 40 mg via INTRAVENOUS
  Filled 2020-10-28: qty 4

## 2020-10-28 MED ORDER — HEPARIN (PORCINE) 25000 UT/250ML-% IV SOLN
1650.0000 [IU]/h | INTRAVENOUS | Status: DC
  Administered 2020-10-28: 1650 [IU]/h via INTRAVENOUS
  Filled 2020-10-28: qty 250

## 2020-10-28 MED ORDER — DEXMEDETOMIDINE HCL IN NACL 200 MCG/50ML IV SOLN
0.1000 ug/kg/h | INTRAVENOUS | Status: DC
Start: 2020-10-28 — End: 2020-10-31
  Administered 2020-10-28: 0.1 ug/kg/h via INTRAVENOUS
  Administered 2020-10-28 – 2020-10-29 (×2): 0.4 ug/kg/h via INTRAVENOUS
  Administered 2020-10-29: 0.3 ug/kg/h via INTRAVENOUS
  Administered 2020-10-29: 0.4 ug/kg/h via INTRAVENOUS
  Administered 2020-10-30 (×2): 0.5 ug/kg/h via INTRAVENOUS
  Administered 2020-10-30: 0.6 ug/kg/h via INTRAVENOUS
  Administered 2020-10-30: 0.5 ug/kg/h via INTRAVENOUS
  Administered 2020-10-30 – 2020-10-31 (×3): 0.6 ug/kg/h via INTRAVENOUS
  Filled 2020-10-28 (×13): qty 50

## 2020-10-28 MED ORDER — FENTANYL 50 MCG/HR TD PT72: 2.0000 | MEDICATED_PATCH | TRANSDERMAL | Status: DC

## 2020-10-28 MED ORDER — ENOXAPARIN SODIUM 80 MG/0.8ML IJ SOSY
80.0000 mg | PREFILLED_SYRINGE | Freq: Two times a day (BID) | INTRAMUSCULAR | Status: DC
  Administered 2020-10-28 – 2020-10-29 (×2): 80 mg via SUBCUTANEOUS
  Filled 2020-10-28 (×4): qty 0.8

## 2020-10-28 MED ORDER — CLONAZEPAM 0.5 MG PO TBDP
0.5000 mg | ORAL_TABLET | Freq: Two times a day (BID) | ORAL | Status: DC
  Administered 2020-10-28 – 2020-10-29 (×5): 0.5 mg via ORAL
  Filled 2020-10-28 (×5): qty 1

## 2020-10-28 MED ORDER — ENSURE ENLIVE PO LIQD
237.0000 mL | Freq: Three times a day (TID) | ORAL | Status: DC
  Administered 2020-10-29: 237 mL via ORAL

## 2020-10-28 NOTE — Progress Notes (Signed)
Visiting PT for 1400 nebulizer treatment. PT had been eating and starting coughing which resulted in increased WOB and Sp02 lower than ordered goal. RT originally placed PT on NRB (from PRB). After observing PT, he still had some WOB and Sp02 lower than goal. RT placed [place] (non heated high flow nasal cannula under NRB at 6 LPM. PT now has less WOB and Sp02 93-94%. RN aware.

## 2020-10-28 NOTE — Progress Notes (Signed)
[facility] Progress Note Patient Name: Paul Roth DOB: [birth year] MRN: [id number]   Date of Service  10/28/2020  HPI/Events of Note  eLink camera rounds Patient seen on 100% NRBM and high flow nasal cannula not in acute distress using his phone.  eICU Interventions  Will continue to monitor     Intervention Category Intermediate Interventions: Other:  Judd Lien 10/28/2020, 9:21 PM

## 2020-10-28 NOTE — Progress Notes (Signed)
Initial Nutrition Assessment  DOCUMENTATION CODES:   Not applicable, suspect some degree of malnutrition is present but unable to confirm at this time  INTERVENTION:   - Given pt reporting difficulty swallowing, recommend obtaining SLP evaluation  - Increase Ensure Enlive po to TID, each supplement provides 350 kcal and 20 grams of protein  - Add Magic Cup TID with meals, each supplement provides 290 kcal and 9 grams of protein  NUTRITION DIAGNOSIS:   Inadequate oral intake related to dysphagia as evidenced by per patient/family report.  GOAL:   Patient will meet greater than or equal to 90% of their needs  MONITOR:   PO intake, Supplement acceptance, Labs, Weight trends, I & O's  REASON FOR ASSESSMENT:   Malnutrition Screening Tool    ASSESSMENT:   70 year old male who presented with SOB. 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  RD working remotely.  Pt meets criteria for SIADH. Per CCM note this morning, pt with high WOB when not on BiPAP. CCM recommending palliative care approach.  Pt currently on a Regular diet with no meal completions recorded. Per review of notes, pt reporting difficulty swallowing. Have reached out to RN via secure chat to find out if pt has been able to eat today and to see whether pt may require SLP evaluation.  Unable to reach pt via phone call to room; per O2 device column, pt on partial rebreather mask. Reviewed weight history in chart. Pt with a steady decline in weight since 07/17/20. Overall, pt has lost 7.3 kg since this date. This is an 8.5% weight loss in just over 3 months which is severe and significant for timeframe. Suspect pt with malnutrition but unable to confirm without diet history or NFPE.  Pt with Ensure Enlive ordered BID. RD to increase to TID. Will also order Magic Cups with meals. Recommend SLP evaluation if pt  continues to report difficulty swallowing.  Medications reviewed and include: Ensure Enlive BID, IV solu-medrol, IV protonix, IV abx, precedex gtt  Labs reviewed: sodium 126, lactic acid 3.6  UOP: 2450 ml x 24 hours I/O's: -653 ml since admit  NUTRITION - FOCUSED PHYSICAL EXAM:  Unable to confirm at this time. RD working remotely.  Diet Order:   Diet Order             Diet regular Room service appropriate? Yes; Fluid consistency: Thin  Diet effective now                   EDUCATION NEEDS:   Not appropriate for education at this time  Skin:  Skin Assessment: Reviewed RN Assessment  Last BM:  10/28/20  Height:   Ht Readings from Last 1 Encounters:  10/27/20 6\' 2"  (1.88 m)    Weight:   Wt Readings from Last 1 Encounters:  10/28/20 78.5 kg    BMI:  Body mass index is 22.22 kg/m.  Estimated Nutritional Needs:   Kcal:  1900-2100  Protein:  90-110 grams  Fluid:  2.0 L    Gustavus Bryant, MS, RD, LDN Inpatient Clinical Dietitian Please see AMiON for contact information.

## 2020-10-28 NOTE — Progress Notes (Signed)
NAME:  Paul Roth MRN:  [id number] DOB:  [birth year] LOS: 2 ADMISSION DATE:  10/23/2020 DATE OF SERVICE:  10/14/2020  CHIEF COMPLAINT:  dyspnea   HISTORY & PHYSICAL  History of Present Illness  This 70 y.o. Caucasian male reformed smoker presented to [facility] Emergency Department via EMS with complaints of progressively worsening dyspnea for the past 1.5 days. EMS treated the patient with abluterol 10 mg neb, Atrovent 1 mg neb, SoluMedrol 125 mg IV and magnesium 2 g IV without significant improvement.  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  He was scheduled to start chemotherapy on 10/28/2020 and has not yet undergone any antineoplastic therapy at this time.  The patient's wife adds that the development of symptoms does seem to correlate with recent doctor's visits and meetings with care coordinator, which have increased the patient's psychological stress.  In the ER, the patient has been tachycardic and tachypneic.  SpO2 in the 70s on room air and has been requiring a non-rebreather mask to maintain SpO2 90%.  Lab work has revealed elevated troponin and lactate.  EKG shows sinus tachycardia with old inferior infarct but no definite acute sinister changes.  PCCM asked to admit.     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     Antimicrobials:  Rocephin/azithromycin (9/23>>)     Scheduled Meds:  aspirin  81 mg Oral Daily   atorvastatin  10 mg Oral Daily   budesonide (PULMICORT) nebulizer solution  0.25 mg Nebulization BID   chlorhexidine  15 mL Mouth Rinse BID   Chlorhexidine Gluconate Cloth  6 each Topical Daily   feeding supplement  237 mL Oral BID BM   fentaNYL  1 patch Transdermal Q72H   ipratropium-albuterol  3 mL Nebulization Q6H   mouth rinse  15 mL Mouth Rinse BID   methylPREDNISolone (SOLU-MEDROL) injection  40 mg Intravenous BID   metoprolol tartrate  5 mg Intravenous Q6H   nortriptyline   75 mg Oral QHS   pantoprazole (PROTONIX) IV  40 mg Intravenous QHS   rOPINIRole  2 mg Oral QHS   Continuous Infusions:  azithromycin Stopped (10/27/20 1941)   cefTRIAXone (ROCEPHIN)  IV Stopped (10/27/20 1725)   dexmedetomidine (PRECEDEX) IV infusion 0.3 mcg/kg/hr (10/28/20 0608)   dextrose 5 % and 0.9% NaCl Stopped (10/27/20 1841)   heparin 1,650 Units/hr (10/28/20 0423)   PRN Meds:.acetaminophen, albuterol, docusate sodium, fentaNYL (SUBLIMAZE) injection, ondansetron (ZOFRAN) IV, polyethylene glycol    Interim history/subjective:  Now on fent patch, precedex drip and freq fent bolus IV for panic attacks and back on bipap overnight      Objective   BP 126/78   Pulse 94   Temp (!) 97.5 F (36.4 C) (Axillary)   Resp (!) 26   Ht _0  (1.88 m)   Wt 78.5 kg   SpO2 95%   BMI 22.22 kg/m     Filed Weights   10/27/20 0000 10/27/20 0426 10/28/20 0600  Weight: 77 kg 77 kg 78.5 kg    Intake/Output Summary (Last 24 hours) at 10/28/2020 0901 Last data filed at 10/28/2020 0758 Gross per 24 hour  Intake 1073.51 ml  Output 2350 ml  Net -1276.49 ml    FiO2 (%):  [80 %] 80 %   Examination: Tmax  97.8  General appearance:  he looks terminally ill to me with high wob if not on bipap  At Rest 02 sats  95%  on 0.70 BIPAP ST 12   No jvd Oropharynx clear,  mucosa nl Neck supple Lungs with coarse bs bilaterally - no wheeze RRR no s3 or or sign murmur Abd soft, limited insp excursion  Extr warm with no edema or clubbing noted Neuro  Sensorium anxious but oriented,  no apparent motor deficits   Expressed wishes we not talk to his wife x with him present      Resolved Hospital Problem list      Assessment & Plan:   ASSESSMENT/PLAN:  ASSESSMENT (included in the Hospital Problem List)  Active Problems:   NSTEMI (non-ST elevated myocardial infarction) ([place])   Dyspnea   Adenocarcinoma of left lung, stage 4 (HCC)   Cancer associated pain   Acute hypoxemic respiratory failure  (HCC)   Elevated troponin   Complex renal cyst   Ascending aortic aneurysm ([place])   By systems: PULMONARY Acute hypoxemic respiratory failure in pt with underlying PF  Stage IV adenocarcinoma of the lung with small L effusion Titrate supplemental oxygen to maintain SpO2 > 90% with bipap prn Aerosols: DuoNeb, Pulmicort, albuterol PRN Diuresed 9/24 and will repeat 9/25 and recheck cxr am 9/26   Strongly rec palliative care approach am 9/25      CARDIOVASCULAR Elevated troponins/NSTEMI Ascending aortic aneurysm - 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 Etiology  GI PROPHYLAXIS: Protonix   HEMATOLOGIC/ONCOLOGIC Stage IV adenocarcinoma of the lung Leukocytosis, unclear etiology, likely steroid-induced DVT PROPHYLAXIS:   changed to heparin gtt 9/23 Oncology consult.   Needs  palliative care.   INFECTIOUS Community-acquired pneumonia - working dx but doubt it  - PCT only 0.15 on adm - BC x 2  10/10/2020 > no growth to date - MRSA PCR screen 9/23 neg - Viral resp panel  9/23 neg for covid, flu Rocephin/azithromycin 9/23 >>>    ENDOCRINE: No acute issues   NEUROLOGIC Cancer pain/ severe anxiety  Start fentanyl patch at 50 mcg at rec of pharmacy on 9/24 but still needing frequent bolus fentanyl so double dose am 9/25 and added clonazepam       Best practice:  Diet: regular Pain/Anxiety/Delirium protocol (if indicated): N/A VAP protocol (if indicated): N/A DVT prophylaxis: heparin GI prophylaxis: Protonix Glucose control: N/A Mobility/Activity: bedrest for now    Code Status: Full Code Family Communication:   spouse updated at bedside. Disposition: admit to ICU.   Labs   CBC: Recent Labs  Lab 10/22/20 0953  10/19/2020 1826 10/27/20 0250 10/28/20 0249  WBC 11.5* 16.2* 12.1* 28.7*  NEUTROABS 8.2* 12.9*  --   --   HGB 14.9 16.9 15.2 14.1  HCT 42.7 49.2 45.1 41.6  MCV 87.3 87.7 88.8 89.7  PLT 219 195 146* 680    Basic Metabolic Panel: Recent Labs  Lab 10/22/20 0953 10/14/2020 1826 10/27/20 0250  NA 135 124* 126*  K 3.9 4.4 5.1  CL 98 86* 95*  CO2 23 22 19*  GLUCOSE 87 135* 197*  BUN _0 CREATININE 0.88 0.95 0.96  CALCIUM 9.5 9.1 8.5*  MG  --   --  2.4   GFR: Estimated Creatinine Clearance: 79.5 mL/min (by  C-G formula based on SCr of 0.96 mg/dL). Recent Labs  Lab 10/22/20 0953 10/08/2020 1826 10/30/2020 2012 10/27/20 0250 10/27/20 0712 10/28/20 0249  PROCALCITON  --   --  0.15  --   --   --   WBC 11.5* 16.2*  --  12.1*  --  28.7*  LATICACIDVEN  --  2.7* 3.2* 5.4* 3.6*  --     Liver Function Tests: Recent Labs  Lab 10/22/20 0953 10/25/2020 1826  AST 20 27  ALT 25 19  ALKPHOS 87 107  BILITOT 0.6 1.3*  PROT 6.7 7.4  ALBUMIN 3.1* 3.5   No results for input(s): LIPASE, AMYLASE in the last 168 hours. No results for input(s): AMMONIA in the last 168 hours.  ABG    Component Value Date/Time   PHART 7.332 (L) 10/27/2020 0424   PCO2ART 36.9 10/27/2020 0424   PO2ART 87.2 10/27/2020 0424   HCO3 19.0 (L) 10/27/2020 0424   ACIDBASEDEF 5.8 (H) 10/27/2020 0424   O2SAT 95.3 10/27/2020 0424     Coagulation Profile: Recent Labs  Lab 10/06/2020 1826  INR 1.4*    Cardiac Enzymes: No results for input(s): CKTOTAL, CKMB, CKMBINDEX, TROPONINI in the last 168 hours.  HbA1C: No results found for: HGBA1C  CBG: No results for input(s): GLUCAP in the last 168 hours.    The patient is critically ill with multiple organ systems failure and requires high complexity decision making for assessment and support, frequent evaluation and titration of therapies, application of advanced monitoring technologies and extensive interpretation of multiple databases. Critical Care Time  devoted to patient care services described in this note is 40  minutes.   Christinia Gully, MD Pulmonary and [place] [phone]   After 7:00 pm call Elink  [phone]

## 2020-10-28 NOTE — Progress Notes (Signed)
[facility] for Heparin Indication: chest pain/ACS  No Known Allergies  Patient Measurements: Height: 6\' 2"  (188 cm) Weight: 77 kg (169 lb 12.1 oz) IBW/kg (Calculated) : 82.2 Heparin Dosing Weight: 77 kg  Vital Signs: Temp: 97.5 F (36.4 C) (09/25 0333) Temp Source: Axillary (09/25 0333) BP: 117/80 (09/25 0300) Pulse Rate: 100 (09/25 0300)  Labs: Recent Labs    10/05/2020 1826 10/12/2020 2012 10/27/20 0250 10/27/20 0712 10/27/20 1659 10/28/20 0249  HGB 16.9  --  15.2  --   --  14.1  HCT 49.2  --  45.1  --   --  41.6  PLT 195  --  146*  --   --  192  APTT 31  --  37* 36 43* 47*  LABPROT 17.5*  --   --   --   --   --   INR 1.4*  --   --   --   --   --   HEPARINUNFRC >1.10*  --   --   --   --  >1.10*  CREATININE 0.95  --  0.96  --   --   --   TROPONINIHS 304* 294*  --   --   --   --      Estimated Creatinine Clearance: 78 mL/min (by C-G formula based on SCr of 0.96 mg/dL).   Medical History: Past Medical History:  Diagnosis Date   Arthritis    Dyspnea    Hyperlipidemia    Stage IV adenocarcinoma of lung, left ([place]) 10/27/2020   New diagnosis Aug/2022   Tinnitus     Medications:  Apixaban 5 mg bid PTA for h/o PE  Assessment: 70 y/o M with recent diagnosis of lung cancer not yet on chemotherapy and relatively recent h/o PE on Eliquis admitted with ShOB. Pharmacy consulted to initiate heparin infusion for ACS with elevated troponin.   Today, 10/28/2020 PTT remains subtherapeutic, 47 seconds  after heparin 2000 unit bolus & rate increase to 1400 units/hr Heparin level > 1.1, elevated due to PTA apixaban CBC: Hgb 14.1, Plts 192k No bleeding or interruptions with heparin infusion reported by RN  Goal of Therapy:  Heparin level 0.3-0.7 units/ml aPTT 66-102 seconds Monitor platelets by anticoagulation protocol: Yes   Plan:  Repeat Heparin 2000 units IV bolus Increase heparin infusion to 1650 units/hr aPTT in 8 hours Daily  HL/aPTT/CBC until levels corelate   Eudelia Bunch, Pharm.D 10/28/2020 4:06 AM

## 2020-10-28 NOTE — Progress Notes (Signed)
Removed PT from BiPAP at 1028- placed on PRB (15 LPM). RN aware. PT tolerating well at this time.

## 2020-10-28 NOTE — Progress Notes (Signed)
eLink Physician-Brief Progress Note Patient Name: Paul Roth DOB: 1950/03/15 MRN: [id number]   Date of Service  10/28/2020  HPI/Events of Note  Pt having freq coughing spell that cause panic attack. Using Fentanyl 100 mcg Q2 PRN, holds him briefly.  Got 400 total so far. On BiPAP. NPO. HR 100's. MAP good. Anxious.  Qtc < 450 on 23 rd. Lytes ok.   eICU Interventions  Start Precedex gtt low dose for now.       Intervention Category Intermediate Interventions: Pain - evaluation and management;Other:  Elmer Sow 10/28/2020, 2:57 AM

## 2020-10-29 ENCOUNTER — Telehealth: Payer: Self-pay | Admitting: Medical Oncology

## 2020-10-29 ENCOUNTER — Inpatient Hospital Stay: Payer: Medicare Other

## 2020-10-29 DIAGNOSIS — G893 Neoplasm related pain (acute) (chronic): Secondary | ICD-10-CM | POA: Diagnosis not present

## 2020-10-29 DIAGNOSIS — C3492 Malignant neoplasm of unspecified part of left bronchus or lung: Secondary | ICD-10-CM | POA: Diagnosis not present

## 2020-10-29 DIAGNOSIS — J9601 Acute respiratory failure with hypoxia: Secondary | ICD-10-CM | POA: Diagnosis not present

## 2020-10-29 LAB — CBC
HCT: 39.2 % (ref 39.0–52.0)
Hemoglobin: 13.2 g/dL (ref 13.0–17.0)
MCH: 29.9 pg (ref 26.0–34.0)
MCHC: 33.7 g/dL (ref 30.0–36.0)
MCV: 88.7 fL (ref 80.0–100.0)
Platelets: 218 10*3/uL (ref 150–400)
RBC: 4.42 MIL/uL (ref 4.22–5.81)
RDW: 13.1 % (ref 11.5–15.5)
WBC: 18.6 10*3/uL — ABNORMAL HIGH (ref 4.0–10.5)
nRBC: 0 % (ref 0.0–0.2)

## 2020-10-29 LAB — BASIC METABOLIC PANEL
Anion gap: 9 (ref 5–15)
BUN: 24 mg/dL — ABNORMAL HIGH (ref 8–23)
CO2: 27 mmol/L (ref 22–32)
Calcium: 9 mg/dL (ref 8.9–10.3)
Chloride: 100 mmol/L (ref 98–111)
Creatinine, Ser: 0.78 mg/dL (ref 0.61–1.24)
GFR, Estimated: >60 mL/min (ref >60)
Glucose, Bld: 126 mg/dL — ABNORMAL HIGH (ref 70–99)
Potassium: 4.7 mmol/L (ref 3.5–5.1)
Sodium: 136 mmol/L (ref 135–145)

## 2020-10-29 MED ORDER — ENOXAPARIN SODIUM 40 MG/0.4ML IJ SOSY
40.0000 mg | PREFILLED_SYRINGE | INTRAMUSCULAR | Status: DC
  Administered 2020-10-30: 40 mg via SUBCUTANEOUS
  Filled 2020-10-29: qty 0.4

## 2020-10-29 MED ORDER — METHYLPREDNISOLONE SODIUM SUCC 40 MG IJ SOLR
40.0000 mg | Freq: Every day | INTRAMUSCULAR | Status: DC
  Administered 2020-10-30: 40 mg via INTRAVENOUS
  Filled 2020-10-29: qty 1

## 2020-10-29 NOTE — Telephone Encounter (Signed)
Respiratory distress,elevated Lactic Acid Pt admitted Friday . In ICU stepdown. Wife said pt " really wants to start chemotherapy".  Per Dr Julien Nordmann I told wife that he will see him as outpt and discuss treatment.  Paul Roth

## 2020-10-29 NOTE — Significant Event (Signed)
Met with patient and wife at bedside.  We discussed his clinical condition.  Despite aggressive antibiotics, steroids, diuresis he has worsened.  Using BiPAP at night.  Nonrebreather and salter 15 L nasal cannula during the day to maintain saturations in the 90s.  He is taking very little to essentially 0 by mouth in terms of drinking and eating.  We discussed again that given his severe hypoxemia, he is not a candidate for chemotherapy.  Wife confirmed that she spoke with Dr. Lew Dawes office and they said that any treatment will be outpatient.  We discussed that given the nature of respiratory failure is related to worsening metastatic disease or cancer, but there is no way to make things better.  I shared with him that he is going to die from his cancer and from low oxygen likely in the timeframe of days to weeks.  He expressed understanding and did fear that would be the case.  He acknowledges he is unable to eat or drink anything and knows what that does to the body.  Asked about other treatment options, immunotherapy.  I reiterated to that would be outpatient treatment was not an option.  He has been hopeful that would be an option after his initial chemotherapy but is awaiting genetic testing on his cancer cells.  Wife asked about vitamin C treatment for his cancer.  I told them that was not an option either.  I shared that it was not my intention to take away any hope they may have, but rather be realistic and honest about his condition and options that  do remain.  I stated that we could continue current care without escalation of care.  This includes ongoing antibiotics, steroids, etc.  I shared with him that if things do not improve in the next 24 to 48 hours I would recommend comfort measures.  Focusing more on his symptoms and what the monitor or labs show.  I shared that if they decided to pursue comfort measures at this moment that would be very reasonable given the progression of his disease and the  lack of cancer directed therapies that may improve things, and to date the futility of current treatment.  I strongly recommended DNR/DNI.  The patient agreed with this this morning and both the patient and wife agreed that that would be prudent and he would not want to live on machines.  He stated this morning that was "no life."  CODE STATUS changed to DNR/DNI.  I told him I would give them time to talk through things.  Encouraged him that at any point in time prior to arbitrary deadline they decided to pursue therapy more in line with comfort to please let us know and we would honor that.  I shared with him that given the amount of oxygen he needs he would die either in the hospital or there is remote possibility of inpatient hospice setting.  He shared he would prefer to die in the hospital.

## 2020-10-29 NOTE — Progress Notes (Signed)
eLink Physician-Brief Progress Note Patient Name: Paul Roth DOB: 1950-06-21 MRN: [id number]   Date of Service  10/29/2020  HPI/Events of Note  Patient coughing up more blood this evening. RN asking if she should hold lovenox.  On discussion with bedside RN, blood is about dime sized and not too concerning at this time  eICU Interventions  Enoxaparin not due until tomorrow am. Discussed with bedside RN to inform day shift to assess whether enoxaparin needs to be held. Instructed to call back if with increasing amount of hemoptysis overnight     Intervention Category Intermediate Interventions: Bleeding - evaluation and treatment with blood products  Judd Lien 10/29/2020, 10:45 PM

## 2020-10-29 NOTE — Progress Notes (Addendum)
NAME:  Paul Roth MRN:  [id number] DOB:  [birth year] LOS: 3 ADMISSION DATE:  10/29/2020   CHIEF COMPLAINT:  dyspnea   History of Present Illness  This 70 y.o. Caucasian male reformed smoker presented to [facility] via EMS with complaints of progressively worsening dyspnea for the past 1.5 days. EMS treated the patient with abluterol 10 mg neb, Atrovent 1 mg neb, SoluMedrol 125 mg IV and magnesium 2 g IV without significant improvement.  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  He was scheduled to start chemotherapy on 10/28/2020 and has not yet undergone any antineoplastic therapy at this time.  The patient's wife adds that the development of symptoms does seem to correlate with recent doctor's visits and meetings with care coordinator, which have increased the patient's psychological stress.  In the ER, the patient has been tachycardic and tachypneic.  SpO2 in the 70s on room air and has been requiring a non-rebreather mask to maintain SpO2 90%.  Lab work has revealed elevated troponin and lactate.  EKG shows sinus tachycardia with old inferior infarct but no definite acute sinister changes.  PCCM asked to admit.   Antimicrobials:  Rocephin/azithromycin (9/23>>)     Scheduled Meds:  aspirin  81 mg Oral Daily   budesonide (PULMICORT) nebulizer solution  0.25 mg Nebulization BID   chlorhexidine  15 mL Mouth Rinse BID   Chlorhexidine Gluconate Cloth  6 each Topical Daily   clonazepam  0.5 mg Oral BID   enoxaparin (LOVENOX) injection  80 mg Subcutaneous Q12H   feeding supplement  237 mL Oral TID BM   [START ON 10/30/2020] fentaNYL  2 patch Transdermal Q72H   ipratropium-albuterol  3 mL Nebulization Q6H   mouth rinse  15 mL Mouth Rinse BID   methylPREDNISolone (SOLU-MEDROL) injection  40 mg Intravenous BID   metoprolol tartrate  5 mg Intravenous Q6H   nortriptyline  75 mg Oral QHS    pantoprazole (PROTONIX) IV  40 mg Intravenous QHS   rOPINIRole  2 mg Oral QHS   Continuous Infusions:  azithromycin Stopped (10/28/20 1906)   cefTRIAXone (ROCEPHIN)  IV Stopped (10/28/20 1736)   dexmedetomidine (PRECEDEX) IV infusion 0.3 mcg/kg/hr (10/29/20 0715)   dextrose 5 % and 0.9% NaCl Stopped (10/27/20 1841)   PRN Meds:.acetaminophen, albuterol, docusate sodium, fentaNYL (SUBLIMAZE) injection, ondansetron (ZOFRAN) IV, polyethylene glycol    Interim history/subjective:  No acute events overnight. On BiPAP overnight.  On high flow nasal cannula and nonrebreather at same time at time of evaluation.  Received fentanyl x4 in the last 24 hours.  Remains on fentanyl Duragesic patch.  Remains on Precedex drip.   Objective   BP 132/83   Pulse (!) 102   Temp 97.7 F (36.5 C) (Axillary)   Resp (!) 28   Ht 6\' 2"  (1.88 m)   Wt 77.4 kg   SpO2 91%   BMI 21.91 kg/m     Filed Weights   10/27/20 0426 10/28/20 0600 10/29/20 0645  Weight: 77 kg 78.5 kg 77.4 kg    Intake/Output Summary (Last 24 hours) at 10/29/2020 1034 Last data filed at 10/29/2020 0800 Gross per 24 hour  Intake 951.1 ml  Output 2650 ml  Net -1698.9 ml     FiO2 (%):  [70 %-80 %] 80 %   Examination: General: Chronically ill-appearing, in bed Eyes: EOMI, icterus Neck: Supple, JVP Pulmonary: Increased work of breathing, nonrebreather on top of salter nasal  cannula Cardiovascular: Regular rhythm, Extremities: No edema, warm     Resolved Hospital Problem list      Assessment & Plan:  Acute on chronic hypoxemic respiratory failure: In the setting of radiographic worsening of intraparenchymal metastatic disease from his lung cancer.  No clear infiltrate to suggest infection.  Do not see any reversible cause at this time.  Patient counseled to such. 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 --Continue fentanyl patch 100 mcg daily, continue as needed fentanyl, urine oxycodone for longer lasting effect  Widely metastatic lung cancer: Has not started treatment yet.  Poor candidate for treatment given his severe decompensation and decline, respiratory failure.    Elevated troponin: In the setting of significant hypoxemia, tachycardia.  Likely supply demand mismatch.  No concern for ACS. --Stop treatment dose Lovenox, decreased to DVT prophylaxis dose 9/26  Best practice:  Diet: regular Pain/Anxiety/Delirium protocol (if indicated): N/A VAP protocol (if indicated): N/A DVT prophylaxis: Lovenox GI prophylaxis: Protonix Glucose control: N/A Mobility/Activity: bedrest for now    Code Status: Full Code Family Communication:   spouse updated at bedside. Disposition: admit to ICU.   Labs   CBC: Recent Labs  Lab 10/07/2020 1826 10/27/20 0250 10/28/20 0249 10/29/20 0244  WBC 16.2* 12.1* 28.7* 18.6*  NEUTROABS 12.9*  --   --   --   HGB 16.9 15.2 14.1 13.2  HCT 49.2 45.1 41.6 39.2  MCV 87.7 88.8 89.7 88.7  PLT 195 146* 192 218     Basic Metabolic Panel: Recent Labs  Lab 10/25/2020 1826 10/27/20 0250 10/29/20 0244  NA 124* 126* 136  K 4.4 5.1 4.7  CL 86* 95* 100  CO2 22 19* 27  GLUCOSE 135* 197* 126*  BUN 11 11 24*  CREATININE 0.95 0.96 0.78  CALCIUM 9.1 8.5* 9.0  MG  --  2.4  --     GFR: Estimated Creatinine Clearance: 94.1 mL/min (by C-G formula based on SCr of 0.78 mg/dL). Recent Labs  Lab 10/13/2020 1826 10/09/2020 2012 10/27/20 0250 10/27/20 0712 10/28/20 0249  10/29/20 0244  PROCALCITON  --  0.15  --   --   --   --   WBC 16.2*  --  12.1*  --  28.7* 18.6*  LATICACIDVEN 2.7* 3.2* 5.4* 3.6*  --   --      Liver Function Tests: Recent Labs  Lab 10/13/2020 1826  AST 27  ALT 19  ALKPHOS 107  BILITOT 1.3*  PROT 7.4  ALBUMIN 3.5    No results for input(s): LIPASE, AMYLASE in the last 168 hours. No results for input(s): AMMONIA in the last 168 hours.  ABG    Component Value Date/Time   PHART 7.332 (L) 10/27/2020 0424   PCO2ART 36.9 10/27/2020 0424   PO2ART 87.2 10/27/2020 0424   HCO3 19.0 (L) 10/27/2020 0424   ACIDBASEDEF 5.8 (H) 10/27/2020 0424   O2SAT 95.3 10/27/2020 0424      Coagulation Profile: Recent Labs  Lab 10/15/2020 1826  INR 1.4*     Cardiac Enzymes: No results  for input(s): CKTOTAL, CKMB, CKMBINDEX, TROPONINI in the last 168 hours.  HbA1C: No results found for: HGBA1C  CBG: No results for input(s): GLUCAP in the last 168 hours.   CRITICAL CARE Performed by: Paul Roth Paul Roth   Total critical care time: 45 minutes  Critical care time was exclusive of separately billable procedures and treating other patients.  Critical care was necessary to treat or prevent imminent or life-threatening deterioration.  Critical care was time spent personally by me on the following activities: development of treatment plan with patient and/or surrogate as well as nursing, discussions with consultants, evaluation of patient's response to treatment, examination of patient, obtaining history from patient or surrogate, ordering and performing treatments and interventions, ordering and review of laboratory studies, ordering and review of radiographic studies, pulse oximetry and re-evaluation of patient's condition.  Paul Clam, MD See Paul Roth for contact info

## 2020-10-30 ENCOUNTER — Encounter (HOSPITAL_COMMUNITY): Payer: Self-pay | Admitting: Internal Medicine

## 2020-10-30 ENCOUNTER — Other Ambulatory Visit: Payer: Self-pay | Admitting: Internal Medicine

## 2020-10-30 DIAGNOSIS — J9601 Acute respiratory failure with hypoxia: Secondary | ICD-10-CM | POA: Diagnosis not present

## 2020-10-30 DIAGNOSIS — Z7189 Other specified counseling: Secondary | ICD-10-CM

## 2020-10-30 DIAGNOSIS — Z515 Encounter for palliative care: Secondary | ICD-10-CM | POA: Diagnosis not present

## 2020-10-30 DIAGNOSIS — R531 Weakness: Secondary | ICD-10-CM

## 2020-10-30 DIAGNOSIS — I712 Thoracic aortic aneurysm, without rupture: Secondary | ICD-10-CM | POA: Diagnosis not present

## 2020-10-30 DIAGNOSIS — G893 Neoplasm related pain (acute) (chronic): Secondary | ICD-10-CM | POA: Diagnosis not present

## 2020-10-30 DIAGNOSIS — C3492 Malignant neoplasm of unspecified part of left bronchus or lung: Secondary | ICD-10-CM | POA: Diagnosis not present

## 2020-10-30 LAB — CBC
HCT: 44.8 % (ref 39.0–52.0)
Hemoglobin: 14.7 g/dL (ref 13.0–17.0)
MCH: 29.9 pg (ref 26.0–34.0)
MCHC: 32.8 g/dL (ref 30.0–36.0)
MCV: 91.2 fL (ref 80.0–100.0)
Platelets: 232 10*3/uL (ref 150–400)
RBC: 4.91 MIL/uL (ref 4.22–5.81)
RDW: 13.1 % (ref 11.5–15.5)
WBC: 17.6 10*3/uL — ABNORMAL HIGH (ref 4.0–10.5)
nRBC: 0 % (ref 0.0–0.2)

## 2020-10-30 MED ORDER — POLYVINYL ALCOHOL 1.4 % OP SOLN
1.0000 [drp] | Freq: Four times a day (QID) | OPHTHALMIC | Status: DC | PRN
  Filled 2020-10-30: qty 15

## 2020-10-30 MED ORDER — IPRATROPIUM-ALBUTEROL 0.5-2.5 (3) MG/3ML IN SOLN: 3.0000 mL | Freq: Four times a day (QID) | RESPIRATORY_TRACT | Status: DC | PRN

## 2020-10-30 MED ORDER — CLONAZEPAM 0.5 MG PO TBDP
1.0000 mg | ORAL_TABLET | Freq: Three times a day (TID) | ORAL | Status: DC
  Administered 2020-10-30 – 2020-10-31 (×3): 1 mg via ORAL
  Filled 2020-10-30 (×3): qty 2

## 2020-10-30 MED ORDER — HYDROMORPHONE HCL 1 MG/ML IJ SOLN
INTRAMUSCULAR | Status: AC
  Administered 2020-10-30: 0.5 mg via INTRAVENOUS
  Filled 2020-10-30: qty 1

## 2020-10-30 MED ORDER — DOCUSATE SODIUM 100 MG PO CAPS
100.0000 mg | ORAL_CAPSULE | Freq: Every day | ORAL | Status: DC
  Filled 2020-10-30: qty 1

## 2020-10-30 MED ORDER — ONDANSETRON HCL 4 MG/2ML IJ SOLN: 4.0000 mg | Freq: Four times a day (QID) | INTRAMUSCULAR | Status: DC | PRN

## 2020-10-30 MED ORDER — LIDOCAINE 5 % EX PTCH
1.0000 | MEDICATED_PATCH | CUTANEOUS | Status: DC
  Administered 2020-10-30 – 2020-10-31 (×2): 1 via TRANSDERMAL
  Filled 2020-10-30 (×2): qty 1

## 2020-10-30 MED ORDER — HYDROMORPHONE HCL 1 MG/ML IJ SOLN
0.5000 mg | Freq: Once | INTRAMUSCULAR | Status: AC
  Administered 2020-10-30: 0.5 mg via INTRAVENOUS

## 2020-10-30 MED ORDER — CYCLOBENZAPRINE HCL 10 MG PO TABS: 10.0000 mg | ORAL_TABLET | Freq: Once | ORAL | Status: DC

## 2020-10-30 MED ORDER — OXYCODONE HCL 5 MG PO TABS
10.0000 mg | ORAL_TABLET | Freq: Four times a day (QID) | ORAL | Status: DC
  Filled 2020-10-30: qty 2

## 2020-10-30 MED ORDER — KETOROLAC TROMETHAMINE 30 MG/ML IJ SOLN
30.0000 mg | Freq: Once | INTRAMUSCULAR | Status: AC
  Administered 2020-10-30: 30 mg via INTRAVENOUS

## 2020-10-30 MED ORDER — BUDESONIDE 0.5 MG/2ML IN SUSP: 0.5000 mg | Freq: Two times a day (BID) | RESPIRATORY_TRACT | Status: DC

## 2020-10-30 MED ORDER — LORAZEPAM 1 MG PO TABS: 1.0000 mg | ORAL_TABLET | ORAL | Status: DC | PRN

## 2020-10-30 MED ORDER — HYDROMORPHONE BOLUS VIA INFUSION
0.2500 mg | INTRAVENOUS | Status: DC | PRN
  Administered 2020-10-31 (×2): 1 mg via INTRAVENOUS
  Filled 2020-10-30: qty 1

## 2020-10-30 MED ORDER — LORAZEPAM 2 MG/ML PO CONC: 1.0000 mg | ORAL | Status: DC | PRN

## 2020-10-30 MED ORDER — ONDANSETRON 4 MG PO TBDP: 4.0000 mg | ORAL_TABLET | Freq: Four times a day (QID) | ORAL | Status: DC | PRN

## 2020-10-30 MED ORDER — GLYCOPYRROLATE 1 MG PO TABS: 1.0000 mg | ORAL_TABLET | ORAL | Status: DC | PRN

## 2020-10-30 MED ORDER — GLYCOPYRROLATE 0.2 MG/ML IJ SOLN: 0.2000 mg | INTRAMUSCULAR | Status: DC | PRN

## 2020-10-30 MED ORDER — OXYCODONE HCL 5 MG PO TABS: 5.0000 mg | ORAL_TABLET | Freq: Four times a day (QID) | ORAL | Status: DC

## 2020-10-30 MED ORDER — KETOROLAC TROMETHAMINE 30 MG/ML IJ SOLN
INTRAMUSCULAR | Status: AC
  Administered 2020-10-30: 30 mg via INTRAVENOUS
  Filled 2020-10-30: qty 1

## 2020-10-30 MED ORDER — SODIUM CHLORIDE 0.9 % IV SOLN
0.5000 mg/h | INTRAVENOUS | Status: DC
  Administered 2020-10-30: 0.5 mg/h via INTRAVENOUS
  Filled 2020-10-30: qty 5

## 2020-10-30 MED ORDER — METOPROLOL TARTRATE 5 MG/5ML IV SOLN: 5.0000 mg | Freq: Four times a day (QID) | INTRAVENOUS | Status: DC | PRN

## 2020-10-30 MED ORDER — DIPHENHYDRAMINE HCL 50 MG/ML IJ SOLN: 12.5000 mg | INTRAMUSCULAR | Status: DC | PRN

## 2020-10-30 MED ORDER — LORAZEPAM 2 MG/ML IJ SOLN
1.0000 mg | INTRAMUSCULAR | Status: DC | PRN
  Administered 2020-10-31: 1 mg via INTRAVENOUS
  Filled 2020-10-30: qty 1

## 2020-10-30 MED ORDER — BIOTENE DRY MOUTH MT LIQD: 15.0000 mL | OROMUCOSAL | Status: DC | PRN

## 2020-10-30 MED ORDER — POLYETHYLENE GLYCOL 3350 17 G PO PACK
17.0000 g | PACK | Freq: Every day | ORAL | Status: DC
  Filled 2020-10-30: qty 1

## 2020-10-30 NOTE — Consult Note (Signed)
Consultation Note Date: 10/30/2020   Patient Name: Paul Roth  DOB: 1950-12-10  MRN: [id number]  Age / Sex: 70 y.o., male  PCP: Bernerd Limbo, MD Referring Physician: Lanier Clam, MD  Reason for Consultation: Non pain symptom management, Pain control, and possibly terminal Care  HPI/Patient Profile: 70 y.o. male  admitted on 10/11/2020 with  .   Clinical Assessment and Goals of Care: 70 year old gentleman who lives at home with his wife in [place], [place] admitted with acute hypoxic respiratory failure, lung cancer, found to have expansion of intraparenchymal mass also has centrilobular emphysema.  Life limiting illness of widely metastatic lung cancer. Patient with acute pain crisis earlier today, started on Dilaudid and Toradol with good effect.  Adjuvants such as Lidoderm patch also applied. Transition to comfort measures only.  Palliative medicine team consulted for additional support. Patient is awake alert resting in bed.  He is on high flow nasal cannula.  He does become short of breath when he attempts to speak for more than a minute or so.  His wife is present at the bedside.  I introduced myself and palliative care as follows: Palliative medicine is specialized medical care for people living with serious illness. It focuses on providing relief from the symptoms and stress of a serious illness. The goal is to improve quality of life for both the patient and the family. Goals of care: Broad aims of medical therapy in relation to the patient's values and preferences. Our aim is to provide medical care aimed at enabling patients to achieve the goals that matter most to them, given the circumstances of their particular medical situation and their constraints.    HCPOA Wife and who is present at the bedside.  They live in [place], [place].  They do not have children.   Patient's wife has a nephew who lives in [place], [place].  SUMMARY OF RECOMMENDATIONS   Agree with DO NOT RESUSCITATE Agree with full scope of comfort measures.  Medication orders reviewed.  Agree completely with the multimodal approach to pain and non pain symptom management as outlined by Dr. Lake Bells and his team.  Patient is on Dilaudid continuous infusion, Precedex infusion.  He is on scheduled benzodiazepines as well as as needed.  He has Robinul available for secretions.  He has bowel and antiemetic regimen available.  Agree completely with comfort measures and currently available medication regimen.  Will also request for spiritual care support as an additional layer of support. Comfort measures to remain as a singular focus, discussed with patient and wife.  Patient asks for what his medical management will look like in case that he has worsening of his symptoms in spite of current regimen.  Discussed with him about "proportionate palliative sedation" if he has intractable symptoms in spite of above regimen and that it would involve benzodiazepine such as lorazepam or midazolam given in a continuous infusion where the goal would be to avoid suffering with the intention being to manage intractable symptoms. Anticipate hospital death. Thank  you for the consult Code Status/Advance Care Planning: DNR   Symptom Management:  As outlined above.  Palliative Prophylaxis:  Delirium Protocol  Additional Recommendations (Limitations, Scope, Preferences): Full Comfort Care  Psycho-social/Spiritual:  Desire for further Chaplaincy support:yes Additional Recommendations: Caregiving  Support/Resources  Prognosis:  Hours - Days  Discharge Planning: Anticipated Hospital Death      Primary Diagnoses: Present on Admission:  Cancer associated pain  Adenocarcinoma of left lung, stage 4 (HCC)  Dyspnea  Acute hypoxemic respiratory failure (HCC)  Elevated troponin  Ascending  aortic aneurysm (HCC)  NSTEMI (non-ST elevated myocardial infarction) (HCC)  Pleural effusion on left   I have reviewed the medical record, interviewed the patient and family, and examined the patient. The following aspects are pertinent.  Past Medical History:  Diagnosis Date   Arthritis    Dyspnea    Hyperlipidemia    Stage IV adenocarcinoma of lung, left ([place]) 10/27/2020   New diagnosis Aug/2022   Tinnitus    Social History   Socioeconomic History   Marital status: Married    Spouse name: Not on file   Number of children: Not on file   Years of education: Not on file   Highest education level: Not on file  Occupational History   Not on file  Tobacco Use   Smoking status: Former    Packs/day: 1.00    Years: 24.00    Pack years: 24.00    Types: Cigarettes    Quit date: 24    Years since quitting: 32.7   Smokeless tobacco: Never  Vaping Use   Vaping Use: Never used  Substance and Sexual Activity   Alcohol use: Yes    Comment: rare   Drug use: Not Currently    Types: Marijuana    Comment: hx of years ago   Sexual activity: Not on file  Other Topics Concern   Not on file  Social History Narrative   Lives with wife   Social Determinants of Health   Financial Resource Strain: Not on file  Food Insecurity: Not on file  Transportation Needs: Not on file  Physical Activity: Not on file  Stress: Not on file  Social Connections: Not on file   Family History  Problem Relation Age of Onset   Lung disease Mother    Lung disease Maternal Aunt    Scheduled Meds:  chlorhexidine  15 mL Mouth Rinse BID   Chlorhexidine Gluconate Cloth  6 each Topical Daily   clonazepam  1 mg Oral TID   docusate sodium  100 mg Oral Daily   lidocaine  1 patch Transdermal Q24H   mouth rinse  15 mL Mouth Rinse BID   oxyCODONE  10 mg Oral Q6H   polyethylene glycol  17 g Oral Daily   rOPINIRole  2 mg Oral QHS   Continuous Infusions:  dexmedetomidine (PRECEDEX) IV infusion 0.6  mcg/kg/hr (10/30/20 1250)   HYDROmorphone 1 mg/hr (10/30/20 1200)   PRN Meds:.acetaminophen, albuterol, antiseptic oral rinse, diphenhydrAMINE, glycopyrrolate **OR** glycopyrrolate **OR** glycopyrrolate, HYDROmorphone, ipratropium-albuterol, LORazepam **OR** LORazepam **OR** LORazepam, ondansetron **OR** ondansetron (ZOFRAN) IV, polyvinyl alcohol Medications Prior to Admission:  Prior to Admission medications   Medication Sig Start Date End Date Taking? Authorizing Provider  albuterol (VENTOLIN HFA) 108 (90 Base) MCG/ACT inhaler Inhale 2 puffs into the lungs every 6 (six) hours as needed for wheezing or shortness of breath. 09/12/20  Yes Magdalen Spatz, NP  apixaban (ELIQUIS) 5 MG TABS tablet Take 1 tablet (5 mg total)  by mouth 2 (two) times daily. 08/14/20 10/22/2020 Yes Shawna Clamp, MD  Ascorbic Acid (VITAMIN C) 1000 MG tablet Take 1,000 mg by mouth daily.   Yes [provider]  atorvastatin (LIPITOR) 10 MG tablet Take 10 mg by mouth daily. 05/15/20  Yes [provider]  Cholecalciferol (DIALYVITE VITAMIN D 5000) 125 MCG (5000 UT) capsule Take 5,000 Units by mouth daily.   Yes [provider]  fluticasone (FLONASE) 50 MCG/ACT nasal spray Place 2 sprays into both nostrils daily.   Yes [provider]  folic acid (FOLVITE) 1 MG tablet Take 1 tablet (1 mg total) by mouth daily. 10/22/20  Yes Curt Bears, MD  MAGNESIUM PO Take 175 mg by mouth daily.   Yes [provider]  nortriptyline (PAMELOR) 75 MG capsule Take 75 mg by mouth at bedtime. 06/23/20  Yes [provider]  pantoprazole (PROTONIX) 40 MG tablet Take 1 tablet (40 mg total) by mouth 2 (two) times daily before a meal. 08/08/20  Yes Shawna Clamp, MD  RESVERATROL PO Take 1 capsule by mouth daily.   Yes [provider]  rOPINIRole (REQUIP) 2 MG tablet Take 1 tablet (2 mg total) by mouth at bedtime. 09/12/20 11/11/20 Yes Magdalen Spatz, NP  traMADol (ULTRAM) 50 MG tablet Take 1  tablet (50 mg total) by mouth every 6 (six) hours as needed. Patient taking differently: Take 50 mg by mouth every 6 (six) hours as needed for moderate pain. 10/22/20  Yes Curt Bears, MD  trolamine salicylate (ASPERCREME) 10 % cream Apply 1 application topically as needed for muscle pain.   Yes [provider]  dexamethasone (DECADRON) 4 MG tablet 1 tablet p.o. twice daily the day before, day of and day after chemotherapy every 3 weeks Patient not taking: No sig reported 10/22/20   Curt Bears, MD  prochlorperazine (COMPAZINE) 10 MG tablet TAKE 1 TABLET(10 MG) BY MOUTH EVERY 6 HOURS AS NEEDED FOR NAUSEA OR VOMITING 10/30/20   Curt Bears, MD  umeclidinium-vilanterol [facility] ELLIPTA) 62.5-25 MCG/INH AEPB Inhale 1 puff into the lungs daily. Patient not taking: No sig reported 10/17/20   [name], MD   No Known Allergies Review of Systems + pain +anxiety Physical Exam Awake alert resting in bed Currently on high flow Is able to speak in full sentences however becomes extremely short of breath after about a minute or so. Has mild increased respiratory effort S1-S2 Has clubbing of digits upper extremities Appears frail  Vital Signs: BP 128/85   Pulse 85   Temp 97.8 F (36.6 C) (Axillary)   Resp (!) 23   Ht 6\' 2"  (1.88 m)   Wt 75.9 kg   SpO2 93%   BMI 21.48 kg/m  Pain Scale: 0-10 POSS *See Group Information*: S-Acceptable,Sleep, easy to arouse Pain Score: 3    SpO2: SpO2: 93 % O2 Device:SpO2: 93 % O2 Flow Rate: .O2 Flow Rate (L/min): 50 L/min  IO: Intake/output summary:  Intake/Output Summary (Last 24 hours) at 10/30/2020 1359 Last data filed at 10/30/2020 1200 Gross per 24 hour  Intake 588.73 ml  Output 1425 ml  Net -836.27 ml    LBM: Last BM Date: 10/28/20 Baseline Weight: Weight: 77.1 kg Most recent weight: Weight: 75.9 kg     Palliative Assessment/Data:   PPS 40%.  Time In: 1300 Time Out: 1400 Time Total: 60 Greater than 50%  of this  time was spent counseling and coordinating care related to the above assessment and plan.  Signed by:  Loistine Chance, MD   Please contact Palliative Medicine Team phone at [phone] for questions and concerns.  For individual provider: See Shea Evans

## 2020-10-30 NOTE — Progress Notes (Signed)
NAME:  Paul Roth MRN:  [id number] DOB:  1951/02/02 LOS: 4 ADMISSION DATE:  10/30/2020   CHIEF COMPLAINT:  dyspnea   History of Present Illness  This 70 y.o. Caucasian male reformed smoker presented to [facility] Emergency Department via EMS with complaints of progressively worsening dyspnea for the past 1.5 days. EMS treated the patient with abluterol 10 mg neb, Atrovent 1 mg neb, SoluMedrol 125 mg IV and magnesium 2 g IV without significant improvement.  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  He was scheduled to start chemotherapy on 10/28/2020 and has not yet undergone any antineoplastic therapy at this time.  The patient's wife adds that the development of symptoms does seem to correlate with recent doctor's visits and meetings with care coordinator, which have increased the patient's psychological stress.  In the ER, the patient has been tachycardic and tachypneic.  SpO2 in the 70s on room air and has been requiring a non-rebreather mask to maintain SpO2 90%.  Lab work has revealed elevated troponin and lactate.  EKG shows sinus tachycardia with old inferior infarct but no definite acute sinister changes.  PCCM asked to admit.   Antimicrobials:  Rocephin/azithromycin (9/23>> 9/27    Scheduled Meds:  aspirin  81 mg Oral Daily   budesonide (PULMICORT) nebulizer solution  0.5 mg Nebulization BID   chlorhexidine  15 mL Mouth Rinse BID   Chlorhexidine Gluconate Cloth  6 each Topical Daily   clonazepam  1 mg Oral TID   cyclobenzaprine  10 mg Oral Once   enoxaparin (LOVENOX) injection  40 mg Subcutaneous Q24H   feeding supplement  237 mL Oral TID BM   ipratropium-albuterol  3 mL Nebulization Q6H   lidocaine  1 patch Transdermal Q24H   mouth rinse  15 mL Mouth Rinse BID   methylPREDNISolone (SOLU-MEDROL) injection  40 mg Intravenous Daily   nortriptyline  75 mg Oral QHS   oxyCODONE  5 mg Oral Q6H    pantoprazole (PROTONIX) IV  40 mg Intravenous QHS   rOPINIRole  2 mg Oral QHS   Continuous Infusions:  cefTRIAXone (ROCEPHIN)  IV Stopped (10/29/20 1739)   dexmedetomidine (PRECEDEX) IV infusion 0.5 mcg/kg/hr (10/30/20 0817)   dextrose 5 % and 0.9% NaCl Stopped (10/27/20 1841)   HYDROmorphone     PRN Meds:.acetaminophen, albuterol, docusate sodium, HYDROmorphone, metoprolol tartrate, ondansetron (ZOFRAN) IV, polyethylene glycol    Interim history/subjective:  9/26 - No acute events overnight. On BiPAP overnight.  On high flow nasal cannula and nonrebreather at same time at time of evaluation.  Received fentanyl x4 in the last 24 hours.  Remains on fentanyl Duragesic patch.  Remains on Precedex drip.  Code status changed to DNR/ DNI, PMT consulted.  9/27- Multiple coughing episodes (trying to sip water under BiPAP mask).  Went on BiPAP for worsening SOB/ anxiety around 4am.  Remains on precedex now at 1, and several prn doses of fentanyl for pain in right shoulder/ axilla but remains poorly controlled complicated by ongoing anxiety  Objective   BP (!) 150/92   Pulse (!) 101   Temp 97.8 F (36.6 C) (Axillary)   Resp (!) 27   Ht 6\' 2"  (1.88 m)   Wt 75.9 kg   SpO2 91%   BMI 21.48 kg/m     Filed Weights   10/28/20 0600 10/29/20 0645 10/30/20 0500  Weight: 78.5 kg 77.4 kg 75.9 kg    Intake/Output Summary (Last 24  hours) at 10/30/2020 0941 Last data filed at 10/30/2020 1607 Gross per 24 hour  Intake 538.85 ml  Output 1525 ml  Net -986.15 ml    FiO2 (%):  [90 %-100 %] 100 %   Examination: General:  chronically ill, thin appearing male in acute distress in bed, grabbing right upper chest/ axilla area HEENT: full face bipap mask Neuro: awake, communicating, MAE CV: rr, ST PULM:  labored/ tachypneic on NIV 8/5, getting large TVs, clear diminished on right, coarse on left with slight diffuse wheeze  GI: soft, bs active Extremities: warm/dry, no LE edema, pain in right upper  chest/ axilla area changed with movement/ palpation- better if he applies pressure  Afebrile Labs reviewed: CBC stable UOP 1.8L/ 24 hrs -1.3L/ net -1.7L   Resolved Hospital Problem list    Assessment & Plan:  Acute on chronic hypoxemic respiratory failure: In the setting of radiographic worsening of intraparenchymal metastatic disease from his lung cancer.  No clear infiltrate to suggest infection.  Do not see any reversible cause at this time.  Patient counseled to such. Anxiety/air hunger Cancer-related pain Widely metastatic lung cancer: Has not started treatment yet.  Poor candidate for treatment given his severe decompensation and decline, respiratory failure. P:  - Continue CAP coverage 9/23 to planned end date 9/27 - Solumedrol 40mg  daily, weaning started 9/27 - Continue BiPAP as needed for SOB/ WOB, will change to heated high flow O2 hopefully for continued support but will give him the opportunity to talk/ drink and eat - Continue duonebs q 6, prn nebs, and pulmicort  - his pain and anxiety have been very poorly controlled overnight and this morning despite precedex gtt and multiple pushes of fentanyl (in addition to fentanyl patch).  Concerned that he is progressing near end of life now and aggressive measures thus far, abx, diuresis, steroids, nebs, he continues to decline.  He and his wife agreed on DNR/ DNI yesterday with plan to consider transition to comfort if no further improvement in 24-48 hrs.   I called and spoke to his wife, Paul Roth, who is getting ready to come up.  Verbalized my concern of his suffering and that we have reached this point where we need to consider on focusing on comfort now.  She just wants him not to suffer or be in pain.  We discussed pain options and at this time she is ok with [place] switching to a dilaudid gtt for better pain control which can be titrated.   - in addition, we will try a Lidoderm patch, flexeril x 1 and trial of Toradol, as I suspect it may be  musculoskeletal, to help see if we can relieve his acute right shoulder/ upper chest pain.   Other interventions of heat have been minimal. - some concern of right ptx given coughing spells and BiPAP but he has good lung sliding on bedside ultrasound - d/c fentanyl and change to dilaudid gtt.  Will add scheduled oxy IR 10mg  q6 and increase klonopin from 0.5mg  BID to 1mg  TID, in hopes to minimize gtts.  Scheduled bowel regimen added to avoid discomfort of constipation with narcotics.  -- palliative care consulted 9/26, as discussed on 9/26  Best practice:  Diet: regular Pain/Anxiety/Delirium protocol (if indicated): as above VAP protocol (if indicated): N/A DVT prophylaxis: Lovenox GI prophylaxis: Protonix Glucose control: N/A Mobility/Activity: bedrest for now    Code Status: DNR/ DNI Family Communication:  patient updated at bedside and wife Vicente Males, called on phone Disposition: ICU  Labs   CBC: Recent Labs  Lab 10/12/2020 1826 10/27/20 0250 10/28/20 0249 10/29/20 0244 10/30/20 0300  WBC 16.2* 12.1* 28.7* 18.6* 17.6*  NEUTROABS 12.9*  --   --   --   --   HGB 16.9 15.2 14.1 13.2 14.7  HCT 49.2 45.1 41.6 39.2 44.8  MCV 87.7 88.8 89.7 88.7 91.2  PLT 195 146* 192 218 641    Basic Metabolic Panel: Recent Labs  Lab 10/10/2020 1826 10/27/20 0250 10/29/20 0244  NA 124* 126* 136  K 4.4 5.1 4.7  CL 86* 95* 100  CO2 22 19* 27  GLUCOSE 135* 197* 126*  BUN 11 11 24*  CREATININE 0.95 0.96 0.78  CALCIUM 9.1 8.5* 9.0  MG  --  2.4  --    GFR: Estimated Creatinine Clearance: 92.2 mL/min (by C-G formula based on SCr of 0.78 mg/dL). Recent Labs  Lab 10/09/2020 1826 10/25/2020 2012 10/27/20 0250 10/27/20 0712 10/28/20 0249 10/29/20 0244 10/30/20 0300  PROCALCITON  --  0.15  --   --   --   --   --   WBC 16.2*  --  12.1*  --  28.7* 18.6* 17.6*  LATICACIDVEN 2.7* 3.2* 5.4* 3.6*  --   --   --     Liver Function Tests: Recent Labs  Lab 10/21/2020 1826  AST 27  ALT 19  ALKPHOS 107   BILITOT 1.3*  PROT 7.4  ALBUMIN 3.5   No results for input(s): LIPASE, AMYLASE in the last 168 hours. No results for input(s): AMMONIA in the last 168 hours.  ABG    Component Value Date/Time   PHART 7.332 (L) 10/27/2020 0424   PCO2ART 36.9 10/27/2020 0424   PO2ART 87.2 10/27/2020 0424   HCO3 19.0 (L) 10/27/2020 0424   ACIDBASEDEF 5.8 (H) 10/27/2020 0424   O2SAT 95.3 10/27/2020 0424     Coagulation Profile: Recent Labs  Lab 10/09/2020 1826  INR 1.4*    Cardiac Enzymes: No results for input(s): CKTOTAL, CKMB, CKMBINDEX, TROPONINI in the last 168 hours.  HbA1C: No results found for: HGBA1C  CBG: No results for input(s): GLUCAP in the last 168 hours.   CCT: 40 mins   Kennieth Rad, ACNP [facility] 10/30/2020, 9:41 AM  See Amion for pager If no response to pager, please call PCCM consult pager After 7:00 pm call Elink

## 2020-10-30 NOTE — Significant Event (Signed)
  PCCM Interval Note   Patient much more comfortable after earlier pain/ anxiety interventions.    Wife at bedside.  Patient and wife asking what comfort care would look like.  Patient fearful to have another episode like this morning.  We discussed comfort measures and patient has decided he is ready to focus solely on comfort, supported by his wife.    Will place orders to reflect.        Kennieth Rad, ACNP [facility] 10/30/2020, 11:58 AM  See Amion for pager If no response to pager, please call PCCM consult pager After 7:00 pm call Elink

## 2020-10-30 NOTE — TOC Initial Note (Signed)
Transition of Care [facility]) - Initial/Assessment Note    Patient Details  Name: Paul Roth MRN: [id number] Date of Birth: 1950-07-26  [facility] CM/SW Contact:    Leeroy Cha, RN Phone Number: 10/30/2020, 9:45 AM  Clinical Narrative:                 70 y.o. Caucasian male reformed smoker presented to [facility] Emergency Department via EMS with complaints of progressively worsening dyspnea for the past 1.5 days. EMS treated the patient with abluterol 10 mg neb, Atrovent 1 mg neb, SoluMedrol 125 mg IV and magnesium 2 g IV without significant improvement.  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  He was scheduled to start chemotherapy on 10/28/2020 and has not yet undergone any antineoplastic therapy at this time.  The patient's wife adds that the development of symptoms does seem to correlate with recent doctor's visits and meetings with care coordinator, which have increased the patient's psychological stress.   In the ER, the patient has been tachycardic and tachypneic.  SpO2 in the 70s on room air and has been requiring a non-rebreather mask to maintain SpO2 90%.  Lab work has revealed elevated troponin and lactate.  EKG shows sinus tachycardia with old inferior infarct but no definite acute sinister changes.  [id number] am code status changed to dnr./dni\ TOC PLAN OF CARE:  following for progression and toc needs, on bipap at the present time,       Patient Goals and CMS Choice        Expected Discharge Plan and Services                                                Prior Living Arrangements/Services                       Activities of Daily Living Home Assistive Devices/Equipment: Eyeglasses, Oxygen ADL Screening (condition at time of admission) Patient's cognitive ability adequate to safely complete daily activities?: No Is the patient deaf or have difficulty  hearing?: Yes (wears ear plugs because normal sounds hurt his ears) Does the patient have difficulty seeing, even when wearing glasses/contacts?: No Does the patient have difficulty concentrating, remembering, or making decisions?: Yes (some trouble today - is new problem) Patient able to express need for assistance with ADLs?: Yes Does the patient have difficulty dressing or bathing?: Yes Independently performs ADLs?: No Communication: Independent Dressing (OT): Needs assistance Is this a change from baseline?: Change from baseline, expected to last >3 days Grooming: Needs assistance Is this a change from baseline?: Change from baseline, expected to last >3 days Feeding: Needs assistance Is this a change from baseline?: Change from baseline, expected to last >3 days Bathing: Needs assistance Is this a change from baseline?: Change from baseline, expected to last >3 days Toileting: Needs assistance Is this a change from baseline?: Change from baseline, expected to last >3days In/Out Bed: Needs assistance Is this a change from baseline?: Change from baseline, expected to last >3 days Walks in Home: Needs assistance Is this a change from baseline?: Change from baseline, expected to last >3 days Does the patient have difficulty walking or climbing stairs?: Yes Weakness of Legs: Both Weakness of Arms/Hands: Both  Permission Sought/Granted  Emotional Assessment              Admission diagnosis:  Respiratory distress [R06.03] Troponin level elevated [R77.8] Malignant neoplasm of lung, unspecified laterality, unspecified part of lung (HCC) [C34.90] Acute hypoxemic respiratory failure ([place]) [J96.01] Patient Active Problem List   Diagnosis Date Noted   Pleural effusion on left 10/27/2020   Acute hypoxemic respiratory failure ([place]) 10/13/2020   Elevated troponin 10/24/2020   Complex renal cyst 10/16/2020   Ascending aortic aneurysm ([place]) 10/22/2020    Adenocarcinoma of left lung, stage 4 ([place]) 10/22/2020   Encounter for antineoplastic chemotherapy 10/22/2020   Cancer associated pain 10/22/2020   Adenocarcinoma of left lung, stage 3 ([place]) 10/04/2020   Pulmonary embolism ([place]) 08/04/2020   NSTEMI (non-ST elevated myocardial infarction) ([place]) 08/04/2020   Interstitial lung disease ([place]) 08/04/2020   Dyspnea 08/04/2020   Mass of upper lobe of left lung 08/04/2020   Leukocytosis 08/04/2020   PCP:  Bernerd Limbo, MD Pharmacy:   [facility] [place], [address] AT [address] [place] [address] Phone: [phone] Fax: [phone]     Social Determinants of Health (SDOH) Interventions    Readmission Risk Interventions No flowsheet data found.

## 2020-10-31 ENCOUNTER — Ambulatory Visit: Payer: PRIVATE HEALTH INSURANCE | Admitting: Radiation Oncology

## 2020-10-31 ENCOUNTER — Ambulatory Visit: Payer: PRIVATE HEALTH INSURANCE

## 2020-10-31 DIAGNOSIS — I824Y9 Acute embolism and thrombosis of unspecified deep veins of unspecified proximal lower extremity: Secondary | ICD-10-CM

## 2020-10-31 DIAGNOSIS — G893 Neoplasm related pain (acute) (chronic): Secondary | ICD-10-CM | POA: Diagnosis not present

## 2020-10-31 DIAGNOSIS — C3492 Malignant neoplasm of unspecified part of left bronchus or lung: Secondary | ICD-10-CM | POA: Diagnosis not present

## 2020-10-31 DIAGNOSIS — J9601 Acute respiratory failure with hypoxia: Secondary | ICD-10-CM | POA: Diagnosis not present

## 2020-10-31 LAB — CULTURE, BLOOD (ROUTINE X 2)
Culture: NO GROWTH
Special Requests: ADEQUATE

## 2020-11-02 LAB — CULTURE, BLOOD (ROUTINE X 2): Special Requests: ADEQUATE

## 2020-11-03 LAB — AFB CULTURE WITH SMEAR (NOT AT ARMC)
Acid Fast Culture: NEGATIVE
Acid Fast Smear: NEGATIVE

## 2020-11-03 NOTE — Death Summary Note (Signed)
DEATH SUMMARY   Patient Details  Name: Paul Roth MRN: [id number] DOB: February 01, 1951  Admission/Discharge Information   Admit Date:  11/17/2020  Date of Death: Date of Death: 11-22-20  Time of Death: Time of Death: 05-04-49  Length of Stay: 5  Referring Physician: Bernerd Limbo, MD   Reason(s) for Hospitalization  Acute respiratory failure with hypoxia  Diagnoses  Preliminary cause of death:  Secondary Diagnoses (including complications and co-morbidities):  Active Problems:   NSTEMI (non-ST elevated myocardial infarction) (HCC)   Dyspnea   Adenocarcinoma of left lung, stage 4 (HCC)   Cancer associated pain   Acute hypoxemic respiratory failure (HCC)   Elevated troponin   Complex renal cyst   Ascending aortic aneurysm (HCC)   Pleural effusion on left Leukocytosis History of DVT on chronic anticoagulation Comfort-care Cachexia  Brief Hospital Course (including significant findings, care, treatment, and services provided and events leading to death)  Paul Roth is a 70 y.o. year old male who was recently diagnosed with stage 4 lung adenocarcinoma who presented to the hospital with severe acute respiratory failure with hypoxia. He required high levels of noninvasive support with minimal improvement over several days despite escalating his anticoagulation due to being at risk for PE (not able to get CTA with AKI), antibiotics (CAP treatment with azithromycin and ceftriaxone), steroids, and bronchodilators. He was transitioned to comfort care and aggressive care was weaned off during his hospitalization. He passed away on 2022-11-23 at 15:50 with family at bedside.    Pertinent Labs and Studies  Significant Diagnostic Studies CT Angio Chest Pulmonary Embolism (PE) W or WO Contrast  Result Date: November 17, 2020 CLINICAL DATA:  Non-small cell lung cancer, dyspnea, high probability pulmonary embolism EXAM: CT ANGIOGRAPHY CHEST WITH CONTRAST TECHNIQUE: Multidetector CT imaging of the  chest was performed using the standard protocol during bolus administration of intravenous contrast. Multiplanar CT image reconstructions and MIPs were obtained to evaluate the vascular anatomy. CONTRAST:  6mL OMNIPAQUE IOHEXOL 350 MG/ML SOLN COMPARISON:  09/09/2020, PET CT 10/19/2020 FINDINGS: Cardiovascular: There is adequate opacification of the pulmonary arterial tree. No intraluminal filling defect identified to suggest acute pulmonary embolism. The central pulmonary arteries are enlarged in keeping with changes of pulmonary arterial hypertension. No significant coronary artery calcification. Global cardiac size within normal limits. The the thoracic aorta is mildly dilated in its ascending segment measuring 4.4 cm in greatest dimension. Descending thoracic aorta is of normal caliber. Mild atherosclerotic calcification within the thoracic aorta. Mediastinum/Nodes: Pathologic left supraclavicular, mediastinal and bilateral hilar adenopathy is again identified in keeping with widespread nodal metastatic disease. The esophagus is patulous, but is otherwise unremarkable. The visualized thyroid is unremarkable. Lungs/Pleura: Since the prior examination, there has developed segmental occlusion of the left upper lobe pulmonary bronchus, best seen on image # 77/11, possibly related to intraluminal debris, mucus, or extrinsic compression secondary to pathologic adenopathy, with dense consolidation of the left upper lobe likely representing postobstructive pneumonic consolidation. There is, additionally, interval development of extensive consolidation and ground-glass pulmonary infiltrate throughout the right lung which may be related to acute infection or can relate to changes of immunotherapy in the appropriate clinical setting. Moderate left pleural effusion has developed in the interval since prior examination. Superimposed extensive pulmonary fibrotic changes again seen with honeycombing at the lung bases. No  pneumothorax. Upper Abdomen: Scattered low-attenuation lesions within the right hepatic dome and left hepatic lobe more metabolically active on prior PET-CT examination and likely represent multiple cysts. Known hepatic metastases are not well appreciated on  this examination. No acute abnormality. Musculoskeletal: No acute bone abnormality. Numerous osseous metastases noted on prior PET CT examination are largely occult on this examination though a single mixed lytic and sclerotic metastasis noted within the lateral right seventh rib. Review of the MIP images confirms the above findings. IMPRESSION: No pulmonary embolism. Interval occlusion of the left upper lobe pulmonary bronchus, possibly related to extrinsic compression from adjacent pathologic adenopathy or endoluminal debris or mucus. Dense consolidation of the left upper lobe may reflect changes of a postobstructive pneumonia. Widespread extensive consolidation and ground-glass infiltrate within the right lung, possibly reflecting changes of acute infection. Interval development of moderate left pleural effusion. Stable changes of pulmonary fibrosis with extensive bibasilar honeycombing. Widespread mediastinal, hilar, and left supraclavicular adenopathy in keeping with nodal metastatic disease. Known osseous and hepatic metastases are not well appreciated on this examination and are better visualized on prior PET-CT examination of 10/19/2020. Dilation of the ascending thoracic aorta with maximal dimension of 4.4 cm. Recommend annual imaging followup by CTA or MRA. This recommendation follows 2010 ACCF/AHA/AATS/ACR/ASA/SCA/SCAI/SIR/STS/SVM Guidelines for the Diagnosis and Management of Patients with Thoracic Aortic Disease. Circulation. 2010; 121: U882-C003. Aortic aneurysm NOS (ICD10-I71.9) Aortic Atherosclerosis (ICD10-I70.0). Electronically Signed   By: Fidela Salisbury M.D.   On: 10/04/2020 22:26   MR BRAIN W WO CONTRAST  Result Date: 10/16/2020 CLINICAL  DATA:  Malignant neoplasm of unspecified part of unspecified bronchus or lung. Non-small cell lung cancer, staging. EXAM: MRI HEAD WITHOUT AND WITH CONTRAST TECHNIQUE: Multiplanar, multiecho pulse sequences of the brain and surrounding structures were obtained without and with intravenous contrast. CONTRAST:  54mL GADAVIST GADOBUTROL 1 MMOL/ML IV SOLN COMPARISON:  No pertinent prior exams available for comparison. FINDINGS: Intermittently motion degraded examination. Most notably, there is moderate motion degradation of the coronal T2 TSE sequence and mild motion degradation of the coronal T1 weighted postcontrast sequence. Brain: Mild generalized cerebral atrophy. Minimal multifocal T2/FLAIR hyperintensity within the cerebral white matter, nonspecific but compatible with chronic small vessel ischemic disease. There is no acute infarct. No evidence of an intracranial mass. No chronic intracranial blood products. No extra-axial fluid collection. No midline shift. No pathologic intracranial enhancement to suggest metastatic disease to the brain. Vascular: Maintained flow voids within the proximal large arterial vessels. Skull and upper cervical spine: 14 mm T2 FLAIR hyperintense focus within the right parietal calvarium (series 11, image 46). Corresponding diffusion weighted signal abnormality at this site. Sinuses/Orbits: Visualized orbits show no acute finding. Bilateral lens replacements. Mild mucosal thickening along the floor of the left maxillary sinus. IMPRESSION: Intermittently motion degraded examination, as described. No evidence of metastatic disease to the brain. 14 mm focus of signal abnormality within the right parietal calvarium, nonspecific but suspicious for an osseous metastasis. Attention recommended on follow-up. Minimal chronic small-vessel ischemic changes within the cerebral white matter. Mild generalized cerebral atrophy. Mild left maxillary sinus mucosal thickening. Electronically Signed   By:  Kellie Simmering D.O.   On: 10/16/2020 08:00   NM PET Image Initial (PI) Skull Base To Thigh (F-18 FDG)  Result Date: 10/20/2020 CLINICAL DATA:  Initial treatment strategy for non-small cell lung cancer. EXAM: NUCLEAR MEDICINE PET SKULL BASE TO THIGH TECHNIQUE: 8.7 mCi F-18 FDG was injected intravenously. Full-ring PET imaging was performed from the skull base to thigh after the radiotracer. CT data was obtained and used for attenuation correction and anatomic localization. Fasting blood glucose: 100 mg/dl COMPARISON:  Multiple exams, including CT chest 09/09/2020 FINDINGS: Mediastinal blood pool activity: SUV max 1.9  Liver activity: SUV max NA NECK: No significant abnormal hypermetabolic activity in this region. Incidental CT findings: none CHEST: Consolidative mass posteriorly in the left upper lobe with maximum SUV 25.5, with progressive and complete opacification of the left upper lobe aside from the lingula. The hypermetabolic region measures about 11.0 by 8.2 cm. A 6 mm left lower lobe nodule on image 91 of series 4 has a maximum SUV of 4.9, with metastatic lesion, several ground-glass opacities in the right lung have vague metabolic activity, such as the right apical ground-glass opacity measuring 2.5 cm in diameter with maximum SUV of 4.3. There is some mild nodularity in the right middle lobe which appears faintly hypermetabolic concerning for potential malignancy. Indistinct hypermetabolic nodule along the diaphragmatic margin of the right lower lobe has a maximum SUV of 9.8. There is hypermetabolic prevascular, AP window, paratracheal, subcarinal, para-aortic, and left supraclavicular adenopathy. Index right lower paratracheal node 2.1 cm in short axis on image 75 series 4 with maximum SUV 24.9. Hypermetabolic lesions along the right lateral pectoralis and biceps musculature raising suspicion for malignancy, dominant index hypermetabolic lesion in this region with maximum SUV of 20.9. These nodules are  difficult to separate out from the surrounding muscular tissues. Incidental CT findings: Moderate left pleural effusion, nonspecific for malignant involvement. Emphysema. Bilateral interstitial accentuation in the lungs. Coronary and aortic atherosclerosis. Ascending thoracic aortic aneurysm 4.4 cm in diameter on image 83 series 4. ABDOMEN/PELVIS: Scattered hepatic metastatic lesions are observed. Index hypodense lesion anteriorly in the left hepatic lobe measuring about 2.3 by 1.6 cm has maximum SUV of 27.1. Right adrenal metastatic lesion measuring 3.3 by 2.6 cm on image 115 series 4, maximum SUV 26.5. Smaller left adrenal metastatic foci are present. Hypermetabolic focus in the pancreatic head measuring about 1.7 cm in diameter, maximum SUV 27.3. Small focus of activity near the right sciatic notch without a well-defined CT correlate, maximum SUV 4.5, this appears to be separate from the ureter which is more anterior in position. Incidental CT findings: Confluent complex cystic lesions of the right kidney upper pole posteriorly, not obviously hypermetabolic but not fully characterized on today's exam. Atherosclerosis is present, including aortoiliac atherosclerotic disease. Sigmoid colon diverticulosis. SKELETON: Innumerable scattered skeletal metastatic lesions are in general poorly appreciable on the CT data, and include the right posterior calvarium, spine, bilateral scapula, sternum, ribs, bony pelvis, and proximal femurs. Index right sternal body lesion has a maximum SUV of 28.1. Index left L4 lesion maximum SUV 25.6. Incidental CT findings: Right proximal humeral prosthesis. Endplate sclerosis at Y6-5. Endplate sclerosis and possible chronic superior endplate compression at T11-12. IMPRESSION: 1. 11 cm highly hypermetabolic consolidative mass posteriorly in the left upper lobe associated with extensive adenopathy, suspected bilateral small pulmonary metastatic lesions, widespread osseous metastatic disease,  numerous metastatic lesions to the liver, bilateral adrenal metastatic lesions, and a metastatic lesion to the pancreatic head. 2. Small focus of activity near the right sciatic notch without a well-defined CT correlate, artifact versus a small metastatic focus. 3. Focal hypermetabolic lesions in the right anterior shoulder vicinity along the pectoralis and biceps musculature, suspicious for metastatic lesions. 4. Moderate left pleural effusion is nonspecific for malignant involvement. Substantial bilateral interstitial accentuation in underlying emphysema in the lungs. 5. Confluent complex cystic lesions of the right kidney upper pole posteriorly with faint calcifications along septations. This may warrant future workup with renal protocol MRI with and without contrast when the patient's clinical scenario permits, to assigned Bosniak classification. 6. Ascending thoracic aortic aneurysm  4.4 cm in diameter. Recommend annual imaging followup by CTA or MRA. This recommendation follows 2010 ACCF/AHA/AATS/ACR/ASA/SCA/SCAI/SIR/STS/SVM Guidelines for the Diagnosis and Management of Patients with Thoracic Aortic Disease. Circulation. 2010; 121: M353-I144. Aortic aneurysm NOS (ICD10-I71.9) 7. Other imaging findings of potential clinical significance: Aortic Atherosclerosis (ICD10-I70.0). Coronary atherosclerosis. Sigmoid colon diverticulosis. Electronically Signed   By: Van Clines M.D.   On: 10/20/2020 08:19   DG Chest Port 1 View  Result Date: 10/27/2020 CLINICAL DATA:  Respiratory failure EXAM: PORTABLE CHEST 1 VIEW COMPARISON:  10/20/2020 FINDINGS: Heart size stable. Signs of mediastinal adenopathy again noted. Small left pleural effusion, unchanged. Dense airspace consolidation throughout the left upper lobe is unchanged. Patchy airspace densities within the right upper lobe are also unchanged. Diffuse reticular interstitial opacities are noted within the visualized lower lung zones. Visualized osseous  structures are unremarkable. IMPRESSION: 1. No significant change in aeration to lungs compared with the previous exam. 2. Small left pleural effusion. 3. Stable mediastinal adenopathy. Electronically Signed   By: Kerby Moors M.D.   On: 10/27/2020 06:35   DG Chest Portable 1 View  Result Date: 10/10/2020 CLINICAL DATA:  Prior diagnosis of lung cancer 3 months ago, presenting with shortness of breath. EXAM: PORTABLE CHEST 1 VIEW COMPARISON:  None. FINDINGS: Large area of opacification is seen involving the left apex and lateral aspect of the mid to upper left lung. This is increased in severity when compared to the prior study. Diffuse, chronic interstitial lung disease is seen. A superimposed moderate severity infiltrate is seen overlying the mid to upper right lung. This is new when compared to the prior exam. The heart size and mediastinal contours are within normal limits. The visualized skeletal structures are unremarkable. IMPRESSION: 1. Large area of opacification involving the left apex and lateral aspect of the mid to upper left lung, which likely represents sequelae associated with the patient's history of lung cancer. Associated component of loculated pleural fluid can not be excluded. 2. Chronic interstitial lung disease with superimposed moderate severity infiltrate within the mid to upper right lung. Electronically Signed   By: Virgina Norfolk M.D.   On: 10/07/2020 19:09   ECHOCARDIOGRAM COMPLETE  Result Date: 10/27/2020    ECHOCARDIOGRAM REPORT   Patient Name:   Paul Roth Date of Exam: 10/27/2020 Medical Rec #:  [id number]         Height:       74.0 in Accession #:    [id number]        Weight:       169.8 lb Date of Birth:  11-10-1950         BSA:          2.027 m Patient Age:    68 years          BP:           117/72 mmHg Patient Gender: M                 HR:           100 bpm. Exam Location:  Inpatient Procedure: 2D Echo, Color Doppler and Cardiac Doppler Indications:    NSTEMI  i21.4  History:        Patient has prior history of Echocardiogram examinations, most                 recent 08/06/2020. Risk Factors:Dyslipidemia and Lung Cancer.  Sonographer:    Raquel Sarna Senior RDCS Referring Phys: [id number] Renee Pain  Sonographer Comments: Technically difficult  due to lung interference, scanned upright due to dyspnea. IMPRESSIONS  1. Aorta remains signficantly dilated compared to TTE 08/16/20.  2. RV size and function worse compared to TTE 08/16/20.  3. Left ventricular ejection fraction, by estimation, is 60 to 65%. The left ventricle has normal function. The left ventricle has no regional wall motion abnormalities. Left ventricular diastolic parameters were normal.  4. Right ventricular systolic function is severely reduced. The right ventricular size is severely enlarged. There is moderately elevated pulmonary artery systolic pressure.  5. Some shadowing artifact seen in RA.  6. The mitral valve is normal in structure. No evidence of mitral valve regurgitation. No evidence of mitral stenosis.  7. The aortic valve is tricuspid. There is mild calcification of the aortic valve. Aortic valve regurgitation is not visualized. Mild aortic valve sclerosis is present, with no evidence of aortic valve stenosis.  8. Aortic dilatation noted. There is severe dilatation of the aortic root, measuring 46 mm. There is dilatation of the ascending aorta, measuring 45 mm.  9. The inferior vena cava is normal in size with <50% respiratory variability, suggesting right atrial pressure of 8 mmHg. FINDINGS  Left Ventricle: Left ventricular ejection fraction, by estimation, is 60 to 65%. The left ventricle has normal function. The left ventricle has no regional wall motion abnormalities. The left ventricular internal cavity size was normal in size. There is  no left ventricular hypertrophy. Left ventricular diastolic parameters were normal. Right Ventricle: The right ventricular size is severely enlarged. Right  vetricular wall thickness was not assessed. Right ventricular systolic function is severely reduced. There is moderately elevated pulmonary artery systolic pressure. The tricuspid regurgitant velocity is 3.35 m/s, and with an assumed right atrial pressure of 8 mmHg, the estimated right ventricular systolic pressure is 25.0 mmHg. Left Atrium: Left atrial size was normal in size. Right Atrium: Some shadowing artifact seen in RA. Right atrial size was normal in size. Pericardium: There is no evidence of pericardial effusion. Mitral Valve: The mitral valve is normal in structure. No evidence of mitral valve regurgitation. No evidence of mitral valve stenosis. Tricuspid Valve: The tricuspid valve is normal in structure. Tricuspid valve regurgitation is not demonstrated. No evidence of tricuspid stenosis. Aortic Valve: The aortic valve is tricuspid. There is mild calcification of the aortic valve. Aortic valve regurgitation is not visualized. Mild aortic valve sclerosis is present, with no evidence of aortic valve stenosis. Pulmonic Valve: The pulmonic valve was normal in structure. Pulmonic valve regurgitation is not visualized. No evidence of pulmonic stenosis. Aorta: The aortic root is normal in size and structure and aortic dilatation noted. There is severe dilatation of the aortic root, measuring 46 mm. There is dilatation of the ascending aorta, measuring 45 mm. Venous: The inferior vena cava is normal in size with less than 50% respiratory variability, suggesting right atrial pressure of 8 mmHg. IAS/Shunts: No atrial level shunt detected by color flow Doppler. Additional Comments: RV size and function worse compared to TTE 08/16/20. Aorta remains signficantly dilated compared to TTE 08/16/20.  LEFT VENTRICLE PLAX 2D LVIDd:         3.30 cm LVIDs:         2.40 cm LV PW:         1.00 cm LV IVS:        0.70 cm LVOT diam:     2.30 cm LV SV:         57 LV SV Index:   28 LVOT Area:  4.15 cm  RIGHT VENTRICLE RV S prime:      7.72 cm/s TAPSE (M-mode): 1.5 cm LEFT ATRIUM           Index       RIGHT ATRIUM           Index LA diam:      2.20 cm 1.09 cm/m  RA Area:     17.20 cm LA Vol (A4C): 35.0 ml 17.27 ml/m RA Volume:   49.30 ml  24.32 ml/m  AORTIC VALVE LVOT Vmax:   94.60 cm/s LVOT Vmean:  70.300 cm/s LVOT VTI:    0.136 m  AORTA Ao Root diam: 4.60 cm Ao Asc diam:  4.50 cm TRICUSPID VALVE TR Peak grad:   44.9 mmHg TR Vmax:        335.00 cm/s  SHUNTS Systemic VTI:  0.14 m Systemic Diam: 2.30 cm Jenkins Rouge MD Electronically signed by Jenkins Rouge MD Signature Date/Time: 10/27/2020/10:27:19 AM    Final     Microbiology Recent Results (from the past 240 hour(s))  Blood Culture (routine x 2)     Status: None (Preliminary result)   Collection Time: 11/01/2020  6:27 PM   Specimen: BLOOD  Result Value Ref Range Status   Specimen Description   Final    BLOOD BLOOD LEFT FOREARM Performed at [facility], [place] [address], [place], [place] [address]    Special Requests   Final    BOTTLES DRAWN AEROBIC AND ANAEROBIC Blood Culture adequate volume Performed at [facility] [address] [place], [place] [address]    Culture  Setup Time   Final    GRAM POSITIVE RODS ANAEROBIC BOTTLE ONLY CRITICAL RESULT CALLED TO, READ BACK BY AND VERIFIED WITH: Damian Leavell [id number] FCP Performed at [facility], [place] [address], [place], [place] [address]    Culture GRAM POSITIVE RODS  Final   Report Status PENDING  Incomplete  Resp Panel by RT-PCR (Flu A&B, Covid) Nasopharyngeal Swab     Status: None   Collection Time: 10/22/2020  6:27 PM   Specimen: Nasopharyngeal Swab; Nasopharyngeal(NP) swabs in vial transport medium  Result Value Ref Range Status   SARS Coronavirus 2 by RT PCR NEGATIVE NEGATIVE Final    Comment: (NOTE) SARS-CoV-2 target nucleic acids are NOT DETECTED.  The SARS-CoV-2 RNA is generally detectable in upper respiratory specimens during the acute phase of infection.  The lowest concentration of SARS-CoV-2 viral copies this assay can detect is 138 copies/mL. A negative result does not preclude SARS-Cov-2 infection and should not be used as the sole basis for treatment or other patient management decisions. A negative result may occur with  improper specimen collection/handling, submission of specimen other than nasopharyngeal swab, presence of viral mutation(s) within the areas targeted by this assay, and inadequate number of viral copies(<138 copies/mL). A negative result must be combined with clinical observations, patient history, and epidemiological information. The expected result is Negative.  Fact Sheet for Patients:  EntrepreneurPulse.com.au  Fact Sheet for Healthcare Providers:  IncredibleEmployment.be  This test is no t yet approved or cleared by the [place] FDA and  has been authorized for detection and/or diagnosis of SARS-CoV-2 by FDA under an Emergency Use Authorization (EUA). This EUA will remain  in effect (meaning this test can be used) for the duration of the COVID-19 declaration under Section 564(b)(1) of the Act, 21 U.S.C.section 360bbb-3(b)(1), unless the authorization is terminated  or revoked sooner.  Influenza A by PCR NEGATIVE NEGATIVE Final   Influenza B by PCR NEGATIVE NEGATIVE Final    Comment: (NOTE) The Xpert Xpress SARS-CoV-2/FLU/RSV plus assay is intended as an aid in the diagnosis of influenza from Nasopharyngeal swab specimens and should not be used as a sole basis for treatment. Nasal washings and aspirates are unacceptable for Xpert Xpress SARS-CoV-2/FLU/RSV testing.  Fact Sheet for Patients: EntrepreneurPulse.com.au  Fact Sheet for Healthcare Providers: IncredibleEmployment.be  This test is not yet approved or cleared by the [place] FDA and has been authorized for detection and/or diagnosis of SARS-CoV-2 by FDA  under an Emergency Use Authorization (EUA). This EUA will remain in effect (meaning this test can be used) for the duration of the COVID-19 declaration under Section 564(b)(1) of the Act, 21 U.S.C. section 360bbb-3(b)(1), unless the authorization is terminated or revoked.  Performed at [facility], [place] [address] [place], [place] [address]   Blood Culture (routine x 2)     Status: None   Collection Time: 10/08/2020  6:41 PM   Specimen: BLOOD  Result Value Ref Range Status   Specimen Description   Final    BLOOD LEFT ANTECUBITAL Performed at [facility] [address] [place], [place] [address]    Special Requests   Final    BOTTLES DRAWN AEROBIC AND ANAEROBIC Blood Culture adequate volume Performed at [facility] [address] [address], [place], [place] [address]    Culture   Final    NO GROWTH 5 DAYS Performed at [facility], [place] [address], [place], [place] [address]    Report Status 11/18/20 FINAL  Final  MRSA Next Gen by PCR, Nasal     Status: None   Collection Time: 10/11/2020 11:00 PM   Specimen: Nasal Mucosa; Nasal Swab  Result Value Ref Range Status   MRSA by PCR Next Gen NOT DETECTED NOT DETECTED Final    Comment: (NOTE) The GeneXpert MRSA Assay (FDA approved for NASAL specimens only), is one component of a comprehensive MRSA colonization surveillance program. It is not intended to diagnose MRSA infection nor to guide or monitor treatment for MRSA infections. Test performance is not FDA approved in patients less than 106 years old. Performed at [facility], [place] [address], [place], [place] [address]     Lab Basic Metabolic Panel: Recent Labs  Lab 10/08/2020 1826 10/27/20 0250 10/29/20 0244  NA 124* 126* 136  K 4.4 5.1 4.7  CL 86* 95* 100  CO2 22 19* 27  GLUCOSE 135* 197* 126*  BUN 11 11 24*  CREATININE 0.95 0.96 0.78  CALCIUM 9.1 8.5* 9.0  MG  --  2.4  --    Liver Function  Tests: Recent Labs  Lab 10/10/2020 1826  AST 27  ALT 19  ALKPHOS 107  BILITOT 1.3*  PROT 7.4  ALBUMIN 3.5   No results for input(s): LIPASE, AMYLASE in the last 168 hours. No results for input(s): AMMONIA in the last 168 hours. CBC: Recent Labs  Lab 10/04/2020 1826 10/27/20 0250 10/28/20 0249 10/29/20 0244 10/30/20 0300  WBC 16.2* 12.1* 28.7* 18.6* 17.6*  NEUTROABS 12.9*  --   --   --   --   HGB 16.9 15.2 14.1 13.2 14.7  HCT 49.2 45.1 41.6 39.2 44.8  MCV 87.7 88.8 89.7 88.7 91.2  PLT 195 146* 192 218 232   Cardiac Enzymes: No results for input(s): CKTOTAL, CKMB, CKMBINDEX, TROPONINI in the last 168 hours.  Sepsis Labs: Recent Labs  Lab 10/14/2020 1826 10/16/2020 2012 10/27/20 0250 10/27/20 0712 10/28/20 0249 10/29/20 0244 10/30/20 0300  PROCALCITON  --  0.15  --   --   --   --   --   WBC 16.2*  --  12.1*  --  28.7* 18.6* 17.6*  LATICACIDVEN 2.7* 3.2* 5.4* 3.6*  --   --   --     Procedures/Operations  none   Julian Hy 11-29-20, 6:38 PM

## 2020-11-03 NOTE — Progress Notes (Signed)
Nurse called to bedside at 1550 due to patient heart rate dropping and 02 saturation dropping. Patient went asystole. Wife at bedside. 2 nurses present in room. Laurance Flatten RN and Earnie Larsson RN. No heart rhythm confirmed for 2 minutes each. Time of death 40. All belonging returned to family.

## 2020-11-03 NOTE — Progress Notes (Signed)
NAME:  Paul Roth MRN:  [id number] DOB:  1950/10/03 LOS: 5 ADMISSION DATE:  10/05/2020   CHIEF COMPLAINT:  dyspnea   History of Present Illness  This 70 y.o. Caucasian male reformed smoker presented to [facility] Emergency Department via EMS with complaints of progressively worsening dyspnea for the past 1.5 days. EMS treated the patient with abluterol 10 mg neb, Atrovent 1 mg neb, SoluMedrol 125 mg IV and magnesium 2 g IV without significant improvement.  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  He was scheduled to start chemotherapy on 10/28/2020 and has not yet undergone any antineoplastic therapy at this time.  The patient's wife adds that the development of symptoms does seem to correlate with recent doctor's visits and meetings with care coordinator, which have increased the patient's psychological stress.  In the ER, the patient has been tachycardic and tachypneic.  SpO2 in the 70s on room air and has been requiring a non-rebreather mask to maintain SpO2 90%.  Lab work has revealed elevated troponin and lactate.  EKG shows sinus tachycardia with old inferior infarct but no definite acute sinister changes.  PCCM asked to admit.  Antimicrobials:  Rocephin/azithromycin (9/23>> 9/27  Scheduled Meds:  chlorhexidine  15 mL Mouth Rinse BID   Chlorhexidine Gluconate Cloth  6 each Topical Daily   clonazepam  1 mg Oral TID   docusate sodium  100 mg Oral Daily   lidocaine  1 patch Transdermal Q24H   mouth rinse  15 mL Mouth Rinse BID   oxyCODONE  10 mg Oral Q6H   polyethylene glycol  17 g Oral Daily   rOPINIRole  2 mg Oral QHS   Continuous Infusions:  dexmedetomidine (PRECEDEX) IV infusion 0.6 mcg/kg/hr (11-25-2020 1038)   HYDROmorphone 1 mg/hr (10/30/20 2300)   PRN Meds:.acetaminophen, albuterol, antiseptic oral rinse, diphenhydrAMINE, glycopyrrolate **OR** glycopyrrolate **OR** glycopyrrolate, HYDROmorphone,  ipratropium-albuterol, LORazepam **OR** LORazepam **OR** LORazepam, ondansetron **OR** ondansetron (ZOFRAN) IV, polyvinyl alcohol    Interim History  9/26 - No acute events overnight. On BiPAP overnight.  On high flow nasal cannula and nonrebreather at same time at time of evaluation.  Received fentanyl x4 in the last 24 hours.  Remains on fentanyl Duragesic patch.  Remains on Precedex drip.  Code status changed to DNR/ DNI, PMT consulted.  9/27- Multiple coughing episodes (trying to sip water under BiPAP mask).  Went on BiPAP for worsening SOB/ anxiety around 4am.  Remains on precedex now at 1, and several prn doses of fentanyl for pain in right shoulder/ axilla but remains poorly controlled complicated by ongoing anxiety.  Transitioned to full comfort care  Subjective:  Wife at bedside.  Patient resting.  She reports that he has periods of wakefulness and then sleeps.  Has remained on HHFNC thus far  Remains on precedex gtt at 0.6 mcg/kg/hr and dilaudid gtt at 1mg /hr.  Objective   BP 131/83   Pulse (!) 105   Temp 97.8 F (36.6 C) (Axillary)   Resp (!) 23   Ht 6\' 2"  (1.88 m)   Wt 75.9 kg   SpO2 (!) 82%   BMI 21.48 kg/m     Filed Weights   10/28/20 0600 10/29/20 0645 10/30/20 0500  Weight: 78.5 kg 77.4 kg 75.9 kg    Intake/Output Summary (Last 24 hours) at 2020/11/25 1149 Last data filed at 11-25-20 0600 Gross per 24 hour  Intake 189.45 ml  Output 950 ml  Net -760.55 ml  FiO2 (%):  [100 %] 100 %   Examination: General:  ill cachetic appearing gentleman resting upright in bed HEENT: MM pink/dry Neuro: did not wake to touch initially but then during conversation with wife, he woke up and talked few words CV: rr PULM:  non labored, regular/deep Extremities: warm/dry  Resolved Hospital Problem list    Assessment & Plan:  Acute on chronic hypoxemic respiratory failure: In the setting of radiographic worsening of intraparenchymal metastatic disease from his lung cancer.   No clear infiltrate to suggest infection.  No reversible cause seen thus far.   Anxiety/air hunger Cancer-related pain Widely metastatic lung cancer: Has not started treatment yet.  Poor candidate for treatment given his severe decompensation and decline, respiratory failure. P:  - Appreciate palliative care assistance - Continue precedex and dilaudid gtt with prn meds for comfort, pain, and anxiety - Bowel regimen in place for comfort/ avoid constipation - Will try to switch to salter HFNC for less noise (which he is sensitive to) if he will tolerate.  Also have prn meds to help with additional dyspnea.  Anticipate hospital passing.   Best practice:  Diet: regular Pain/Anxiety/Delirium protocol (if indicated): as above VAP protocol (if indicated): N/A DVT prophylaxis: n/a GI prophylaxis: n/a Glucose control: N/A Mobility/Activity: bedrest     Code Status: DNR/ DNI Family Communication:  wife and nephew updated at bedside  Disposition: ICU   Labs   CBC: Recent Labs  Lab 11/02/2020 1826 10/27/20 0250 10/28/20 0249 10/29/20 0244 10/30/20 0300  WBC 16.2* 12.1* 28.7* 18.6* 17.6*  NEUTROABS 12.9*  --   --   --   --   HGB 16.9 15.2 14.1 13.2 14.7  HCT 49.2 45.1 41.6 39.2 44.8  MCV 87.7 88.8 89.7 88.7 91.2  PLT 195 146* 192 218 242    Basic Metabolic Panel: Recent Labs  Lab 10/11/2020 1826 10/27/20 0250 10/29/20 0244  NA 124* 126* 136  K 4.4 5.1 4.7  CL 86* 95* 100  CO2 22 19* 27  GLUCOSE 135* 197* 126*  BUN 11 11 24*  CREATININE 0.95 0.96 0.78  CALCIUM 9.1 8.5* 9.0  MG  --  2.4  --    GFR: Estimated Creatinine Clearance: 92.2 mL/min (by C-G formula based on SCr of 0.78 mg/dL). Recent Labs  Lab 10/15/2020 1826 10/28/2020 2012 10/27/20 0250 10/27/20 0712 10/28/20 0249 10/29/20 0244 10/30/20 0300  PROCALCITON  --  0.15  --   --   --   --   --   WBC 16.2*  --  12.1*  --  28.7* 18.6* 17.6*  LATICACIDVEN 2.7* 3.2* 5.4* 3.6*  --   --   --     Liver Function  Tests: Recent Labs  Lab 10/18/2020 1826  AST 27  ALT 19  ALKPHOS 107  BILITOT 1.3*  PROT 7.4  ALBUMIN 3.5   No results for input(s): LIPASE, AMYLASE in the last 168 hours. No results for input(s): AMMONIA in the last 168 hours.  ABG    Component Value Date/Time   PHART 7.332 (L) 10/27/2020 0424   PCO2ART 36.9 10/27/2020 0424   PO2ART 87.2 10/27/2020 0424   HCO3 19.0 (L) 10/27/2020 0424   ACIDBASEDEF 5.8 (H) 10/27/2020 0424   O2SAT 95.3 10/27/2020 0424     Coagulation Profile: Recent Labs  Lab 10/29/2020 1826  INR 1.4*    Cardiac Enzymes: No results for input(s): CKTOTAL, CKMB, CKMBINDEX, TROPONINI in the last 168 hours.  HbA1C: No results found for: HGBA1C  CBG: No results for input(s): GLUCAP in the last 168 hours.     Kennieth Rad, ACNP [facility] 11/10/20, 11:49 AM  See Amion for pager If no response to pager, please call PCCM consult pager After 7:00 pm call Elink

## 2020-11-03 NOTE — Progress Notes (Signed)
Chaplain provided grief support and reflective listening to [place] wife and family friend.   [address] [place] Pager, [phone]

## 2020-11-03 DEATH — deceased

## 2020-11-05 ENCOUNTER — Other Ambulatory Visit: Payer: PRIVATE HEALTH INSURANCE

## 2020-11-05 ENCOUNTER — Ambulatory Visit: Payer: PRIVATE HEALTH INSURANCE | Admitting: Physician Assistant

## 2020-11-08 LAB — ACID FAST CULTURE WITH REFLEXED SENSITIVITIES (MYCOBACTERIA): Acid Fast Culture: NEGATIVE

## 2020-11-12 ENCOUNTER — Other Ambulatory Visit: Payer: PRIVATE HEALTH INSURANCE

## 2020-11-15 LAB — GUARDANT 360

## 2020-11-19 ENCOUNTER — Ambulatory Visit: Payer: PRIVATE HEALTH INSURANCE

## 2020-11-19 ENCOUNTER — Other Ambulatory Visit: Payer: PRIVATE HEALTH INSURANCE

## 2020-11-19 ENCOUNTER — Ambulatory Visit: Payer: PRIVATE HEALTH INSURANCE | Admitting: Physician Assistant

## 2020-12-17 ENCOUNTER — Ambulatory Visit: Payer: PRIVATE HEALTH INSURANCE | Admitting: Internal Medicine

## 2023-05-15 IMAGING — CT CT ANGIO CHEST
1 of 7 series · 17 of 36 positions shown · IV contrast (omnipaque)
Comparison: 09/09/2020, PET CT 10/19/2020

CLINICAL DATA: Non-small cell lung cancer, dyspnea, high
probability pulmonary embolism

EXAM:
CT ANGIOGRAPHY CHEST WITH CONTRAST
TECHNIQUE: Multidetector CT imaging of the chest was performed using the
standard protocol during bolus administration of intravenous
contrast. Multiplanar CT image reconstructions and MIPs were
obtained to evaluate the vascular anatomy.
CONTRAST:  80mL OMNIPAQUE IOHEXOL 350 MG/ML SOLN

[Series 5: thins · axial · 0.79mm/px · z∈[+1584,+1857]mm · 17 of 309 slices shown]
[im 18/309  lung]
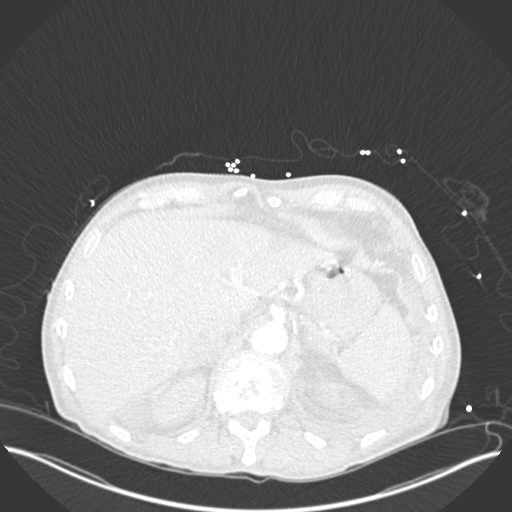
[im 35/309  mediastinal]
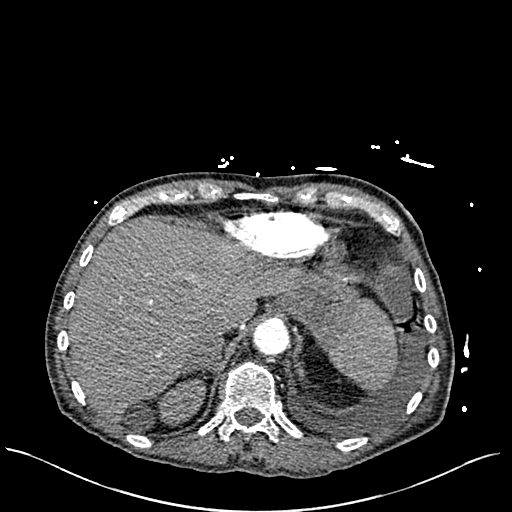
[im 52/309  lung]
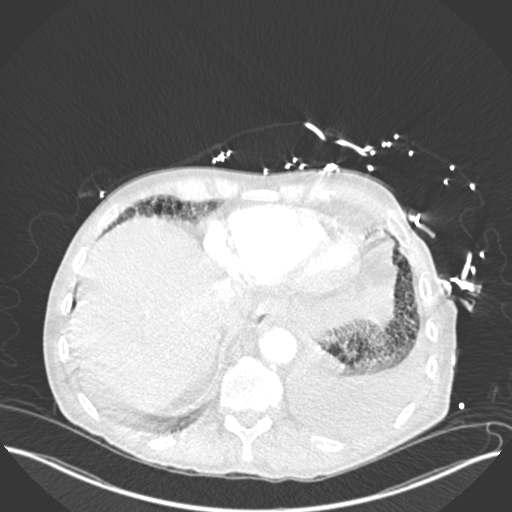
[im 69/309  mediastinal]
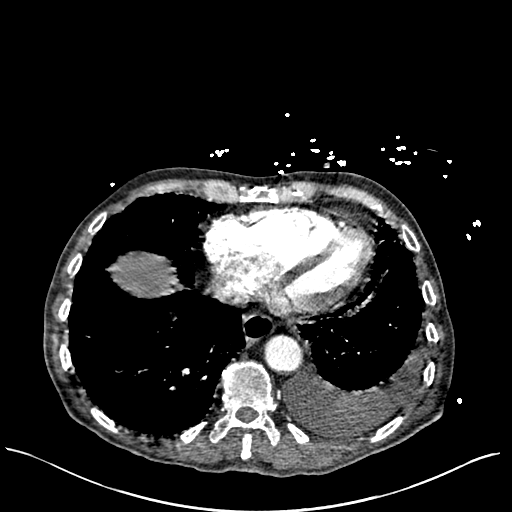
[im 86/309  lung]
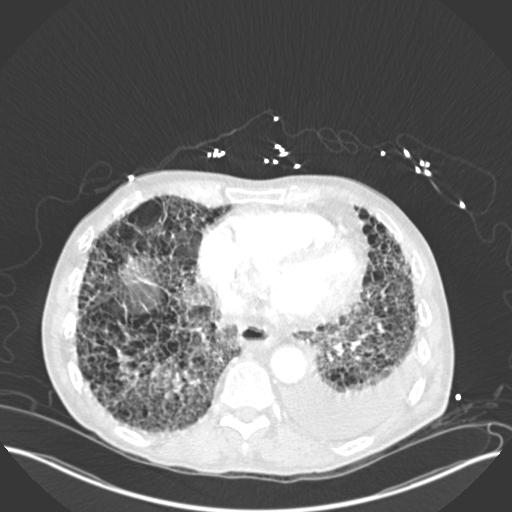
[im 103/309  mediastinal]
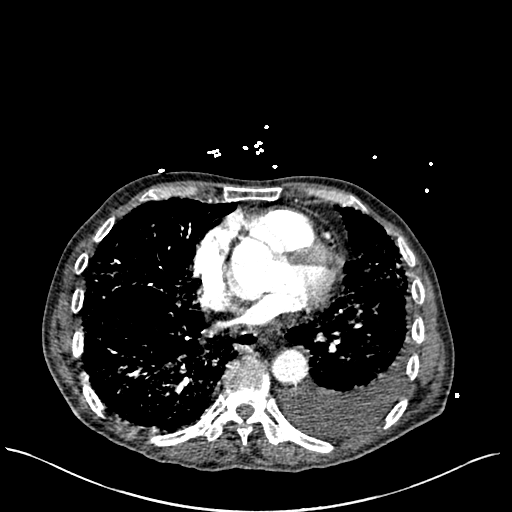
[im 120/309  lung]
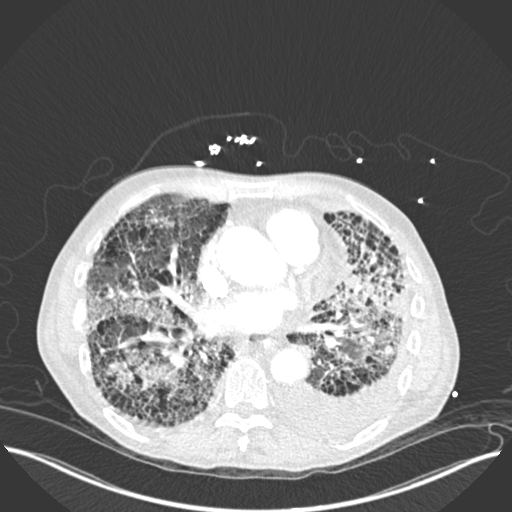
[im 137/309  mediastinal]
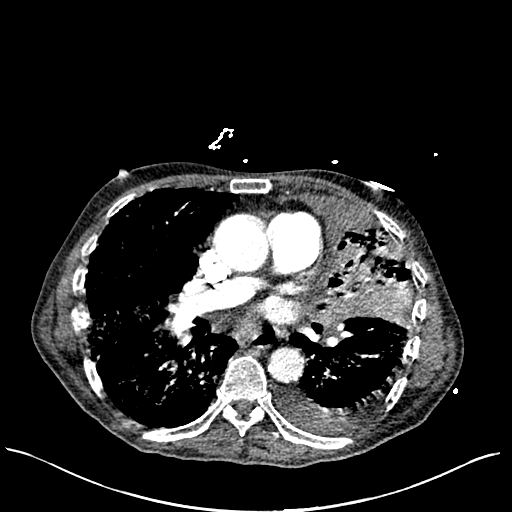
[im 155/309  lung]
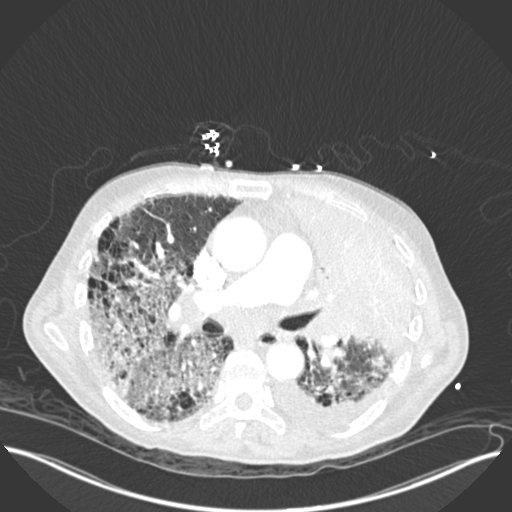
[im 172/309  mediastinal]
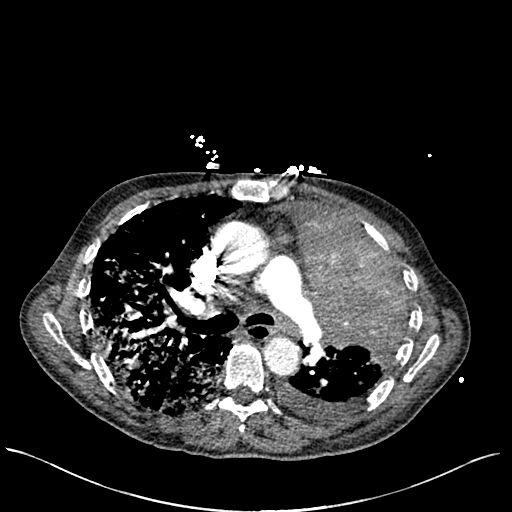
[im 189/309  lung]
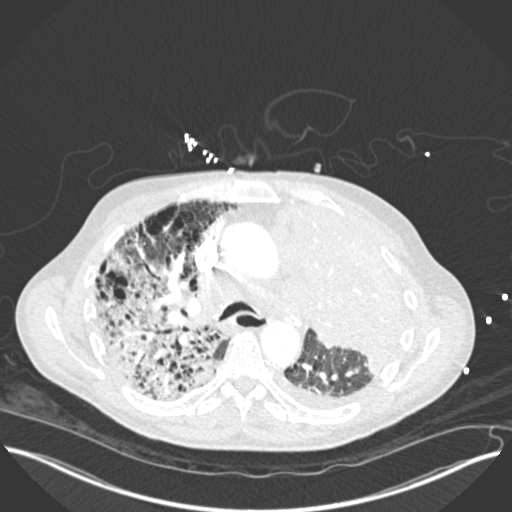
[im 206/309  mediastinal]
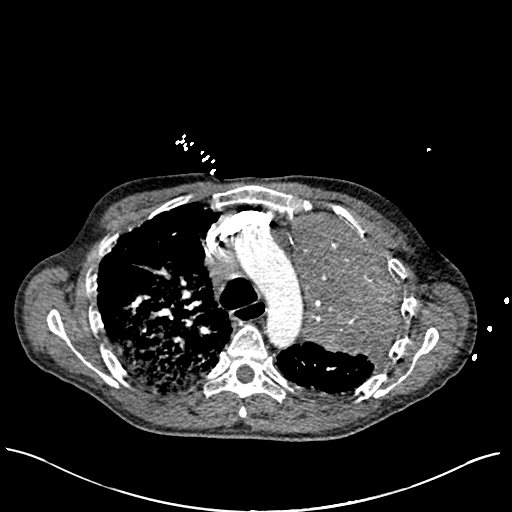
[im 223/309  lung]
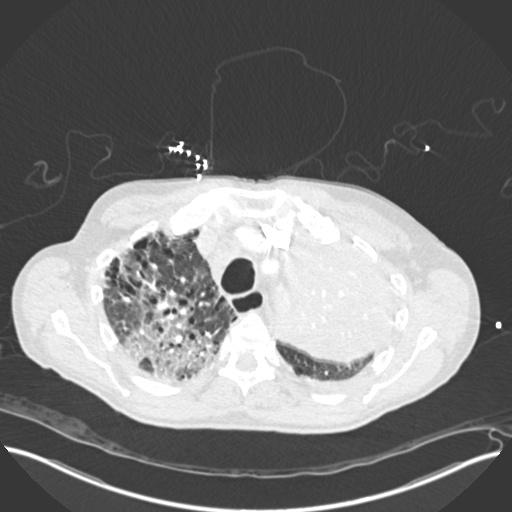
[im 240/309  mediastinal]
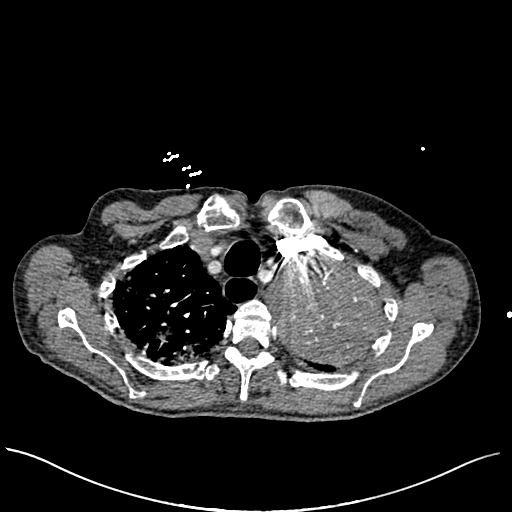
[im 257/309  lung]
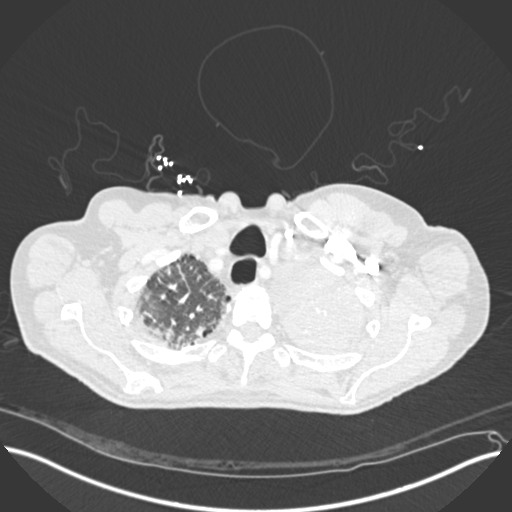
[im 274/309  mediastinal]
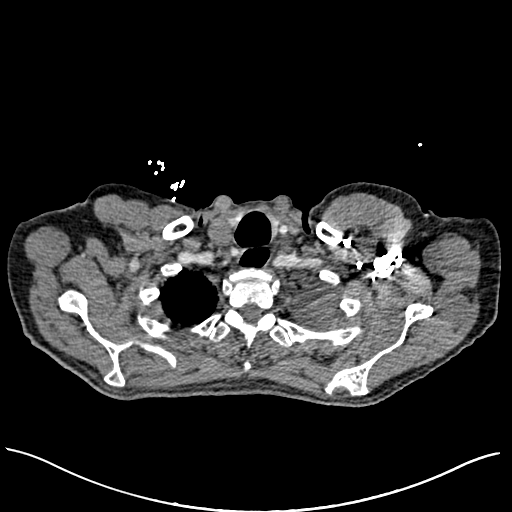
[im 291/309  lung]
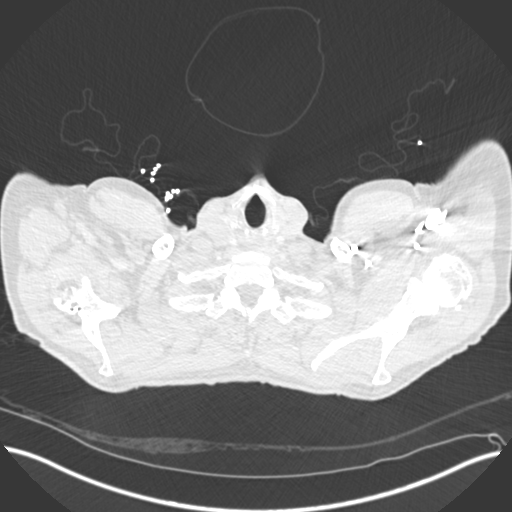

[17 of 36 positions shown; findings below may reference images not displayed]

FINDINGS: Cardiovascular: There is adequate opacification of the pulmonary
arterial tree. No intraluminal filling defect identified to suggest
acute pulmonary embolism. The central pulmonary arteries are
enlarged in keeping with changes of pulmonary arterial hypertension.
No significant coronary artery calcification. Global cardiac size
within normal limits. The the thoracic aorta is mildly dilated in
its ascending segment measuring 4.4 cm in greatest dimension.
Descending thoracic aorta is of normal caliber. Mild atherosclerotic
calcification within the thoracic aorta.

Mediastinum/Nodes: Pathologic left supraclavicular, mediastinal and
bilateral hilar adenopathy is again identified in keeping with
widespread nodal metastatic disease. The esophagus is patulous, but
is otherwise unremarkable. The visualized thyroid is unremarkable.

Lungs/Pleura: Since the prior examination, there has developed
segmental occlusion of the left upper lobe pulmonary bronchus, best
seen on image # 77/11, possibly related to intraluminal debris,
mucus, or extrinsic compression secondary to pathologic adenopathy,
with dense consolidation of the left upper lobe likely representing
postobstructive pneumonic consolidation. There is, additionally,
interval development of extensive consolidation and ground-glass
pulmonary infiltrate throughout the right lung which may be related
to acute infection or can relate to changes of immunotherapy in the
appropriate clinical setting. Moderate left pleural effusion has
developed in the interval since prior examination. Superimposed
extensive pulmonary fibrotic changes again seen with honeycombing at
the lung bases. No pneumothorax.

Upper Abdomen: Scattered low-attenuation lesions within the right
hepatic dome and left hepatic lobe more metabolically active on
prior PET-CT examination and likely represent multiple cysts. Known
hepatic metastases are not well appreciated on this examination. No
acute abnormality.

Musculoskeletal: No acute bone abnormality. Numerous osseous
metastases noted on prior PET CT examination are largely occult on
this examination though a single mixed lytic and sclerotic
metastasis noted within the lateral right seventh rib.

Review of the MIP images confirms the above findings.
IMPRESSION: No pulmonary embolism.

Interval occlusion of the left upper lobe pulmonary bronchus,
possibly related to extrinsic compression from adjacent pathologic
adenopathy or endoluminal debris or mucus. Dense consolidation of
the left upper lobe may reflect changes of a postobstructive
pneumonia.

Widespread extensive consolidation and ground-glass infiltrate
within the right lung, possibly reflecting changes of acute
infection.

Interval development of moderate left pleural effusion.

Stable changes of pulmonary fibrosis with extensive bibasilar
honeycombing.

Widespread mediastinal, hilar, and left supraclavicular adenopathy
in keeping with nodal metastatic disease. Known osseous and hepatic
metastases are not well appreciated on this examination and are
better visualized on prior PET-CT examination of 10/19/2020.

Dilation of the ascending thoracic aorta with maximal dimension of
4.4 cm. Recommend annual imaging followup by CTA or MRA. This
recommendation follows 0191
ACCF/AHA/AATS/ACR/ASA/SCA/ANTINORI/JESPER/ADHIKARI/VINES Guidelines for the
Diagnosis and Management of Patients with Thoracic Aortic Disease.
Circulation. 0191; 121: [id number]. Aortic aneurysm NOS ([id number].Q)

Aortic Atherosclerosis ([id number]-CRO.O).
